# Patient Record
Sex: Female | Born: 1940 | Race: White | Hispanic: No | Smoking: Former smoker
Health system: Southern US, Community
[De-identification: ages and names within clinical notes are randomized; demographics above are authoritative.]

## PROBLEM LIST (undated history)

## (undated) DIAGNOSIS — M1990 Unspecified osteoarthritis, unspecified site: Secondary | ICD-10-CM

## (undated) DIAGNOSIS — B029 Zoster without complications: Secondary | ICD-10-CM

## (undated) DIAGNOSIS — K2210 Ulcer of esophagus without bleeding: Secondary | ICD-10-CM

## (undated) DIAGNOSIS — K219 Gastro-esophageal reflux disease without esophagitis: Secondary | ICD-10-CM

## (undated) DIAGNOSIS — J189 Pneumonia, unspecified organism: Secondary | ICD-10-CM

## (undated) DIAGNOSIS — K2270 Barrett's esophagus without dysplasia: Secondary | ICD-10-CM

## (undated) DIAGNOSIS — K449 Diaphragmatic hernia without obstruction or gangrene: Secondary | ICD-10-CM

## (undated) DIAGNOSIS — R112 Nausea with vomiting, unspecified: Secondary | ICD-10-CM

## (undated) DIAGNOSIS — R319 Hematuria, unspecified: Secondary | ICD-10-CM

## (undated) DIAGNOSIS — I10 Essential (primary) hypertension: Secondary | ICD-10-CM

## (undated) DIAGNOSIS — K900 Celiac disease: Secondary | ICD-10-CM

## (undated) DIAGNOSIS — D649 Anemia, unspecified: Secondary | ICD-10-CM

## (undated) DIAGNOSIS — R7303 Prediabetes: Secondary | ICD-10-CM

## (undated) DIAGNOSIS — I00 Rheumatic fever without heart involvement: Secondary | ICD-10-CM

## (undated) DIAGNOSIS — T4145XA Adverse effect of unspecified anesthetic, initial encounter: Secondary | ICD-10-CM

## (undated) DIAGNOSIS — Z9889 Other specified postprocedural states: Secondary | ICD-10-CM

## (undated) DIAGNOSIS — N321 Vesicointestinal fistula: Secondary | ICD-10-CM

## (undated) DIAGNOSIS — R011 Cardiac murmur, unspecified: Secondary | ICD-10-CM

## (undated) DIAGNOSIS — K5792 Diverticulitis of intestine, part unspecified, without perforation or abscess without bleeding: Secondary | ICD-10-CM

## (undated) HISTORY — PX: TONSILLECTOMY: SUR1361

## (undated) HISTORY — DX: Zoster without complications: B02.9

## (undated) HISTORY — DX: Barrett's esophagus without dysplasia: K22.70

## (undated) HISTORY — PX: CARPAL TUNNEL RELEASE: SHX101

## (undated) HISTORY — PX: APPENDECTOMY: SHX54

## (undated) HISTORY — DX: Diaphragmatic hernia without obstruction or gangrene: K44.9

## (undated) HISTORY — PX: KNEE ARTHROSCOPY: SUR90

## (undated) HISTORY — PX: BUNIONECTOMY: SHX129

---

## 1964-01-11 DIAGNOSIS — T8859XA Other complications of anesthesia, initial encounter: Secondary | ICD-10-CM

## 1964-01-11 HISTORY — DX: Other complications of anesthesia, initial encounter: T88.59XA

## 1998-11-27 ENCOUNTER — Other Ambulatory Visit: Admission: RE | Admit: 1998-11-27 | Discharge: 1998-11-27 | Payer: Self-pay | Admitting: *Deleted

## 1999-04-13 ENCOUNTER — Ambulatory Visit (HOSPITAL_BASED_OUTPATIENT_CLINIC_OR_DEPARTMENT_OTHER): Admission: RE | Admit: 1999-04-13 | Discharge: 1999-04-13 | Payer: Self-pay | Admitting: Orthopedic Surgery

## 1999-04-13 ENCOUNTER — Encounter (INDEPENDENT_AMBULATORY_CARE_PROVIDER_SITE_OTHER): Payer: Self-pay | Admitting: *Deleted

## 2000-07-31 ENCOUNTER — Other Ambulatory Visit: Admission: RE | Admit: 2000-07-31 | Discharge: 2000-07-31 | Payer: Self-pay | Admitting: *Deleted

## 2001-01-18 ENCOUNTER — Encounter: Payer: Self-pay | Admitting: Obstetrics and Gynecology

## 2001-01-18 ENCOUNTER — Encounter: Admission: RE | Admit: 2001-01-18 | Discharge: 2001-01-18 | Payer: Self-pay | Admitting: Obstetrics and Gynecology

## 2001-11-22 ENCOUNTER — Other Ambulatory Visit: Admission: RE | Admit: 2001-11-22 | Discharge: 2001-11-22 | Payer: Self-pay | Admitting: Obstetrics and Gynecology

## 2001-12-04 ENCOUNTER — Encounter: Admission: RE | Admit: 2001-12-04 | Discharge: 2001-12-04 | Payer: Self-pay | Admitting: Obstetrics and Gynecology

## 2001-12-04 ENCOUNTER — Encounter: Payer: Self-pay | Admitting: Obstetrics and Gynecology

## 2002-02-20 ENCOUNTER — Encounter: Payer: Self-pay | Admitting: Gastroenterology

## 2002-04-29 ENCOUNTER — Encounter: Payer: Self-pay | Admitting: *Deleted

## 2002-04-29 ENCOUNTER — Encounter: Admission: RE | Admit: 2002-04-29 | Discharge: 2002-04-29 | Payer: Self-pay | Admitting: *Deleted

## 2002-04-30 ENCOUNTER — Ambulatory Visit (HOSPITAL_BASED_OUTPATIENT_CLINIC_OR_DEPARTMENT_OTHER): Admission: RE | Admit: 2002-04-30 | Discharge: 2002-04-30 | Payer: Self-pay | Admitting: Orthopaedic Surgery

## 2002-11-28 ENCOUNTER — Other Ambulatory Visit: Admission: RE | Admit: 2002-11-28 | Discharge: 2002-11-28 | Payer: Self-pay | Admitting: Obstetrics and Gynecology

## 2002-12-03 ENCOUNTER — Encounter: Admission: RE | Admit: 2002-12-03 | Discharge: 2002-12-03 | Payer: Self-pay | Admitting: Obstetrics and Gynecology

## 2002-12-11 ENCOUNTER — Ambulatory Visit (HOSPITAL_COMMUNITY): Admission: RE | Admit: 2002-12-11 | Discharge: 2002-12-11 | Payer: Self-pay | Admitting: Gastroenterology

## 2002-12-25 ENCOUNTER — Encounter: Payer: Self-pay | Admitting: Gastroenterology

## 2002-12-25 ENCOUNTER — Ambulatory Visit (HOSPITAL_COMMUNITY): Admission: RE | Admit: 2002-12-25 | Discharge: 2002-12-25 | Payer: Self-pay | Admitting: Gastroenterology

## 2003-10-08 ENCOUNTER — Ambulatory Visit (HOSPITAL_COMMUNITY): Admission: RE | Admit: 2003-10-08 | Discharge: 2003-10-08 | Payer: Self-pay | Admitting: Gastroenterology

## 2003-12-22 ENCOUNTER — Encounter: Admission: RE | Admit: 2003-12-22 | Discharge: 2003-12-22 | Payer: Self-pay | Admitting: Obstetrics and Gynecology

## 2003-12-23 ENCOUNTER — Other Ambulatory Visit: Admission: RE | Admit: 2003-12-23 | Discharge: 2003-12-23 | Payer: Self-pay | Admitting: Obstetrics and Gynecology

## 2004-01-27 ENCOUNTER — Encounter: Admission: RE | Admit: 2004-01-27 | Discharge: 2004-01-27 | Payer: Self-pay | Admitting: Obstetrics and Gynecology

## 2004-02-17 ENCOUNTER — Encounter: Admission: RE | Admit: 2004-02-17 | Discharge: 2004-02-17 | Payer: Self-pay | Admitting: Obstetrics and Gynecology

## 2004-03-02 ENCOUNTER — Ambulatory Visit: Payer: Self-pay | Admitting: Gastroenterology

## 2004-03-03 ENCOUNTER — Ambulatory Visit: Payer: Self-pay | Admitting: Gastroenterology

## 2004-12-23 ENCOUNTER — Other Ambulatory Visit: Admission: RE | Admit: 2004-12-23 | Discharge: 2004-12-23 | Payer: Self-pay | Admitting: Obstetrics and Gynecology

## 2005-03-31 ENCOUNTER — Ambulatory Visit: Payer: Self-pay | Admitting: Gastroenterology

## 2005-10-10 ENCOUNTER — Encounter: Payer: Self-pay | Admitting: Gastroenterology

## 2005-11-15 ENCOUNTER — Ambulatory Visit: Payer: Self-pay | Admitting: Gastroenterology

## 2005-12-05 ENCOUNTER — Ambulatory Visit: Payer: Self-pay | Admitting: Gastroenterology

## 2005-12-05 ENCOUNTER — Encounter (INDEPENDENT_AMBULATORY_CARE_PROVIDER_SITE_OTHER): Payer: Self-pay | Admitting: *Deleted

## 2006-01-02 ENCOUNTER — Encounter: Payer: Self-pay | Admitting: Gastroenterology

## 2006-01-24 ENCOUNTER — Emergency Department (HOSPITAL_COMMUNITY): Admission: EM | Admit: 2006-01-24 | Discharge: 2006-01-24 | Payer: Self-pay | Admitting: Emergency Medicine

## 2006-02-08 ENCOUNTER — Inpatient Hospital Stay (HOSPITAL_COMMUNITY): Admission: EM | Admit: 2006-02-08 | Discharge: 2006-02-15 | Payer: Self-pay | Admitting: Emergency Medicine

## 2006-04-19 ENCOUNTER — Encounter: Admission: RE | Admit: 2006-04-19 | Discharge: 2006-04-19 | Payer: Self-pay | Admitting: Family Medicine

## 2007-04-23 ENCOUNTER — Encounter: Admission: RE | Admit: 2007-04-23 | Discharge: 2007-04-23 | Payer: Self-pay | Admitting: Family Medicine

## 2008-04-08 ENCOUNTER — Other Ambulatory Visit: Admission: RE | Admit: 2008-04-08 | Discharge: 2008-04-08 | Payer: Self-pay | Admitting: Family Medicine

## 2008-05-08 ENCOUNTER — Encounter: Admission: RE | Admit: 2008-05-08 | Discharge: 2008-05-08 | Payer: Self-pay | Admitting: Family Medicine

## 2008-12-03 ENCOUNTER — Inpatient Hospital Stay (HOSPITAL_COMMUNITY): Admission: EM | Admit: 2008-12-03 | Discharge: 2008-12-05 | Payer: Self-pay | Admitting: Internal Medicine

## 2008-12-03 ENCOUNTER — Encounter (INDEPENDENT_AMBULATORY_CARE_PROVIDER_SITE_OTHER): Payer: Self-pay | Admitting: *Deleted

## 2008-12-03 ENCOUNTER — Encounter: Payer: Self-pay | Admitting: Emergency Medicine

## 2008-12-03 ENCOUNTER — Ambulatory Visit: Payer: Self-pay | Admitting: Diagnostic Radiology

## 2008-12-16 ENCOUNTER — Telehealth: Payer: Self-pay | Admitting: Gastroenterology

## 2008-12-17 ENCOUNTER — Ambulatory Visit: Payer: Self-pay | Admitting: Gastroenterology

## 2008-12-17 ENCOUNTER — Encounter (INDEPENDENT_AMBULATORY_CARE_PROVIDER_SITE_OTHER): Payer: Self-pay | Admitting: *Deleted

## 2008-12-17 DIAGNOSIS — R933 Abnormal findings on diagnostic imaging of other parts of digestive tract: Secondary | ICD-10-CM

## 2008-12-17 DIAGNOSIS — K2270 Barrett's esophagus without dysplasia: Secondary | ICD-10-CM

## 2008-12-17 DIAGNOSIS — K5732 Diverticulitis of large intestine without perforation or abscess without bleeding: Secondary | ICD-10-CM

## 2008-12-17 LAB — CONVERTED CEMR LAB: IgA: 244 mg/dL (ref 68–378)

## 2008-12-19 LAB — CONVERTED CEMR LAB: Tissue Transglutaminase Ab, IgA: 0.5 units (ref <7)

## 2009-01-16 ENCOUNTER — Ambulatory Visit: Payer: Self-pay | Admitting: Gastroenterology

## 2009-01-20 ENCOUNTER — Encounter: Payer: Self-pay | Admitting: Gastroenterology

## 2009-08-20 ENCOUNTER — Encounter: Admission: RE | Admit: 2009-08-20 | Discharge: 2009-08-20 | Payer: Self-pay | Admitting: Family Medicine

## 2010-02-09 NOTE — Procedures (Signed)
Summary: Upper Endoscopy  Patient: [name] Note: All result statuses are Final unless otherwise noted.  Tests: (1) Upper Endoscopy (EGD)   EGD Upper Endoscopy       [facility].     [place], [place]  [id number]           ENDOSCOPY PROCEDURE REPORT           PATIENT:  Kara, Velazquez  MR#:  [id number]     BIRTHDATE:  1940-04-21, 34 yrs. old  GENDER:  female           ENDOSCOPIST:  Norberto Sorenson T. Fuller Plan, MD, [facility]           PROCEDURE DATE:  01/16/2009     PROCEDURE:  EGD with biopsy     ASA CLASS:  Class II     INDICATIONS:  h/o Barrett's Esophagus           MEDICATIONS:  There was residual sedation effect present from     prior procedure, Versed 3 mg IV     TOPICAL ANESTHETIC:  Exactacain Spray           DESCRIPTION OF PROCEDURE:   After the risks benefits and     alternatives of the procedure were thoroughly explained, informed     consent was obtained.  The LB GIF-H180 [id number] endoscope was     introduced through the mouth and advanced to the second portion of     the duodenum, without limitations.  The instrument was slowly     withdrawn as the mucosa was fully examined.     <<PROCEDUREIMAGES>>           Barrett's esophagus was found in the distal esophagus.  It     measured 1-2 cm in length. The duodenal bulb was normal in     appearance, as was the postbulbar duodenum.  The stomach was     entered and closely examined. The pylorus, antrum, angularis, and     lesser curvature were well visualized, including a retroflexed     view of the cardia and fundus. The stomach wall was normally     distensable. The scope passed easily through the pylorus into the     duodenum.  Retroflexed views revealed a hiatal hernia, small.  The     scope was then withdrawn from the patient and the procedure     completed.           COMPLICATIONS:  None           ENDOSCOPIC IMPRESSION:     1) Barrett's esophagus     2) Small hiatal hernia        RECOMMENDATIONS:     1) anti-reflux regimen     2) await pathology results     3) continue PPI     4) Repeat endoscopy in 3 years if no dysplasia           Ozias Dicenzo T. Fuller Plan, MD, [facility]           CC:  Cari Caraway, MD           n.     Lorrin MaisPricilla Riffle. Aleea Hendry at 01/16/2009 03:19 PM           [name], [phone]  Note: An exclamation mark (!) indicates a result that was not dispersed into the flowsheet. Document Creation Date: 01/16/2009 3:19 PM _______________________________________________________________________  Marland Kitchen  1) Order result status: Final Collection or observation date-time: 01/16/2009 15:09 Requested date-time:  Receipt date-time:  Reported date-time:  Referring Physician:   Ordering Physician: Lucio Edward [phone]) Specimen Source:  Source: Tawanna Cooler Order Number: [id number] Lab site:   Appended Document: Upper Endoscopy     Procedures Next Due Date:    EGD: 01/2012

## 2010-02-09 NOTE — Letter (Signed)
Summary: Patient Notice-Endo Biopsy Results  [facility]  [place], [place] [address]   Phone: [phone]  Fax: [phone]        January 20, 2009 MRN: [id number]    [facility] [place], [place]  [address]    Dear [name],  I am pleased to inform you that the biopsies taken during your recent endoscopic examination did not show any evidence of cancer upon pathologic examination. The biopsies show mild inflammation.  Continue with the treatment plan as outlined on the day of your      exam.  You should have a repeat endoscopic examination for this problem              in 3 years.  Please call us if you are having persistent problems or have questions about your condition that have not been fully answered at this time.  Sincerely,  Ladene Artist MD [facility]  This letter has been electronically signed by your physician.  Appended Document: Patient Notice-Endo Biopsy Results Letter mailed 01.12.11

## 2010-02-09 NOTE — Procedures (Signed)
Summary: Colonoscopy  Patient: [name] Note: All result statuses are Final unless otherwise noted.  Tests: (1) Colonoscopy (COL)   COL Colonoscopy           DONE (C)     [facility].     [place],   [id number]           COLONOSCOPY PROCEDURE REPORT           PATIENT:  [name], [name]  MR#:  [id number]     BIRTHDATE:  10-18-1940, 68 yrs. old  GENDER:  female           ENDOSCOPIST:  Norberto Sorenson T. Fuller Plan, MD, [place]           PROCEDURE DATE:  01/16/2009     PROCEDURE:  Colonoscopy [address]     ASA CLASS:  Class II     INDICATIONS:  1) abnormal CT of abdomen  2) diverticulitis           MEDICATIONS:   Fentanyl 100 mcg IV, Versed 7 mg IV           DESCRIPTION OF PROCEDURE:   After the risks benefits and     alternatives of the procedure were thoroughly explained, informed     consent was obtained.  Digital rectal exam was performed and     revealed no abnormalities.   The LB [id number]-[id number] endoscope     was introduced through the anus and advanced to the cecum, which     was identified by both the appendix and ileocecal valve, without     limitations.  The quality of the prep was excellent, using     MoviPrep.  The instrument was then slowly withdrawn as the colon     was fully examined.     <<PROCEDUREIMAGES>>           FINDINGS:  Moderate diverticulosis was found in the sigmoid colon     with haustral thickening, spasm and mild narrowing  A normal     appearing cecum, ileocecal valve, and appendiceal orifice were     identified. The ascending, hepatic flexure, transverse, splenic     flexure, descending colon, and rectum appeared unremarkable.     Retroflexed views in the rectum revealed no abnormalities. The     time to cecum =  3  minutes. The scope was then withdrawn (time =     10  min) from the patient and the procedure completed.           COMPLICATIONS:  None           ENDOSCOPIC IMPRESSION:     1) Moderate diverticulosis in the sigmoid  colon     2) Normal colon           RECOMMENDATIONS:     1) high fiber diet     2) Continue current colorectal screening recommendations for     "routine risk" patients with a repeat colonoscopy in 10 years.           Pricilla Riffle. Fuller Plan, MD, [facility]           CC: Cari Caraway, MD           n.     REVISED:  01/20/2009 04:07 PM     eSIGNED:   Norberto Sorenson T. Riot Barrick at 01/20/2009 04:07 PM           [name], [phone]  Note: An exclamation mark Marland Kitchen)  indicates a result that was not dispersed into the flowsheet. Document Creation Date: 01/20/2009 4:08 PM _______________________________________________________________________  (1) Order result status: Final Collection or observation date-time: 01/16/2009 15:00 Requested date-time:  Receipt date-time:  Reported date-time:  Referring Physician:   Ordering Physician: Lucio Edward [phone]) Specimen Source:  Source: Tawanna Cooler Order Number: [id number] Lab site:   Appended Document: Colonoscopy     Procedures Next Due Date:    Colonoscopy: 01/2019

## 2010-04-14 LAB — URINALYSIS, MICROSCOPIC ONLY
Bilirubin Urine: NEGATIVE
Glucose, UA: NEGATIVE mg/dL
Nitrite: NEGATIVE
Specific Gravity, Urine: 1.041 — ABNORMAL HIGH (ref 1.005–1.030)
pH: 6 (ref 5.0–8.0)

## 2010-04-14 LAB — URINE CULTURE: Special Requests: NEGATIVE

## 2010-04-14 LAB — DIFFERENTIAL
Basophils Relative: 0 % (ref 0–1)
Eosinophils Absolute: 0 10*3/uL (ref 0.0–0.7)
Lymphs Abs: 0.8 10*3/uL (ref 0.7–4.0)
Monocytes Absolute: 0.8 10*3/uL (ref 0.1–1.0)
Monocytes Relative: 7 % (ref 3–12)
Neutrophils Relative %: 85 % — ABNORMAL HIGH (ref 43–77)

## 2010-04-14 LAB — COMPREHENSIVE METABOLIC PANEL
ALT: 13 U/L (ref 0–35)
Albumin: 3.1 g/dL — ABNORMAL LOW (ref 3.5–5.2)
Alkaline Phosphatase: 81 U/L (ref 39–117)
Calcium: 8.3 mg/dL — ABNORMAL LOW (ref 8.4–10.5)
GFR calc Af Amer: >60 mL/min (ref >60)
Glucose, Bld: 98 mg/dL (ref 70–99)
Potassium: 3.3 mEq/L — ABNORMAL LOW (ref 3.5–5.1)
Sodium: 136 mEq/L (ref 135–145)
Total Protein: 5.9 g/dL — ABNORMAL LOW (ref 6.0–8.3)

## 2010-04-14 LAB — CBC
HCT: 33.4 % — ABNORMAL LOW (ref 36.0–46.0)
Hemoglobin: 11.5 g/dL — ABNORMAL LOW (ref 12.0–15.0)
MCHC: 34.4 g/dL (ref 30.0–36.0)
MCV: 92.5 fL (ref 78.0–100.0)
Platelets: 220 10*3/uL (ref 150–400)
Platelets: 231 10*3/uL (ref 150–400)
RBC: 3.61 MIL/uL — ABNORMAL LOW (ref 3.87–5.11)
RDW: 13.5 % (ref 11.5–15.5)
RDW: 13.5 % (ref 11.5–15.5)
WBC: 11.4 10*3/uL — ABNORMAL HIGH (ref 4.0–10.5)
WBC: 7.9 10*3/uL (ref 4.0–10.5)

## 2010-04-14 LAB — BASIC METABOLIC PANEL
GFR calc Af Amer: >60 mL/min (ref >60)
GFR calc non Af Amer: >60 mL/min (ref >60)
Potassium: 3.2 mEq/L — ABNORMAL LOW (ref 3.5–5.1)
Sodium: 138 mEq/L (ref 135–145)

## 2010-04-14 LAB — LIPID PANEL
LDL Cholesterol: 56 mg/dL (ref 0–99)
Total CHOL/HDL Ratio: 1.9 RATIO
Triglycerides: 36 mg/dL (ref <150)
VLDL: 7 mg/dL (ref 0–40)

## 2010-04-14 LAB — HEMOGLOBIN A1C
Hgb A1c MFr Bld: 6.3 % — ABNORMAL HIGH (ref 4.6–6.1)
Mean Plasma Glucose: 134 mg/dL

## 2010-04-14 LAB — PHOSPHORUS: Phosphorus: 2.9 mg/dL (ref 2.3–4.6)

## 2010-05-28 NOTE — Discharge Summary (Signed)
[name], [name]                   ACCOUNT NO.:  [id number]   MEDICAL RECORD NO.:  [id number]          PATIENT TYPE:  INP   LOCATION:  1336                         FACILITY:  [facility]   PHYSICIAN:  Sheila Oats, M.D.DATE OF BIRTH:  August 15, 1940   DATE OF ADMISSION:  02/08/2006  DATE OF DISCHARGE:  02/15/2006                               DISCHARGE SUMMARY   DISCHARGE DIAGNOSES:  1. Bronchopneumonia, bilateral.  2. Malignant hypertension.  3. Hypokalemia.  4. Gastroesophageal reflux disease.  5. History of hypercholesterolemia.  6. History of celiac disease.   HISTORY OF PRESENT ILLNESS:  The patient is a pleasant, 70 year old,  white female with past medical history significant for GERD, celiac  disease, hypercholesterolemia and hypertension who presented with  complaints of cough with fever and vomiting.  She stated that she was in  her usual state of health until 4 days prior to admission when she began  coughing.  She also reported about 1-2 days prior to admission, she  developed fevers along with vomiting.  On the day prior to admission,  she went to the [facility] and was given oral antibiotics and  cough medications, but she was unable to keep her medications down.  Also on the morning of admission, she had a temperature of 103 and came  to the ER.  She also admitted to shortness of breath as well as URI  symptoms x1 day.   In the ER, the patient had a chest x-ray done which revealed findings  consistent with bronchopneumonia and an electrolyte imbalance was noted  on her chemistries.  She was admitted to the [facility] hospitalist service  for evaluation and management.   PHYSICAL EXAMINATION:  GENERAL:  An elderly, white female acutely ill-  appearing in no respiratory distress.  VITAL SIGNS:  Temperature 101.4 with a blood pressure 142/71, pulse 96,  respirations 16, O2 saturations 98%.  LUNGS:  Moderate air movement.  Bronchial breath sounds at the bases  with no wheezes.   The rest of her physical exam was reported to be within normal limits.   LABORATORY DATA AND X-RAY FINDINGS:  Chest x-ray showed peribronchial  thickening with streaking bilateral infiltrates, probable  bronchopneumonia per radiologist.   Her white cell count 5.5 with a hemoglobin of 10.6, hematocrit 30.7 and  platelet count of 186, neutrophil count 93%.  Sodium 134, potassium 3.9,  chloride 104, CO2 24, glucose 120, BUN 14, creatinine 0.8, calcium 8.4.  Total protein 5.8, albumin 3.5, AST 21, amylase 42, lipase 12.   HOSPITAL COURSE:  Problem 1.  BRONCHOPNEUMONIA, BILATERAL:  Upon  admission, the patient had blood cultures done and was empirically  placed on IV antibiotics.  By the next hospital day, the patient was  still febrile, so her antibiotics were adjusted to include coverage for  typical organisms.  The patient was also placed on antitussives while in  the hospital.  The patient's blood cultures did not grow any organisms.  She subsequently defervesced and her symptoms gradually improved with  shortness of breath resolved and her cough improved.  She  developed  diarrhea while on the antibiotics and stool studies were sent.  The  Clostridium difficile was negative and stool cultures of no growth up-to-  date.  The patient was initially placed on Questran for symptomatic  relief of the diarrhea and Lactinex was subsequently added.  The patient  has remained afebrile.  She is tolerating a p.o. diet well and she will  be discharged on oral antibiotics to follow up with her primary care  physician.   Problem 2.  MALIGNANT HYPERTENSION:  Upon admission, the patient was  restarted on her Toprol.  She indicated that she had previously also had  been on Avalide, but that was discontinued because it was thought that  it made her hypotensive.  Her blood pressures were monitored while she  was in the hospital and they were noted to be repeatedly elevated  reaching a  maximum of 206/102 which required some IV antihypertensives.  She was then started back on the Avalide initially the 150/12.5 mg dose.  The patient's blood pressures continued to be uncontrolled even with  this and so the dose was subsequently increased to the 300/12.5 mg dose.  She will be monitored overnight and if her blood pressure control  remains adequate, she will be discharged on her Toprol 50 mg daily as  well as the Avalide 300/12.5 mg daily.  She is to follow up with her  primary care physician for further blood pressure monitoring and  adjustments of her blood pressures medications for optimal blood  pressure control.   Problem 3.  HYPOKALEMIA:  Her potassium was replaced during her hospital  stay.   Problem 4.  HYPERCHOLESTEROLEMIA:  The patient was maintained on Zocor  during her hospital stay.   Problem 5.  HISTORY OF CELIAC DISEASE:  The patient was placed on a  gluten-free diet during her hospital stay.   Problem 6.  GASTROESOPHAGEAL REFLUX DISEASE:  The patient was maintained  on a PPI during her hospital stay.   DISCHARGE MEDICATIONS:  1. Augmentin 875 mg p.o. b.i.d. x1 week.  2. Avalide 300/12.5 mg p.o. q.a.m.  3. Toprol XL 50 mg p.o. q.p.m.  4. Tessalon Perles 100 mg p.o. t.i.d. p.r.n.  5. Lactinex one p.o. b.i.d.  6. Questran 4 mg p.o. b.i.d. p.r.n.  7. Tussionex 5 mL p.o. q.12 h. p.r.n. cough.  8. Continue preadmission medications as instructed.   FOLLOW UP:  Dr. Leitha Bleak in 1 week.   CONDITION ON DISCHARGE:  Stable.      Sheila Oats, M.D.  Electronically Signed     ACV/MEDQ  D:  02/14/2006  T:  02/14/2006  Job:  [id number]

## 2010-05-28 NOTE — Assessment & Plan Note (Signed)
[facility]                           GASTROENTEROLOGY OFFICE NOTE   CARLENE, BICKLEY                          MRN:          [id number]  DATE:11/15/2005                            DOB:          04-20-1940    Ms. Reader returns complaining of episodic diarrhea.  She states that about  once a month she will have cramping lower abdominal pain with watery  diarrhea.  She occasionally has vomiting.  She notes lower abdominal  discomfort, which may last for several days following a bout of diarrhea and  vomiting.  She had a similar episode last week, which was associated with  small amounts of mucus and blood per rectum, which was unusual for her.  Her  reflux symptoms appear to be well-controlled on Protonix 40 mg b.i.d.  She  notes no change in stool caliber, weight loss or rectal pain.   Current medications listed on the chart-updated & reviewed.   MEDICATION ALLERGIES:  MOTRIN, ACE INHIBITORS, LEVAQUIN, CIPRO.   PHYSICAL EXAMINATION:  GENERAL:  No acute distress.  VITAL SIGNS:  Weight 150 pounds.  Blood pressure is 142/70, pulse 80 and  regular.  HEENT:  Anicteric sclerae.  Oropharynx clear.  CHEST:  Clear to auscultation bilaterally.  CARDIAC:  Regular rate and rhythm without murmurs.  ABDOMEN:  Soft.  Minimal lower abdominal tenderness to deep palpation, no  rebound or guarding.  No palpable organomegaly, masses or hernias.  Normoactive bowel sounds.   ASSESSMENT AND PLAN:  1. Presumed irritable bowel syndrome with likely a benign anorectal source      of bleeding.  Most recent colonoscopy in February 2004 showing only      diverticulosis.  Need to exclude colitis, proctitis and colorectal      neoplasms.  She is to maintain a high-fiber diet with adequate fluid      intake.  She is encouraged to use Robinul Forte b.i.d. prn more      frequently.  She uses it very infrequently at this time.  Risks,      benefits, and alternatives of colonoscopy  with possible biopsy,      possible polypectomy and possible destruction of internal hemorrhoids      discussed with the patient, and she consents to proceed.  This will be      scheduled electively.  2. Gastroesophageal reflux disease with a history of erosive esophagitis.      Maintain Protonix 40 mg b.i.d. along with standard antireflux measures.     Pricilla Riffle. Fuller Plan, MD, [facility]  Electronically Signed    MTS/MedQ  DD: 11/16/2005  DT: 11/17/2005  Job #: [id number]

## 2010-05-28 NOTE — Op Note (Signed)
NAME:  LYNNELL, FIUMARA                             ACCOUNT NO.:  [id number]   MEDICAL RECORD NO.:  [id number]                   PATIENT TYPE:  AMB   LOCATION:  DSC                                  FACILITY:  [facility]   PHYSICIAN:  Vonna Kotyk. Durward Fortes, M.D.            DATE OF BIRTH:  06/18/1940   DATE OF PROCEDURE:  04/30/2002  DATE OF DISCHARGE:                                 OPERATIVE REPORT   PREOPERATIVE DIAGNOSIS:  1. Tear lateral meniscus, right knee.  2. 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   POSTOPERATIVE DIAGNOSIS:  1. Tear lateral meniscus, right knee.  2. 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   OPERATION PERFORMED:  1. Arthroscopic partial lateral meniscectomy.  2. Arthroscopic shaving of patellofemoral joint, medial compartment and     lateral compartment.   SURGEON:  Vonna Kotyk. Durward Fortes, M.D.   ANESTHESIA:  IV sedation, local 1% Xylocaine with epinephrine.   COMPLICATIONS:  None.   INDICATIONS FOR PROCEDURE:  The patient is a 70 year old female who was  visiting family in [place] this weekend.  She was holding a grandchild and  she had a twisting injury to her right knee.  She was uncomfortable  throughout her drive home, was seen by a primary care physician at the end  of the weekend who suspected a tear of the lateral meniscus.  An MRI scan  was performed yesterday morning with evidence of a displaced tear of the  lateral meniscus.  She was seen in the office yesterday afternoon confirming  the diagnosis by MR scan with inability to fully extend the knee with a  positive effusion with lateral joint pain.  She is now to have arthroscopic  evaluation.   DESCRIPTION OF PROCEDURE:  With the patient comfortable on the operating  table and under IV sedation, the  right lower extremity was placed in a thigh  holder.  The leg was then prepped with DuraPrep from the thigh holder to the  ankle.  Sterile draping was performed.  Arthroscopic portals were  established medially and laterally.  The knee had been prepped, had been  blocked  for the arthroscopic portals preoperatively.   There was a minimal effusion.  Diagnostic arthroscopy revealed obliteration  of the lateral compartment with a displaced tear of the lateral meniscus  extending from about the junction of the posterior and middle thirds all the  way to the anterior horn.  There was bleeding in the fatty tissue  anteriorly.  I inserted an ArthroCare wand to obtain hemostasis.  I then  removed the meniscus from its torn portion along the anterior horn through  its posterior attachment at the juncture of the middle and posterior thirds.  I then carefully probed the remaining rim.  I did not see any evidence of  further displaced fragments.  I carefully coagulated any bleeding areas with  the ArthroCare wand.  There was still some fraying of the posterior third of  the meniscus and this was carefully removed leaving the majority of the  posterior third.  There was chondromalacia of the femoral condyle more so  than on the lateral tibial plateau and the gutter was clear without evidence  of any displaced fragments.   There were osteoarthritic spurs along the lateral femoral condyle.  The ACL  appeared to be intact.  The medial compartment revealed loose articular  cartilage in the mid weightbearing surface of the femoral condyle.  This was  debrided and then heat stabilized with the ArthroCare wand.  The meniscus  appeared to be intact except for some fraying of the very anterior horn of  the medial meniscus.  The superior pouch revealed mild synovitis.  There was  considerable chondromalacia of the patella mostly along all facets.  There  were areas of exposed subchondral bone and any loose  edges were shaved.  The  joint was then copiously irrigated with saline solution.  I did establish a  third portal for arthroscopic lavage.  All three were infiltrated with 0.25%  Marcaine with epinephrine and left open.  Sterile bulky dressing was applied  followed by an Ace bandage.   PLAN:  She takes an aspirin a day, Percocet for pain, office one week.                                               Vonna Kotyk. Durward Fortes, M.D.    PWW/MEDQ  D:  04/30/2002  T:  04/30/2002  Job:  [id number]

## 2010-05-28 NOTE — H&P (Signed)
NAMETYLIN, STRADLEY                   ACCOUNT NO.:  [id number]   MEDICAL RECORD NO.:  [id number]          PATIENT TYPE:  EMS   LOCATION:  ED                           FACILITY:  [facility]   PHYSICIAN:  Sheila Oats, M.D.DATE OF BIRTH:  [birth year]   DATE OF ADMISSION:  02/08/2006  DATE OF DISCHARGE:                              HISTORY & PHYSICAL   PRIMARY CARE PHYSICIAN:  Dr. Cari Caraway   CHIEF COMPLAINT:  Cough with fever and vomiting.   HISTORY OF PRESENT ILLNESS:  The patient is a pleasant 70 year old white  female with past medical history significant for GERD, celiac disease,  hypercholesterolemia and hypertension who presents with the above  complaints.  She states that she was in her usual state of health until  about 4 days ago when she began coughing.  She reports that initially  the cough was nonproductive, but for the past 1-2 just productive of  clear phlegm.  She states that on the day prior to admission she began  vomiting and also developed fevers.  They went to the [facility] the day prior to admission and she was given oral amoxicillin and  cough medication but she was unable to keep the medications down  secondary to the vomiting and also had a temperature up to 103 this  morning, and therefore came to the ER.  The patient also admits to  shortness of breath x1 day as well as URI symptoms including a sore  throat.  She denies PND, leg swelling, chest pain, dysuria, diarrhea,  hematochezia, and no melena.   The patient was seen in the ER and a chest x-ray showed findings  consistent with bronchopneumonia and her chemistries revealed  electrolyte imbalances.  She is admitted to the [facility] for further evaluation and management.   PAST MEDICAL HISTORY:  As stated above.   MEDICATIONS:  Protonix b.i.d., simvastatin, Toprol-XL, Amoxi, and  Hydromet.  The patient does not know the doses of any of her  medications.   ALLERGIES:   LEVAQUIN, MOTRIN, ACE INHIBITORS.  She also has food  allergies including GLUTEN, WHEAT, and MILK.   SOCIAL HISTORY:  She quit tobacco about 45 years ago, smoked about 4  years while in college.  She denies alcohol.   FAMILY HISTORY:  Mother had hypertension and stroke.  Father had an MI  at age 37.   REVIEW OF SYSTEMS:  As per HPI.  Other review of systems negative.   PHYSICAL EXAMINATION:  GENERAL:  The patient is an elderly white female,  acutely-ill-appearing, in no respiratory distress.  VITAL SIGNS:  Temperature 101.4, blood pressure 142/71, pulse of 96,  respiratory rate of 16, O2 saturation of 98%.  HEENT:  PERRL, EOMI, slightly dry mucous membranes.  No oral exudates.  Sclerae anicteric.  NECK:  Supple, no adenopathy, no JVD, and no thyromegaly.  LUNGS:  Moderate air movement, bronchial breath sounds at the bases, no  wheezes.  CARDIOVASCULAR:  Regular rate and rhythm.  Normal S1, S2.  ABDOMEN:  Mild diffuse tenderness, no rebound,  bowel sounds present,  nondistended, no organomegaly and no masses palpable.  EXTREMITIES:  No cyanosis and no edema.  NEUROLOGIC:  She is alert and oriented x3.  Cranial nerves II-XII  grossly intact.  Nonfocal exam.   LABORATORY DATA:  The chest x-ray showed peribronchial thickening with  streaky bilateral infiltrates - probable bronchopneumonia.  White cell  count is 5.5, hemoglobin of 10.6, hematocrit of 30.7, platelet count  186, neutrophil count 93%.  Sodium is 134 with a potassium of 3.9,  chloride 104, CO2 is 24, glucose 120, BUN 14, creatinine 0.8, calcium  8.4, total protein 5.8, albumin 3.5, AST is 21, amylase is 42, lipase  12.   ASSESSMENT AND PLAN:  1. Bronchopneumonia, bilateral.  Will obtain blood cultures, place on      empiric antibiotics and antitussives as well as supportive care.  2. Gastroesophageal reflux disease.  Continue PPI.  3. Hypertension.  Monitor blood pressures, continue Toprol-XL.  4. Hypercholesterolemia.   Continue Zocor.  5. Hypokalemia.  Replace potassium.  6. History of celiac disease.  Continue gluten-sensitive diet.      Sheila Oats, M.D.  Electronically Signed     ACV/MEDQ  D:  02/08/2006  T:  02/08/2006  Job:  [id number]   cc:   Leitha Bleak, M.D.  Fax: [phone]

## 2010-05-28 NOTE — Op Note (Signed)
[facility]  Patient:    [name], [name]                            MRN: [id number] Proc. Date: 04/13/99 Attending:  Wynonia Sours, M.D. CC:         Wynonia Sours, M.D. (2)                           Operative Report  PREOPERATIVE DIAGNOSES:  1. Carpal tunnel syndrome, left arm.  2. Mass, left forearm.  POSTOPERATIVE DIAGNOSES:  1. Carpal tunnel syndrome, left arm.  2. Mass, left forearm.  OPERATION:  1. Excision of mass.  2. Release of carpal tunnel.  SURGEON:  Wynonia Sours, M.D.  ASSISTANT:  Odessa Fleming, R.N.  ANESTHESIA:  Axillary block.  ANESTHESIOLOGIST:  Finis Bud, M.D.  HISTORY:  The patient is a 70 year old female with a history of a carpal tunnel  syndrome, EMG nerve conduction is positive, for which has not responded to conservative treatment, and mass enlarging on the left forearm, ulnar aspect.  DESCRIPTION OF PROCEDURE:  The patient was brought to the operating room where n axillary block was carried out without difficulty.  She was prepped and draped using Betadine scrub and solution, with the left arm free.  The limb was exsanguinated with an Esmarch bandage.  Tourniquet placed high on the arm was inflated to 250 mmHg.  A longitudinal incision was made in the palm, carried down through subcutaneous tissue.  Bleeders were electrocauterized.  Palmar fascia was split.  The superficial palmar arch identified.  The flexor tendon of the right and little finger identified to the ulnar side of the medial nerve.  The carpal retinaculum was incised with sharp dissection.  A right-angle and Sewall retractor were placed between skin and forearm fascia.  The fascia was released for approximately 3 cm proximal to the wrist crease under direct vision.  No further lesions were identified.  The wound was irrigated.  Skin closed with interrupted 5-0 nylon sutures.  A separate incision was then made over the mass, longitudinally.   This was over the distal third of the ulna, carried down through subcutaneous tissue.  A multilobulated, tannish-brown, mass was immediately apparent.  With blunt and sharp dissection this was dissected free.  It was found to be well encapsulated. Vessels leading into it were coagulated, the wound irrigated.  The skin was closed with  interrupted 5-0 nylon sutures.  Sterile compressing dressing and splint was applied.  The patient tolerated the procedure well and was taken to the recovery room for  observation in satisfactory condition.  She is discharged home to return to [place] in one week, n Vicodin and Keflex. DD:  04/13/99 TD:  04/13/99 Job: [id number]

## 2010-11-03 ENCOUNTER — Other Ambulatory Visit: Payer: Self-pay | Admitting: Family Medicine

## 2010-11-03 DIAGNOSIS — Z1231 Encounter for screening mammogram for malignant neoplasm of breast: Secondary | ICD-10-CM

## 2010-11-10 ENCOUNTER — Ambulatory Visit: Payer: Self-pay

## 2010-11-12 ENCOUNTER — Ambulatory Visit
Admission: RE | Admit: 2010-11-12 | Discharge: 2010-11-12 | Disposition: A | Discharge status: Home / Self Care | Payer: Medicare Other | Source: Ambulatory Visit | Attending: Family Medicine | Admitting: Family Medicine

## 2010-11-12 DIAGNOSIS — Z1231 Encounter for screening mammogram for malignant neoplasm of breast: Secondary | ICD-10-CM

## 2010-12-21 ENCOUNTER — Ambulatory Visit: Payer: Medicare Other | Attending: Family Medicine | Admitting: Physical Therapy

## 2010-12-21 DIAGNOSIS — M545 Low back pain, unspecified: Secondary | ICD-10-CM | POA: Insufficient documentation

## 2010-12-21 DIAGNOSIS — IMO0001 Reserved for inherently not codable concepts without codable children: Secondary | ICD-10-CM | POA: Insufficient documentation

## 2010-12-21 DIAGNOSIS — M25559 Pain in unspecified hip: Secondary | ICD-10-CM | POA: Insufficient documentation

## 2010-12-21 DIAGNOSIS — M25519 Pain in unspecified shoulder: Secondary | ICD-10-CM | POA: Insufficient documentation

## 2010-12-23 ENCOUNTER — Ambulatory Visit: Payer: Medicare Other | Admitting: Physical Therapy

## 2010-12-29 ENCOUNTER — Ambulatory Visit: Payer: Medicare Other | Admitting: Physical Therapy

## 2010-12-31 ENCOUNTER — Ambulatory Visit: Payer: Medicare Other | Admitting: Physical Therapy

## 2011-01-06 ENCOUNTER — Encounter: Payer: Medicare Other | Admitting: Physical Therapy

## 2011-01-10 ENCOUNTER — Ambulatory Visit: Payer: Medicare Other | Admitting: Physical Therapy

## 2011-01-13 ENCOUNTER — Ambulatory Visit: Payer: Medicare Other | Attending: Family Medicine | Admitting: Physical Therapy

## 2011-01-13 DIAGNOSIS — M25519 Pain in unspecified shoulder: Secondary | ICD-10-CM | POA: Insufficient documentation

## 2011-01-13 DIAGNOSIS — M545 Low back pain, unspecified: Secondary | ICD-10-CM | POA: Insufficient documentation

## 2011-01-13 DIAGNOSIS — M25559 Pain in unspecified hip: Secondary | ICD-10-CM | POA: Insufficient documentation

## 2011-01-13 DIAGNOSIS — IMO0001 Reserved for inherently not codable concepts without codable children: Secondary | ICD-10-CM | POA: Insufficient documentation

## 2011-01-17 ENCOUNTER — Ambulatory Visit: Payer: Medicare Other | Admitting: Physical Therapy

## 2011-01-20 ENCOUNTER — Encounter: Payer: Medicare Other | Admitting: Physical Therapy

## 2011-01-31 ENCOUNTER — Inpatient Hospital Stay (HOSPITAL_COMMUNITY)
Admission: EM | Admit: 2011-01-31 | Discharge: 2011-02-04 | DRG: 758 | Disposition: A | Discharge status: Home / Self Care | Payer: Medicare Other | Attending: Internal Medicine | Admitting: Internal Medicine

## 2011-01-31 ENCOUNTER — Encounter (HOSPITAL_COMMUNITY): Payer: Self-pay | Admitting: Emergency Medicine

## 2011-01-31 ENCOUNTER — Emergency Department (HOSPITAL_COMMUNITY): Payer: Medicare Other

## 2011-01-31 DIAGNOSIS — K5732 Diverticulitis of large intestine without perforation or abscess without bleeding: Secondary | ICD-10-CM

## 2011-01-31 DIAGNOSIS — R933 Abnormal findings on diagnostic imaging of other parts of digestive tract: Secondary | ICD-10-CM

## 2011-01-31 DIAGNOSIS — I10 Essential (primary) hypertension: Secondary | ICD-10-CM | POA: Diagnosis present

## 2011-01-31 DIAGNOSIS — E871 Hypo-osmolality and hyponatremia: Secondary | ICD-10-CM | POA: Diagnosis present

## 2011-01-31 DIAGNOSIS — N83209 Unspecified ovarian cyst, unspecified side: Secondary | ICD-10-CM | POA: Diagnosis present

## 2011-01-31 DIAGNOSIS — K2270 Barrett's esophagus without dysplasia: Secondary | ICD-10-CM

## 2011-01-31 DIAGNOSIS — E876 Hypokalemia: Secondary | ICD-10-CM | POA: Diagnosis present

## 2011-01-31 DIAGNOSIS — R1032 Left lower quadrant pain: Secondary | ICD-10-CM | POA: Diagnosis present

## 2011-01-31 DIAGNOSIS — D72829 Elevated white blood cell count, unspecified: Secondary | ICD-10-CM | POA: Diagnosis present

## 2011-01-31 DIAGNOSIS — N7093 Salpingitis and oophoritis, unspecified: Principal | ICD-10-CM | POA: Diagnosis present

## 2011-01-31 HISTORY — DX: Unspecified osteoarthritis, unspecified site: M19.90

## 2011-01-31 HISTORY — DX: Essential (primary) hypertension: I10

## 2011-01-31 HISTORY — DX: Celiac disease: K90.0

## 2011-01-31 HISTORY — DX: Cardiac murmur, unspecified: R01.1

## 2011-01-31 HISTORY — DX: Diverticulitis of intestine, part unspecified, without perforation or abscess without bleeding: K57.92

## 2011-01-31 HISTORY — DX: Ulcer of esophagus without bleeding: K22.10

## 2011-01-31 HISTORY — DX: Gastro-esophageal reflux disease without esophagitis: K21.9

## 2011-01-31 LAB — CBC
HCT: 36.2 % (ref 36.0–46.0)
MCH: 30.7 pg (ref 26.0–34.0)
MCV: 88.1 fL (ref 78.0–100.0)
RDW: 13.2 % (ref 11.5–15.5)
WBC: 16.3 10*3/uL — ABNORMAL HIGH (ref 4.0–10.5)

## 2011-01-31 LAB — URINE MICROSCOPIC-ADD ON

## 2011-01-31 LAB — DIFFERENTIAL
Basophils Absolute: 0 10*3/uL (ref 0.0–0.1)
Eosinophils Relative: 0 % (ref 0–5)
Lymphocytes Relative: 10 % — ABNORMAL LOW (ref 12–46)
Lymphs Abs: 1.6 10*3/uL (ref 0.7–4.0)
Monocytes Absolute: 1.3 10*3/uL — ABNORMAL HIGH (ref 0.1–1.0)

## 2011-01-31 LAB — URINALYSIS, ROUTINE W REFLEX MICROSCOPIC
Nitrite: NEGATIVE
Specific Gravity, Urine: 1.013 (ref 1.005–1.030)
pH: 5.5 (ref 5.0–8.0)

## 2011-01-31 LAB — BASIC METABOLIC PANEL
CO2: 25 mEq/L (ref 19–32)
Creatinine, Ser: 0.85 mg/dL (ref 0.50–1.10)
GFR calc Af Amer: 77 mL/min — ABNORMAL LOW (ref >90)
GFR calc non Af Amer: 67 mL/min — ABNORMAL LOW (ref >90)
Glucose, Bld: 128 mg/dL — ABNORMAL HIGH (ref 70–99)

## 2011-01-31 LAB — URINE CULTURE: Culture  Setup Time: 201301220118

## 2011-01-31 MED ORDER — SODIUM CHLORIDE 0.9 % IV BOLUS (SEPSIS)
500.0000 mL | INTRAVENOUS | Status: AC
  Administered 2011-01-31: 500 mL via INTRAVENOUS

## 2011-01-31 MED ORDER — IOHEXOL 300 MG/ML  SOLN
80.0000 mL | Freq: Once | INTRAMUSCULAR | Status: AC | PRN
  Administered 2011-01-31: 80 mL via INTRAVENOUS

## 2011-01-31 MED ORDER — POTASSIUM CHLORIDE CRYS ER 20 MEQ PO TBCR
40.0000 meq | EXTENDED_RELEASE_TABLET | Freq: Once | ORAL | Status: AC
  Administered 2011-01-31: 40 meq via ORAL
  Filled 2011-01-31: qty 2

## 2011-01-31 MED ORDER — ONDANSETRON HCL 4 MG/2ML IJ SOLN
4.0000 mg | Freq: Once | INTRAMUSCULAR | Status: AC
  Administered 2011-01-31: 4 mg via INTRAVENOUS

## 2011-01-31 MED ORDER — ONDANSETRON HCL 4 MG/2ML IJ SOLN
INTRAMUSCULAR | Status: AC
  Filled 2011-01-31: qty 2

## 2011-01-31 NOTE — ED Provider Notes (Signed)
Patient relates she was seen by Dr. Addison Lank on the 10th for joint pain specifically her knees and she was put on a course of prednisone for 6 days. She relates after that she had pulled muscle in her back or days ago which kept her up most of the night. She relates the next day she started sleeping and slept constantly for 3 days. She has some dysuria. She states she hasn't had a bowel movement in a week in the last time she went she had pellets for bowel movements she also relates she's having pain in her left lower quadrant that started a couple days ago and thinks it may be similar to when she had diverticulitis.  Patient is pleasant and alert she does have some tenderness in her left lower quadrant there is no guarding or rebound.  Medical screening examination/treatment/procedure(s) were conducted as a shared visit with non-physician practitioner(s) and myself.  I personally evaluated the patient during the encounter Rolland Porter, MD, Alanson Aly, MD 01/31/11 2037

## 2011-01-31 NOTE — ED Provider Notes (Signed)
History     CSN: [id number]  Arrival date & time 01/31/11  83   First MD Initiated Contact with Patient 01/31/11 1730      Chief Complaint  Patient presents with  . Abnormal Lab    (Consider location/radiation/quality/duration/timing/severity/associated sxs/prior treatment) HPI Comments: Patient with a history of diverticulitis and hypertension presents to the emergency department with chief complaint of left lower quadrant pain.  Patient states her pain began couple days ago and is similar to her past episode of diverticulitis.  Patient went to her primary care doctor's office earlier today for evaluation and was referred to the emergency department for an elevated white blood count.  Patient denies fevers, night sweats, chills,  nausea, vomiting, diarrhea.  Patient states that she has felt constipated recently.  In addition patient states that she recently date her prednisone and taper for her arthritis in her knees.  Patient has no other current complaints.    The history is provided by the patient.    Past Medical History  Diagnosis Date  . Hypertension   . Celiac disease   . Diverticulitis   . Heart murmur     Past Surgical History  Procedure Date  . Appendectomy   . Carpal tunnel release     No family history on file.  History  Substance Use Topics  . Smoking status: Never Smoker   . Smokeless tobacco: Not on file  . Alcohol Use: No    OB History    Grav Para Term Preterm Abortions TAB SAB Ect Mult Living                  Review of Systems  Constitutional: Negative for fever, chills and appetite change.  HENT: Negative for neck stiffness and dental problem.   Eyes: Negative for visual disturbance.  Respiratory: Negative for cough, chest tightness, shortness of breath and wheezing.   Cardiovascular: Negative for chest pain.  Gastrointestinal: Positive for nausea and abdominal pain. Negative for vomiting, diarrhea, constipation, blood in stool, abdominal  distention, anal bleeding and rectal pain.  Genitourinary: Negative for dysuria, urgency, hematuria and flank pain.  Musculoskeletal: Negative for myalgias and arthralgias.  Skin: Negative for rash.  Neurological: Negative for dizziness, syncope, speech difficulty, numbness and headaches.  Hematological: Does not bruise/bleed easily.  All other systems reviewed and are negative.    Allergies  Ace inhibitors; Ciprofloxacin; Ibuprofen; and Levofloxacin  Home Medications   Current Outpatient Rx  Name Route Sig Dispense Refill  . RISAQUAD PO CAPS Oral Take 1 capsule by mouth daily.    Marland Kitchen AMLODIPINE BESYLATE 5 MG PO TABS Oral Take 5 mg by mouth daily.    . ASPIRIN EC 81 MG PO TBEC Oral Take 81 mg by mouth daily.    . CHROMIUM PICOLINATE 200 MCG PO CAPS Oral Take 1 capsule by mouth daily.    Marland Kitchen LOSARTAN POTASSIUM-HCTZ 100-12.5 MG PO TABS Oral Take 1 tablet by mouth daily.    Marland Kitchen MAGNESIUM 200 MG PO TABS Oral Take 1 tablet by mouth daily.    Marland Kitchen METOPROLOL SUCCINATE ER 50 MG PO TB24 Oral Take 50 mg by mouth daily. Take with or immediately following a meal.    . OMEGA-3-ACID ETHYL ESTERS 1 G PO CAPS Oral Take 2 g by mouth daily.    Marland Kitchen PANTOPRAZOLE SODIUM 40 MG PO TBEC Oral Take 40 mg by mouth 2 (two) times daily.      BP 127/55  Pulse 75  Temp(Src) 98.6 F (  37 C) (Oral)  Resp 20  SpO2 99%  Physical Exam  Nursing note and vitals reviewed. Constitutional: Vital signs are normal. She appears well-developed and well-nourished. No distress.  HENT:  Head: Normocephalic and atraumatic.  Mouth/Throat: Uvula is midline, oropharynx is clear and moist and mucous membranes are normal.  Eyes: Conjunctivae and EOM are normal. Pupils are equal, round, and reactive to light.  Neck: Normal range of motion and full passive range of motion without pain. Neck supple. No spinous process tenderness and no muscular tenderness present. No rigidity. No Brudzinski's sign noted.  Cardiovascular: Normal rate and  regular rhythm.   Pulmonary/Chest: Effort normal and breath sounds normal. No accessory muscle usage. Not tachypneic. No respiratory distress.  Abdominal: Soft. Normal appearance and bowel sounds are normal. She exhibits no distension, no ascites, no pulsatile midline mass and no mass. There is tenderness in the left lower quadrant. There is no CVA tenderness. No hernia.  Lymphadenopathy:    She has no cervical adenopathy.  Neurological: She is alert.  Skin: Skin is warm and dry. No rash noted. She is not diaphoretic.  Psychiatric: She has a normal mood and affect. Her speech is normal and behavior is normal.    ED Course  Procedures (including critical care time)  Labs Reviewed  URINALYSIS, ROUTINE W REFLEX MICROSCOPIC - Abnormal; Notable for the following:    Ketones, ur TRACE (*)    Leukocytes, UA SMALL (*)    All other components within normal limits  CBC - Abnormal; Notable for the following:    WBC 16.3 (*)    All other components within normal limits  DIFFERENTIAL - Abnormal; Notable for the following:    Neutrophils Relative 82 (*)    Neutro Abs 13.4 (*)    Lymphocytes Relative 10 (*)    Monocytes Absolute 1.3 (*)    All other components within normal limits  BASIC METABOLIC PANEL - Abnormal; Notable for the following:    Sodium 129 (*)    Potassium 3.3 (*)    Chloride 91 (*)    Glucose, Bld 128 (*)    GFR calc non Af Amer 67 (*)    GFR calc Af Amer 77 (*)    All other components within normal limits  URINE MICROSCOPIC-ADD ON - Abnormal; Notable for the following:    Squamous Epithelial / LPF FEW (*)    Bacteria, UA FEW (*)    All other components within normal limits  URINE CULTURE   Ct Abdomen Pelvis W Contrast  01/31/2011  *RADIOLOGY REPORT*  Clinical Data: Lower quadrant pain.  Elevated white blood count.  CT ABDOMEN AND PELVIS WITH CONTRAST  Technique:  Multidetector CT imaging of the abdomen and pelvis was performed following the standard protocol during bolus  administration of intravenous contrast.  Contrast: 90m OMNIPAQUE IOHEXOL 300 MG/ML IV SOLN  Comparison: 12/03/2008  Findings: The left ovary is markedly abnormal and changed since the prior study of 12/03/2008.  It now measures 5.7 x 4.4 x 3.5 cm and has multiple cystic areas within it as well as slight soft tissue inflammation around the ovary.  This could represent tumor or infection.  It is immediately adjacent to an area of extensive diverticulosis of the sigmoid portion of the colon but there is no finding of acute diverticulitis.  The liver, spleen, pancreas, gallbladder, adrenal glands, and kidneys are normal.  No dilated bowel.  The right ovary and uterus are normal.  No free fluid in the pelvis.  No acute osseous abnormalities.  IMPRESSION: The patient has developed an abnormal appearance of the left ovary. The ovary has enlarged and there are now  multiple cystic areas within the ovary.  Slight inflammatory changes around the ovary. This could be due to infection or tumor.  Original Report Authenticated By: Larey Seat, M.D.     No diagnosis found.  Patient has been informed of her abnormal findings on CT exam.  She does not have a gynecologist to followup with.  Ultrasound for better evaluation of her left ovary has been ordered.  These results have also been discussed with Dr. Tomi Bamberger who is seen patient and agrees with plan to do ultrasound. Dr. Tomi Bamberger will resume care of patient.    MDM  Abnormal CT, Left ovary         Verl Dicker, PA-C 02/01/11 1110

## 2011-01-31 NOTE — ED Notes (Signed)
Pt presenting to ed from pcp's office with c/o abnormal WBC. Pt states she is having left side abdominal pain. Pt states constipation with positive nausea no vomiting

## 2011-01-31 NOTE — ED Notes (Signed)
Pt. Is unable to use the restroom at this time.

## 2011-01-31 NOTE — ED Provider Notes (Signed)
   23:50 Dr Dellis Filbert states to get CA125, put on Gentamycin, ampicillin and clindamycin, have medicine admit and consult GYN oncology.   00:35 Dr Quay Burow admit to med-surg bed, Team Amasa, MD 02/01/11 [phone]

## 2011-01-31 NOTE — ED Notes (Signed)
Oral CT contrast given to pt by CT staff.

## 2011-02-01 ENCOUNTER — Encounter (HOSPITAL_COMMUNITY): Payer: Self-pay | Admitting: Internal Medicine

## 2011-02-01 DIAGNOSIS — N83209 Unspecified ovarian cyst, unspecified side: Secondary | ICD-10-CM | POA: Diagnosis present

## 2011-02-01 DIAGNOSIS — N7093 Salpingitis and oophoritis, unspecified: Secondary | ICD-10-CM | POA: Clinically undetermined

## 2011-02-01 DIAGNOSIS — E876 Hypokalemia: Secondary | ICD-10-CM

## 2011-02-01 DIAGNOSIS — E871 Hypo-osmolality and hyponatremia: Secondary | ICD-10-CM | POA: Diagnosis present

## 2011-02-01 DIAGNOSIS — R109 Unspecified abdominal pain: Secondary | ICD-10-CM

## 2011-02-01 LAB — BASIC METABOLIC PANEL
Chloride: 98 mEq/L (ref 96–112)
Creatinine, Ser: 0.71 mg/dL (ref 0.50–1.10)
GFR calc Af Amer: >90 mL/min (ref >90)
GFR calc non Af Amer: 84 mL/min — ABNORMAL LOW (ref >90)

## 2011-02-01 LAB — CA 125: CA 125: 17.8 U/mL (ref 0.0–30.2)

## 2011-02-01 LAB — CBC
MCHC: 34.9 g/dL (ref 30.0–36.0)
MCV: 88 fL (ref 78.0–100.0)
Platelets: 294 10*3/uL (ref 150–400)
RDW: 13.3 % (ref 11.5–15.5)
WBC: 15.3 10*3/uL — ABNORMAL HIGH (ref 4.0–10.5)

## 2011-02-01 LAB — CANCER ANTIGEN 19-9: CA 19-9: 8.2 U/mL — ABNORMAL LOW (ref <35.0)

## 2011-02-01 MED ORDER — ASPIRIN EC 81 MG PO TBEC
81.0000 mg | DELAYED_RELEASE_TABLET | Freq: Every day | ORAL | Status: DC
  Administered 2011-02-01 – 2011-02-04 (×4): 81 mg via ORAL
  Filled 2011-02-01 (×4): qty 1

## 2011-02-01 MED ORDER — SODIUM CHLORIDE 0.9 % IV SOLN
2.0000 g | Freq: Once | INTRAVENOUS | Status: AC
  Administered 2011-02-01: 2 g via INTRAVENOUS
  Filled 2011-02-01: qty 2000

## 2011-02-01 MED ORDER — AMLODIPINE BESYLATE 5 MG PO TABS
5.0000 mg | ORAL_TABLET | Freq: Every day | ORAL | Status: DC
  Administered 2011-02-01 – 2011-02-02 (×2): 5 mg via ORAL
  Filled 2011-02-01 (×4): qty 1

## 2011-02-01 MED ORDER — LOSARTAN POTASSIUM-HCTZ 100-12.5 MG PO TABS: 1.0000 | ORAL_TABLET | Freq: Every day | ORAL | Status: DC

## 2011-02-01 MED ORDER — LOSARTAN POTASSIUM 50 MG PO TABS
100.0000 mg | ORAL_TABLET | Freq: Every day | ORAL | Status: DC
  Administered 2011-02-01 – 2011-02-04 (×4): 100 mg via ORAL
  Filled 2011-02-01 (×4): qty 2

## 2011-02-01 MED ORDER — SENNA 8.6 MG PO TABS
1.0000 | ORAL_TABLET | Freq: Two times a day (BID) | ORAL | Status: DC
  Filled 2011-02-01 (×2): qty 1

## 2011-02-01 MED ORDER — SODIUM CHLORIDE 0.9 % IV SOLN
2.0000 g | Freq: Four times a day (QID) | INTRAVENOUS | Status: DC
  Administered 2011-02-01 – 2011-02-02 (×6): 2 g via INTRAVENOUS
  Filled 2011-02-01 (×10): qty 2000

## 2011-02-01 MED ORDER — CLINDAMYCIN PHOSPHATE 900 MG/50ML IV SOLN
900.0000 mg | Freq: Three times a day (TID) | INTRAVENOUS | Status: DC
  Administered 2011-02-01 – 2011-02-02 (×3): 900 mg via INTRAVENOUS
  Filled 2011-02-01 (×8): qty 50

## 2011-02-01 MED ORDER — MORPHINE SULFATE 2 MG/ML IJ SOLN
1.0000 mg | INTRAMUSCULAR | Status: DC | PRN
Start: 2011-02-01 — End: 2011-02-04

## 2011-02-01 MED ORDER — POTASSIUM CHLORIDE CRYS ER 20 MEQ PO TBCR
40.0000 meq | EXTENDED_RELEASE_TABLET | Freq: Once | ORAL | Status: AC
  Administered 2011-02-02: 40 meq via ORAL
  Filled 2011-02-01: qty 2

## 2011-02-01 MED ORDER — METOPROLOL SUCCINATE ER 50 MG PO TB24
50.0000 mg | ORAL_TABLET | Freq: Every day | ORAL | Status: DC
  Administered 2011-02-01 – 2011-02-02 (×2): 50 mg via ORAL
  Filled 2011-02-01 (×4): qty 1

## 2011-02-01 MED ORDER — HYDROCHLOROTHIAZIDE 12.5 MG PO CAPS
12.5000 mg | ORAL_CAPSULE | Freq: Every day | ORAL | Status: DC
  Administered 2011-02-01 – 2011-02-02 (×2): 12.5 mg via ORAL
  Filled 2011-02-01 (×3): qty 1

## 2011-02-01 MED ORDER — ONDANSETRON HCL 4 MG PO TABS: 4.0000 mg | ORAL_TABLET | Freq: Four times a day (QID) | ORAL | Status: DC | PRN

## 2011-02-01 MED ORDER — CLINDAMYCIN PHOSPHATE 600 MG/50ML IV SOLN
600.0000 mg | Freq: Once | INTRAVENOUS | Status: AC
  Administered 2011-02-01: 600 mg via INTRAVENOUS
  Filled 2011-02-01: qty 50

## 2011-02-01 MED ORDER — DOCUSATE SODIUM 100 MG PO CAPS
100.0000 mg | ORAL_CAPSULE | Freq: Two times a day (BID) | ORAL | Status: DC
  Filled 2011-02-01 (×9): qty 1

## 2011-02-01 MED ORDER — SODIUM CHLORIDE 0.9 % IV SOLN
INTRAVENOUS | Status: DC
  Administered 2011-02-02: 02:00:00 via INTRAVENOUS

## 2011-02-01 MED ORDER — PANTOPRAZOLE SODIUM 40 MG PO TBEC
40.0000 mg | DELAYED_RELEASE_TABLET | Freq: Two times a day (BID) | ORAL | Status: DC
  Administered 2011-02-01: 40 mg via ORAL
  Filled 2011-02-01 (×3): qty 1

## 2011-02-01 MED ORDER — ACETAMINOPHEN 325 MG PO TABS
650.0000 mg | ORAL_TABLET | Freq: Four times a day (QID) | ORAL | Status: DC | PRN
  Administered 2011-02-01 – 2011-02-03 (×3): 650 mg via ORAL
  Filled 2011-02-01 (×3): qty 2

## 2011-02-01 MED ORDER — SODIUM CHLORIDE 0.9 % IV SOLN
INTRAVENOUS | Status: DC
  Administered 2011-02-01: 03:00:00 via INTRAVENOUS

## 2011-02-01 MED ORDER — GENTAMICIN SULFATE 40 MG/ML IJ SOLN
270.0000 mg | INTRAVENOUS | Status: DC
  Administered 2011-02-02 – 2011-02-04 (×3): 270 mg via INTRAVENOUS
  Filled 2011-02-01 (×4): qty 6.75

## 2011-02-01 MED ORDER — ONDANSETRON HCL 4 MG/2ML IJ SOLN
4.0000 mg | Freq: Four times a day (QID) | INTRAMUSCULAR | Status: DC | PRN
  Filled 2011-02-01: qty 2

## 2011-02-01 MED ORDER — ACETAMINOPHEN 650 MG RE SUPP: 650.0000 mg | Freq: Four times a day (QID) | RECTAL | Status: DC | PRN

## 2011-02-01 MED ORDER — GENTAMICIN SULFATE 40 MG/ML IJ SOLN
440.0000 mg | Freq: Once | INTRAVENOUS | Status: AC
  Administered 2011-02-01: 440 mg via INTRAVENOUS
  Filled 2011-02-01: qty 11

## 2011-02-01 MED ORDER — HEPARIN SODIUM (PORCINE) 5000 UNIT/ML IJ SOLN
5000.0000 [IU] | Freq: Three times a day (TID) | INTRAMUSCULAR | Status: DC
  Administered 2011-02-01 – 2011-02-03 (×9): 5000 [IU] via SUBCUTANEOUS
  Filled 2011-02-01 (×14): qty 1

## 2011-02-01 NOTE — Evaluation (Addendum)
Physical Therapy One-Time Evaluation Patient Details Name: [name] MRN: [id number] DOB: May 13, 1940 Today's Date: 02/01/2011  Problem List:  Patient Active Problem List  Diagnoses  . BARRETT'S ESOPHAGUS  . DIVERTICULITIS OF COLON  . NONSPECIFIC ABN FINDING RAD & OTH EXAM GI TRACT  . Ovarian cystic mass  . Tubo-ovarian abscess  . Hyponatremia  . Hypokalemia    Past Medical History:  Past Medical History  Diagnosis Date  . Hypertension   . Celiac disease     per Dr. Fuller Plan, is questionable.   . Diverticulitis     and diverticulosis in sigmoid colon   . Heart murmur   . IBS (irritable bowel syndrome)   . GERD (gastroesophageal reflux disease)   . Erosive esophagitis     with hiatal hernia, Barrett's esophagus 02/2004  . Degenerative joint disease    Past Surgical History:  Past Surgical History  Procedure Date  . Appendectomy   . Carpal tunnel release   . Tonsillectomy   . Knee arthroscopy     PT Assessment/Plan/Recommendation PT Assessment Clinical Impression Statement: Patient presents independent for mobility.  Encouraged hallway ambulation 2x/day during hospitalization.  No further skilled PT needs at this time. PT Recommendation/Assessment: Patent does not need any further PT services No Skilled PT: Patient is independent with all acitivity/mobility PT Recommendation Follow Up Recommendations: No PT follow up Equipment Recommended: None recommended by PT PT Goals     PT Evaluation Precautions/Restrictions    Prior Functioning  Home Living Lives With: Spouse Type of Home: House Home Layout: One level Home Access: Stairs to enter [employer] of Steps: 2 Home Adaptive Equipment: None Prior Function Level of Independence: Independent with basic ADLs;Independent with transfers;Independent with gait;Independent with homemaking with ambulation Driving: Yes Cognition Cognition Arousal/Alertness: Awake/alert Overall Cognitive Status: Appears  within functional limits for tasks assessed Sensation/Coordination Sensation Light Touch: Appears Intact Extremity Assessment RLE Assessment RLE Assessment: Within Functional Limits LLE Assessment LLE Assessment: Within Functional Limits Mobility (including Balance) Bed Mobility Bed Mobility: Yes Supine to Sit: 7: Independent Sitting - Scoot to Edge of Bed: 7: Independent Sit to Supine: 7: Independent Transfers Transfers: Yes Sit to Stand: 7: Independent Stand to Sit: 7: Independent Ambulation/Gait Ambulation/Gait: Yes Ambulation/Gait Assistance: 7: Independent Ambulation Distance (Feet): 400 Feet Assistive device: None Gait Pattern: Step-through pattern  Posture/Postural Control Posture/Postural Control: No significant limitations Balance Balance Assessed: Yes Static Standing Balance Single Leg Stance - Left Leg: 7  Exercise    End of Session PT - End of Session Activity Tolerance: Patient tolerated treatment well Patient left: in bed;with call bell in reach General Behavior During Session: [facility] for tasks performed Cognition: [facility] for tasks performed  [facility] 02/01/2011, 4:10 PM

## 2011-02-01 NOTE — Progress Notes (Signed)
Subjective: Pt states that she is feeling significantly better today.  Objective: Vital signs in last 24 hours: Temp:  [98 F (36.7 C)-99 F (37.2 C)] 98 F (36.7 C) (01/22 1400) Pulse Rate:  [62-91] 91  (01/22 1400) Resp:  [18-20] 18  (01/22 1400) BP: (110-142)/(55-74) 128/74 mmHg (01/22 1400) SpO2:  [95 %-100 %] 95 % (01/22 1400) Weight:  [63.617 kg (140 lb 4 oz)] 63.617 kg (140 lb 4 oz) (01/22 0031) Weight change:  Last BM Date: 02/01/11  Intake/Output from previous day: 01/21 0701 - 01/22 0700 In: 850 [I.V.:850] Out: 375 [Urine:375] Intake/Output this shift: Total I/O In: 109.1 [I.V.:109.1] Out: 400 [Urine:400]  General appearance: alert, cooperative, appears stated age and no distress Head: Normocephalic, without obvious abnormality, atraumatic Eyes: conjunctivae/corneas clear. PERRL, EOM's intact. Fundi benign. Neck: no adenopathy, no carotid bruit, no JVD, supple, symmetrical, trachea midline and thyroid not enlarged, symmetric, no tenderness/mass/nodules Resp: clear to auscultation bilaterally Cardio: regular rate and rhythm, S1, S2 normal, no murmur, click, rub or gallop GI: soft, non-tender; bowel sounds normal; no masses,  no organomegaly Extremities: extremities normal, atraumatic, no cyanosis or edema Pulses: 2+ and symmetric Skin: Skin color, texture, turgor normal. No rashes or lesions  Lab Results:  Basename 02/01/11 0423 01/31/11 1800  WBC 15.3* 16.3*  HGB 11.0* 12.6  HCT 31.5* 36.2  PLT 294 328   BMET  Basename 02/01/11 0423 01/31/11 1800  NA 131* 129*  K 3.6 3.3*  CL 98 91*  CO2 23 25  GLUCOSE 152* 128*  BUN 11 17  CREATININE 0.71 0.85  CALCIUM 8.5 9.1    Studies/Results: US Transvaginal Non-ob  01/31/2011  *RADIOLOGY REPORT*  Clinical Data: Abnormal appearance of the left ovary on CT scan. Elevated white blood cell count.  Pain.  TRANSABDOMINAL AND TRANSVAGINAL ULTRASOUND OF PELVIS Technique:  Both transabdominal and transvaginal  ultrasound examinations of the pelvis were performed. Transabdominal technique was performed for global imaging of the pelvis including uterus, ovaries, adnexal regions, and pelvic cul-de-sac.  Comparison: CT abdomen and pelvis 01/31/2011 at 1949 hours.   It was necessary to proceed with endovaginal exam following the transabdominal exam to visualize the left ovary.  Findings:  Uterus: Normal in size and appearance  Endometrium: Normal in thickness and appearance  Right ovary:  Normal appearance/no adnexal mass  Left ovary: Measures 5.9 x 4.8 x 3.6 cm.  Multiple hypoechoic foci are identified within the right ovary correlating with low attenuation seen on CT scan. There is increased signal on Doppler imaging in these locations.  Other findings: No free fluid  IMPRESSION: Abnormal appearance of the left ovary is compatible with tubo- ovarian abscess or neoplasm. Given elevated white count, infection is favored. However, repeat ultrasound in 2-3 weeks after appropriate therapy is recommended to exclude neoplasm.  Original Report Authenticated By: Arvid Right. Luther Parody, M.D.   US Pelvis Complete  01/31/2011  *RADIOLOGY REPORT*  Clinical Data: Abnormal appearance of the left ovary on CT scan. Elevated white blood cell count.  Pain.  TRANSABDOMINAL AND TRANSVAGINAL ULTRASOUND OF PELVIS Technique:  Both transabdominal and transvaginal ultrasound examinations of the pelvis were performed. Transabdominal technique was performed for global imaging of the pelvis including uterus, ovaries, adnexal regions, and pelvic cul-de-sac.  Comparison: CT abdomen and pelvis 01/31/2011 at 1949 hours.   It was necessary to proceed with endovaginal exam following the transabdominal exam to visualize the left ovary.  Findings:  Uterus: Normal in size and appearance  Endometrium: Normal in thickness and appearance  Right ovary:  Normal appearance/no adnexal mass  Left ovary: Measures 5.9 x 4.8 x 3.6 cm.  Multiple hypoechoic foci are  identified within the right ovary correlating with low attenuation seen on CT scan. There is increased signal on Doppler imaging in these locations.  Other findings: No free fluid  IMPRESSION: Abnormal appearance of the left ovary is compatible with tubo- ovarian abscess or neoplasm. Given elevated white count, infection is favored. However, repeat ultrasound in 2-3 weeks after appropriate therapy is recommended to exclude neoplasm.  Original Report Authenticated By: Arvid Right. Luther Parody, M.D.   Ct Abdomen Pelvis W Contrast  01/31/2011  *RADIOLOGY REPORT*  Clinical Data: Lower quadrant pain.  Elevated white blood count.  CT ABDOMEN AND PELVIS WITH CONTRAST  Technique:  Multidetector CT imaging of the abdomen and pelvis was performed following the standard protocol during bolus administration of intravenous contrast.  Contrast: 5m OMNIPAQUE IOHEXOL 300 MG/ML IV SOLN  Comparison: 12/03/2008  Findings: The left ovary is markedly abnormal and changed since the prior study of 12/03/2008.  It now measures 5.7 x 4.4 x 3.5 cm and has multiple cystic areas within it as well as slight soft tissue inflammation around the ovary.  This could represent tumor or infection.  It is immediately adjacent to an area of extensive diverticulosis of the sigmoid portion of the colon but there is no finding of acute diverticulitis.  The liver, spleen, pancreas, gallbladder, adrenal glands, and kidneys are normal.  No dilated bowel.  The right ovary and uterus are normal.  No free fluid in the pelvis.  No acute osseous abnormalities.  IMPRESSION: The patient has developed an abnormal appearance of the left ovary. The ovary has enlarged and there are now  multiple cystic areas within the ovary.  Slight inflammatory changes around the ovary. This could be due to infection or tumor.  Original Report Authenticated By: JLarey Seat M.D.    Medications: I have reviewed the patient's current medications.  Assessment/Plan: 1.  Tubo-ovarian abscess vs mass.  Seems much more likely abscess.  Agree with plan to continue IV abx until WBC normalized and then d/c home on PO antibiotics.  Follow up [place] in 2-3 weeks to follow appearance of ovary. On Amplicillin, Clindamycin and Gent. 2.HTN - controlled. 3. GERD - PPI 4. DJD -  Pain control.  LOS: 1 day   Tallie Dodds 02/01/2011, 3:24 PM

## 2011-02-01 NOTE — H&P (Signed)
PCP:   MCNEILL,[name], MD, MD  Confirmed with pt. 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   Pt is somewhat poor historian but relates feeling increasingly sleepy, malaised, and  without energy for the past week, for which she sought care at her PCP's office today, and  was found to have elevated WBC count, and sent to the ED. She also reports some LLQ pain,  described as deep and pelvic pain worse with urinating or having a BM. She reports  subjective fevers at home, and reports a temp of 99 a few days ago.   In the ED, vitals were stable. Labs significant for Na 129, K 3.3, Cl 91, renal  normal 17/0.85. WBC was 16.3 with 82% neutros 10% lymphs, rest of CBC stable. UA  with trace ketones, small LE/neg nitrite, few bacteria, UCx pending. CT  abd/pelvis showed abnormal appearance of left ovary, enlarged and with multiple  cystic areas within the ovary, with slight inflammatory changes around the ovary,  called as infxn vs tumor. Ultrasound called as tubo-ovarian abscess or neoplasm,  recommend repeat u/s in 2-3 wks after appropriate therapy to r/o neoplasm.   Gynecology on call was called, and recommended Ampicillin/Gentamycin/Clindamycin, get a  CA-125, and would need Gyn-Oncology consultation given a concern that this was malignant.  Pt is ordered for ABx and has also been given 500 cc NS, 40 mEq KCl, zofran.    Of note, she saw Dr. Fuller Plan in 12/2008 as outpt after an admission in 11/2008 for lower  abdominal pain, Dx with acute diverticulitis. CT scanning showed edema, soft tissue  inflammation in her rectosigmoid colon, felt likely due to diverticular disease although  mass couldn't be excluded. He recommended colonoscopy which showed moderate diverticulosis  but no masses.   ROS as above, also with  chronic hip and join pains that have worsened recently, making  walking more difficult. Otherwise unremarkable. Of note, she did take a rapid taper of  Prednisone but that stopped last Wednesday (almost a week ago).    Past Medical History  Diagnosis Date  . Hypertension   . Celiac disease     per Dr. Fuller Plan, is questionable.   . Diverticulitis     and diverticulosis in sigmoid colon   . Heart murmur   . IBS (irritable bowel syndrome)   . GERD (gastroesophageal reflux disease)   . Erosive esophagitis     with hiatal hernia, Barrett's esophagus 02/2004  . Degenerative joint disease     Past Surgical History  Procedure Date  . Appendectomy   . Carpal tunnel release   . Tonsillectomy   . Knee arthroscopy    Medications:  HOME MEDS:  Reconciled with pt and medication list  Prior to Admission medications   Medication Sig Start Date End Date Taking? Authorizing Provider  acidophilus (RISAQUAD) CAPS Take 1 capsule by mouth daily.   Yes Historical Provider, MD  amLODipine (NORVASC) 5 MG tablet Take 5 mg by mouth daily.   Yes Historical Provider, MD  aspirin EC 81 MG tablet Take 81 mg by mouth daily.   Yes Historical Provider, MD  cholecalciferol (VITAMIN D) 1000 UNITS tablet Take 1,000 Units by mouth daily.   Yes Historical Provider, MD  Chromium Picolinate 200 MCG CAPS Take 1 capsule by mouth daily.   Yes Historical  Provider, MD  losartan-hydrochlorothiazide (HYZAAR) 100-12.5 MG per tablet Take 1 tablet by mouth daily.   Yes Historical Provider, MD  Magnesium 200 MG TABS Take 1 tablet by mouth daily.   Yes Historical Provider, MD  metoprolol succinate (TOPROL-XL) 50 MG 24 hr tablet Take 50 mg by mouth daily. Take with or immediately following a meal.   Yes Historical Provider, MD  omega-3 acid ethyl esters (LOVAZA) 1 G capsule Take 2 g by mouth daily.   Yes Historical Provider, MD  pantoprazole (PROTONIX) 40 MG tablet Take 40 mg by mouth 2 (two) times daily.   Yes Historical Provider,  MD   Allergies:  Allergies  Allergen Reactions  . Ace Inhibitors   . Ciprofloxacin   . Ibuprofen   . Levofloxacin     Social History:   reports that she has never smoked. She has never used smokeless tobacco. She reports that she does not drink alcohol or use illicit drugs.  Lives at home, with husband. Has a son and daughter. Still ambulatory, still active. Kennyth Lose daughter [phone]  Family History: Family History  Problem Relation Age of Onset  . Breast cancer Sister   . Heart disease Father   . Stroke Mother     multiple   Physical Exam: Filed Vitals:   01/31/11 2337 02/01/11 0031 02/01/11 0156 02/01/11 0255  BP: 114/62  142/60 139/58  Pulse: 62  84 81  Temp: 99 F (37.2 C)  98.9 F (37.2 C) 98.5 F (36.9 C)  TempSrc: Oral  Oral Oral  Resp: 20  18 20   Height:  5' 0.25" (1.53 m)    Weight:  63.617 kg (140 lb 4 oz)    SpO2: 98%  98% 98%   Blood pressure 139/58, pulse 81, temperature 98.5 F (36.9 C), temperature source Oral, resp. rate 20, height 5' 0.25" (1.53 m), weight 63.617 kg (140 lb 4 oz), SpO2 98.00%.  Gen: Fairly healthy, averaged sized elderly F in ED stretcher, appears well, able to  relate history fairly, daughter at bedside. Doesn't appear toxic or ill. No distress.  HEENT: Pupils round, reactive to light, EOMI, sclera clear and normal. Mouth moist, normal  appearing. Lungs: CTAB no w/c/r, normal exam Heart: Early peaking systolic murmur, not tachycardic, regular, normal exam Abd: Minimally distended but not tight, soft, not rigid, minimal facial grimacing with  palpation of all quadrants but worst in the LLQ with guarding present. Bowel sounds  present. Not an acute abdomen.  Extrem: Warm, perfusing normally, bilateral radials palpable. No BLE edema, normal bulk  and tone Neuro: Alert, attentive, conversant, pleasant, CN 2-12 intact, moving extremities  spontaneously and sits up in ED bed on her own. Grossly non-focal   Labs & Imaging Results  for orders placed during the hospital encounter of 01/31/11 (from the past 48 hour(s))  CBC     Status: Abnormal   Collection Time   01/31/11  6:00 PM      Component Value Range Comment   WBC 16.3 (*) 4.0 - 10.5 (K/uL)    RBC 4.11  3.87 - 5.11 (MIL/uL)    Hemoglobin 12.6  12.0 - 15.0 (g/dL)    HCT 36.2  36.0 - 46.0 (%)    MCV 88.1  78.0 - 100.0 (fL)    MCH 30.7  26.0 - 34.0 (pg)    MCHC 34.8  30.0 - 36.0 (g/dL)    RDW 13.2  11.5 - 15.5 (%)    Platelets 328  150 -  400 (K/uL)   DIFFERENTIAL     Status: Abnormal   Collection Time   01/31/11  6:00 PM      Component Value Range Comment   Neutrophils Relative 82 (*) 43 - 77 (%)    Neutro Abs 13.4 (*) 1.7 - 7.7 (K/uL)    Lymphocytes Relative 10 (*) 12 - 46 (%)    Lymphs Abs 1.6  0.7 - 4.0 (K/uL)    Monocytes Relative 8  3 - 12 (%)    Monocytes Absolute 1.3 (*) 0.1 - 1.0 (K/uL)    Eosinophils Relative 0  0 - 5 (%)    Eosinophils Absolute 0.1  0.0 - 0.7 (K/uL)    Basophils Relative 0  0 - 1 (%)    Basophils Absolute 0.0  0.0 - 0.1 (K/uL)   BASIC METABOLIC PANEL     Status: Abnormal   Collection Time   01/31/11  6:00 PM      Component Value Range Comment   Sodium 129 (*) 135 - 145 (mEq/L)    Potassium 3.3 (*) 3.5 - 5.1 (mEq/L)    Chloride 91 (*) 96 - 112 (mEq/L)    CO2 25  19 - 32 (mEq/L)    Glucose, Bld 128 (*) 70 - 99 (mg/dL)    BUN 17  6 - 23 (mg/dL)    Creatinine, Ser 0.85  0.50 - 1.10 (mg/dL)    Calcium 9.1  8.4 - 10.5 (mg/dL)    GFR calc non Af Amer 67 (*) >90 (mL/min)    GFR calc Af Amer 77 (*) >90 (mL/min)   URINALYSIS, ROUTINE W REFLEX MICROSCOPIC     Status: Abnormal   Collection Time   01/31/11  6:54 PM      Component Value Range Comment   Color, Urine YELLOW  YELLOW     APPearance CLEAR  CLEAR     Specific Gravity, Urine 1.013  1.005 - 1.030     pH 5.5  5.0 - 8.0     Glucose, UA NEGATIVE  NEGATIVE (mg/dL)    Hgb urine dipstick NEGATIVE  NEGATIVE     Bilirubin Urine NEGATIVE  NEGATIVE     Ketones, ur TRACE (*)  NEGATIVE (mg/dL)    Protein, ur NEGATIVE  NEGATIVE (mg/dL)    Urobilinogen, UA 0.2  0.0 - 1.0 (mg/dL)    Nitrite NEGATIVE  NEGATIVE     Leukocytes, UA SMALL (*) NEGATIVE    URINE MICROSCOPIC-ADD ON     Status: Abnormal   Collection Time   01/31/11  6:54 PM      Component Value Range Comment   Squamous Epithelial / LPF FEW (*) RARE     WBC, UA 3-6  <3 (WBC/hpf)    Bacteria, UA FEW (*) RARE     US Transvaginal Non-ob  01/31/2011  *RADIOLOGY REPORT*  Clinical Data: Abnormal appearance of the left ovary on CT scan. Elevated white blood cell count.  Pain.  TRANSABDOMINAL AND TRANSVAGINAL ULTRASOUND OF PELVIS Technique:  Both transabdominal and transvaginal ultrasound examinations of the pelvis were performed. Transabdominal technique was performed for global imaging of the pelvis including uterus, ovaries, adnexal regions, and pelvic cul-de-sac.  Comparison: CT abdomen and pelvis 01/31/2011 at 1949 hours.   It was necessary to proceed with endovaginal exam following the transabdominal exam to visualize the left ovary.  Findings:  Uterus: Normal in size and appearance  Endometrium: Normal in thickness and appearance  Right ovary:  Normal appearance/no adnexal mass  Left ovary:  Measures 5.9 x 4.8 x 3.6 cm.  Multiple hypoechoic foci are identified within the right ovary correlating with low attenuation seen on CT scan. There is increased signal on Doppler imaging in these locations.  Other findings: No free fluid  IMPRESSION: Abnormal appearance of the left ovary is compatible with tubo- ovarian abscess or neoplasm. Given elevated white count, infection is favored. However, repeat ultrasound in 2-3 weeks after appropriate therapy is recommended to exclude neoplasm.  Original Report Authenticated By: Arvid Right. Luther Parody, M.D.   US Pelvis Complete  01/31/2011  *RADIOLOGY REPORT*  Clinical Data: Abnormal appearance of the left ovary on CT scan. Elevated white blood cell count.  Pain.  TRANSABDOMINAL AND  TRANSVAGINAL ULTRASOUND OF PELVIS Technique:  Both transabdominal and transvaginal ultrasound examinations of the pelvis were performed. Transabdominal technique was performed for global imaging of the pelvis including uterus, ovaries, adnexal regions, and pelvic cul-de-sac.  Comparison: CT abdomen and pelvis 01/31/2011 at 1949 hours.   It was necessary to proceed with endovaginal exam following the transabdominal exam to visualize the left ovary.  Findings:  Uterus: Normal in size and appearance  Endometrium: Normal in thickness and appearance  Right ovary:  Normal appearance/no adnexal mass  Left ovary: Measures 5.9 x 4.8 x 3.6 cm.  Multiple hypoechoic foci are identified within the right ovary correlating with low attenuation seen on CT scan. There is increased signal on Doppler imaging in these locations.  Other findings: No free fluid  IMPRESSION: Abnormal appearance of the left ovary is compatible with tubo- ovarian abscess or neoplasm. Given elevated white count, infection is favored. However, repeat ultrasound in 2-3 weeks after appropriate therapy is recommended to exclude neoplasm.  Original Report Authenticated By: Arvid Right. Luther Parody, M.D.   Ct Abdomen Pelvis W Contrast  01/31/2011  *RADIOLOGY REPORT*  Clinical Data: Lower quadrant pain.  Elevated white blood count.  CT ABDOMEN AND PELVIS WITH CONTRAST  Technique:  Multidetector CT imaging of the abdomen and pelvis was performed following the standard protocol during bolus administration of intravenous contrast.  Contrast: 65m OMNIPAQUE IOHEXOL 300 MG/ML IV SOLN  Comparison: 12/03/2008  Findings: The left ovary is markedly abnormal and changed since the prior study of 12/03/2008.  It now measures 5.7 x 4.4 x 3.5 cm and has multiple cystic areas within it as well as slight soft tissue inflammation around the ovary.  This could represent tumor or infection.  It is immediately adjacent to an area of extensive diverticulosis of the sigmoid portion of the  colon but there is no finding of acute diverticulitis.  The liver, spleen, pancreas, gallbladder, adrenal glands, and kidneys are normal.  No dilated bowel.  The right ovary and uterus are normal.  No free fluid in the pelvis.  No acute osseous abnormalities.  IMPRESSION: The patient has developed an abnormal appearance of the left ovary. The ovary has enlarged and there are now  multiple cystic areas within the ovary.  Slight inflammatory changes around the ovary. This could be due to infection or tumor.  Original Report Authenticated By: JLarey Seat M.D.    Impression Present on Admission:  .Ovarian cystic mass .Hyponatremia  70yoF with h/o HTN, questionable celiac disease, diverticulitis presents with LLQ pain and  found to have left ovarian cystic lesion concerning for TOA vs neoplasm.   1. 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 Despite WBC 16, pt does not appear toxic or even ill, and has  no documented  fevers although reports temps of 99 at home, vitals are stable. Therefore, will admit  regular bed, give IV ABx per Gyn recommendations, and will need Gyn-Onc consultation in  the am per Gyn recommendations.   - Amp/Gent/Clinda. Call Gyn-Onc in the am. CA-125 with am labs. IVF's.   2. Hyponatremia: Likely pre-renal as pt endorses poor PO intake for the past week. Will  give IVF's and trend BMET.   3. HypoK: pt is s/p 40 mEq in the ED, will give another oral dose and trend.   4. HTN: Continue home amlodipine, ASA 81, Losartan/HCTZ, Toprol  5. Holding other non-essential meds   Regular bed, [facility] team 2 Full code, discussed with pt      Other plans as per orders.  Cailah Reach 02/01/2011, 3:05 AM

## 2011-02-01 NOTE — ED Provider Notes (Signed)
See prior note   Janice Norrie, MD 02/01/11 1125

## 2011-02-01 NOTE — Consult Note (Signed)
[name] March 25, 1940  [id number].   Primary Care MD: Cari Caraway Requesting MD: Dr. Candiss Norse Chief Complaint/Reason for Consult: tubo-ovarian abscess vs neoplasm HPI: This is a pleasant 71 yo WF who began having some generalized myalgias and lethargy approximately a month and a half ago.  She was recently put on a prednisone dose pack by her PCP for arthritis in her knees.  She felt much better on this, but as soon as she stopped this past week, she began feeling poor again.  She then noticed some LLQ abdominal discomfort this past Saturday.  She had a low grade fever of 99.3.  Due to persistent pain, she saw her PCP on Monday.  They were suspicious for diverticulitis since she has a history of it 2 years ago.  She had an elevated WBC of 16k, and she was sent to [facility].  She then had a CT scan done which revealed changes in the left ovary c/w TOA vs neoplasm.  She does have diverticulosis of adjacent sigmoid colon, but no evidence of diverticulitis.  She was admitted and GYN has been asked to evaluate the patient.  She has also had an transvaginal u/s that confirms an irregularity within the left ovary.  We have been asked to see the patient to rule out any complications resulting from the adjacent colon.  Review of Systems: Please see HPI, otherwise all other systems have been reviewed and are negative.  Family History  Problem Relation Age of Onset  . Breast cancer Sister   . Heart disease Father   . Stroke Mother     multiple    Past Medical History  Diagnosis Date  . Hypertension   . Celiac disease     per Dr. Fuller Plan, is questionable.   . Diverticulitis     and diverticulosis in sigmoid colon   . Heart murmur   . IBS (irritable bowel syndrome)   . GERD (gastroesophageal reflux disease)   . Erosive esophagitis     with hiatal hernia, Barrett's esophagus 02/2004  . Degenerative joint disease     Past Surgical History  Procedure Date  . Appendectomy   . Carpal tunnel release   .  Tonsillectomy   . Knee arthroscopy     Social History:  reports that she has never smoked. She has never used smokeless tobacco. She reports that she does not drink alcohol or use illicit drugs.  Allergies:  Allergies  Allergen Reactions  . Gluten Meal   . Ace Inhibitors   . Ciprofloxacin   . Ibuprofen   . Levofloxacin     Medications Prior to Admission  Medication Dose Route Frequency Provider Last Rate Last Dose  . 0.9 %  sodium chloride infusion   Intravenous Continuous Thurnell Lose, MD      . acetaminophen (TYLENOL) tablet 650 mg  650 mg Oral Q6H PRN [name], MD   650 mg at 02/01/11 0341   Or  . acetaminophen (TYLENOL) suppository 650 mg  650 mg Rectal Q6H PRN [name], MD      . amLODipine (NORVASC) tablet 5 mg  5 mg Oral Daily [name], MD   5 mg at 02/01/11 0943  . ampicillin (OMNIPEN) 2 g in sodium chloride 0.9 % 50 mL IVPB  2 g Intravenous Once Janice Norrie, MD   2 g at 02/01/11 0149  . ampicillin (OMNIPEN) 2 g in sodium chloride 0.9 % 50 mL IVPB  2 g Intravenous Q6H [name], MD  2 g at 02/01/11 0833  . aspirin EC tablet 81 mg  81 mg Oral Daily [name], MD   81 mg at 02/01/11 0943  . clindamycin (CLEOCIN) IVPB 600 mg  600 mg Intravenous Once Janice Norrie, MD   600 mg at 02/01/11 0426  . clindamycin (CLEOCIN) IVPB 900 mg  900 mg Intravenous Q8H [name], MD      . docusate sodium (COLACE) capsule 100 mg  100 mg Oral BID [name], MD      . gentamicin (GARAMYCIN) 270 mg in dextrose 5 % 100 mL IVPB  270 mg Intravenous Q24H [name], MD      . gentamicin (GARAMYCIN) 440 mg in dextrose 5 % 100 mL IVPB  440 mg Intravenous Once [name], MD   440 mg at 02/01/11 0312  . heparin injection 5,000 Units  5,000 Units Subcutaneous Q8H [name], MD   5,000 Units at 02/01/11 0636  . hydrochlorothiazide (MICROZIDE) capsule 12.5 mg  12.5 mg Oral Daily [name], MD   12.5 mg at 02/01/11 0943  . iohexol (OMNIPAQUE) 300 MG/ML solution 80 mL  80 mL Intravenous Once PRN  Medication Radiologist, MD   80 mL at 01/31/11 1951  . losartan (COZAAR) tablet 100 mg  100 mg Oral Daily [name], MD   100 mg at 02/01/11 0942  . metoprolol succinate (TOPROL-XL) 24 hr tablet 50 mg  50 mg Oral Daily [name], MD   50 mg at 02/01/11 0942  . morphine 2 MG/ML injection 1 mg  1 mg Intravenous Q4H PRN [name], MD      . ondansetron [facility]) injection 4 mg  4 mg Intravenous Once Janice Norrie, MD   4 mg at 01/31/11 2344  . ondansetron (ZOFRAN) tablet 4 mg  4 mg Oral Q6H PRN [name], MD       Or  . ondansetron [facility]) injection 4 mg  4 mg Intravenous Q6H PRN [name], MD      . potassium chloride SA (K-DUR,KLOR-CON) CR tablet 40 mEq  40 mEq Oral Once [employer]	[place], PA-C   40 mEq at 01/31/11 2034  . potassium chloride SA (K-DUR,KLOR-CON) CR tablet 40 mEq  40 mEq Oral Once [name], MD      . senna [facility]) tablet 8.6 mg  1 tablet Oral BID [name], MD      . sodium chloride 0.9 % bolus 500 mL  500 mL Intravenous STAT Lisette Paz, PA-C   500 mL at 01/31/11 1807  . DISCONTD: 0.9 %  sodium chloride infusion   Intravenous Continuous [name], MD 100 mL/hr at 02/01/11 0300    . DISCONTD: losartan-hydrochlorothiazide (HYZAAR) 100-12.5 MG per tablet 1 tablet  1 tablet Oral Daily [name], MD       Medications Prior to Admission  Medication Sig Dispense Refill  . cholecalciferol (VITAMIN D) 1000 UNITS tablet Take 1,000 Units by mouth daily.        Blood pressure 110/70, pulse 68, temperature 98.5 F (36.9 C), temperature source Oral, resp. rate 20, height 5' 0.25" (1.53 m), weight 140 lb 4 oz (63.617 kg), SpO2 97.00%. Physical Exam: General: pleasant, WD, WN, 71 yo WF in NAD HEENT: head is normocephalic, atraumatic. Sclera are noninjected.  PERRL.  No rhinorrhea.  Mouth is pink Heart: regular rate and rhythm, normal s1,s2.  No murmurs, gallops, or rubs noted.  Palpable radial and pedal pulses bilaterally Lungs: CTAB, no wheezes, rhonchi, or rales noted.  Respiratory  effort is nonlabored Abd: soft, minimally tender in LLQ, no rebounding or guarding.  +BS, ND.  No masses, hernia, or organomegaly noted. GU: internal exam deferred to GYN MS: all 4 extremities symmetrical with no cyanosis, clubbing, or edema Psych: A&O x 3.  Appropriate affect.   Results for orders placed during the hospital encounter of 01/31/11 (from the past 48 hour(s))  CBC     Status: Abnormal   Collection Time   01/31/11  6:00 PM      Component Value Range Comment   WBC 16.3 (*) 4.0 - 10.5 (K/uL)    RBC 4.11  3.87 - 5.11 (MIL/uL)    Hemoglobin 12.6  12.0 - 15.0 (g/dL)    HCT 36.2  36.0 - 46.0 (%)    MCV 88.1  78.0 - 100.0 (fL)    MCH 30.7  26.0 - 34.0 (pg)    MCHC 34.8  30.0 - 36.0 (g/dL)    RDW 13.2  11.5 - 15.5 (%)    Platelets 328  150 - 400 (K/uL)   DIFFERENTIAL     Status: Abnormal   Collection Time   01/31/11  6:00 PM      Component Value Range Comment   Neutrophils Relative 82 (*) 43 - 77 (%)    Neutro Abs 13.4 (*) 1.7 - 7.7 (K/uL)    Lymphocytes Relative 10 (*) 12 - 46 (%)    Lymphs Abs 1.6  0.7 - 4.0 (K/uL)    Monocytes Relative 8  3 - 12 (%)    Monocytes Absolute 1.3 (*) 0.1 - 1.0 (K/uL)    Eosinophils Relative 0  0 - 5 (%)    Eosinophils Absolute 0.1  0.0 - 0.7 (K/uL)    Basophils Relative 0  0 - 1 (%)    Basophils Absolute 0.0  0.0 - 0.1 (K/uL)   BASIC METABOLIC PANEL     Status: Abnormal   Collection Time   01/31/11  6:00 PM      Component Value Range Comment   Sodium 129 (*) 135 - 145 (mEq/L)    Potassium 3.3 (*) 3.5 - 5.1 (mEq/L)    Chloride 91 (*) 96 - 112 (mEq/L)    CO2 25  19 - 32 (mEq/L)    Glucose, Bld 128 (*) 70 - 99 (mg/dL)    BUN 17  6 - 23 (mg/dL)    Creatinine, Ser 0.85  0.50 - 1.10 (mg/dL)    Calcium 9.1  8.4 - 10.5 (mg/dL)    GFR calc non Af Amer 67 (*) >90 (mL/min)    GFR calc Af Amer 77 (*) >90 (mL/min)   URINALYSIS, ROUTINE W REFLEX MICROSCOPIC     Status: Abnormal   Collection Time   01/31/11  6:54 PM      Component Value Range  Comment   Color, Urine YELLOW  YELLOW     APPearance CLEAR  CLEAR     Specific Gravity, Urine 1.013  1.005 - 1.030     pH 5.5  5.0 - 8.0     Glucose, UA NEGATIVE  NEGATIVE (mg/dL)    Hgb urine dipstick NEGATIVE  NEGATIVE     Bilirubin Urine NEGATIVE  NEGATIVE     Ketones, ur TRACE (*) NEGATIVE (mg/dL)    Protein, ur NEGATIVE  NEGATIVE (mg/dL)    Urobilinogen, UA 0.2  0.0 - 1.0 (mg/dL)    Nitrite NEGATIVE  NEGATIVE     Leukocytes, UA SMALL (*) NEGATIVE  URINE MICROSCOPIC-ADD ON     Status: Abnormal   Collection Time   01/31/11  6:54 PM      Component Value Range Comment   Squamous Epithelial / LPF FEW (*) RARE     WBC, UA 3-6  <3 (WBC/hpf)    Bacteria, UA FEW (*) RARE    CBC     Status: Abnormal   Collection Time   02/01/11  4:23 AM      Component Value Range Comment   WBC 15.3 (*) 4.0 - 10.5 (K/uL)    RBC 3.58 (*) 3.87 - 5.11 (MIL/uL)    Hemoglobin 11.0 (*) 12.0 - 15.0 (g/dL)    HCT 31.5 (*) 36.0 - 46.0 (%)    MCV 88.0  78.0 - 100.0 (fL)    MCH 30.7  26.0 - 34.0 (pg)    MCHC 34.9  30.0 - 36.0 (g/dL)    RDW 13.3  11.5 - 15.5 (%)    Platelets 294  150 - 400 (K/uL)   BASIC METABOLIC PANEL     Status: Abnormal   Collection Time   02/01/11  4:23 AM      Component Value Range Comment   Sodium 131 (*) 135 - 145 (mEq/L)    Potassium 3.6  3.5 - 5.1 (mEq/L)    Chloride 98  96 - 112 (mEq/L)    CO2 23  19 - 32 (mEq/L)    Glucose, Bld 152 (*) 70 - 99 (mg/dL)    BUN 11  6 - 23 (mg/dL)    Creatinine, Ser 0.71  0.50 - 1.10 (mg/dL)    Calcium 8.5  8.4 - 10.5 (mg/dL)    GFR calc non Af Amer 84 (*) >90 (mL/min)    GFR calc Af Amer >90  >90 (mL/min)    US Transvaginal Non-ob  01/31/2011  *RADIOLOGY REPORT*  Clinical Data: Abnormal appearance of the left ovary on CT scan. Elevated white blood cell count.  Pain.  TRANSABDOMINAL AND TRANSVAGINAL ULTRASOUND OF PELVIS Technique:  Both transabdominal and transvaginal ultrasound examinations of the pelvis were performed. Transabdominal technique  was performed for global imaging of the pelvis including uterus, ovaries, adnexal regions, and pelvic cul-de-sac.  Comparison: CT abdomen and pelvis 01/31/2011 at 1949 hours.   It was necessary to proceed with endovaginal exam following the transabdominal exam to visualize the left ovary.  Findings:  Uterus: Normal in size and appearance  Endometrium: Normal in thickness and appearance  Right ovary:  Normal appearance/no adnexal mass  Left ovary: Measures 5.9 x 4.8 x 3.6 cm.  Multiple hypoechoic foci are identified within the right ovary correlating with low attenuation seen on CT scan. There is increased signal on Doppler imaging in these locations.  Other findings: No free fluid  IMPRESSION: Abnormal appearance of the left ovary is compatible with tubo- ovarian abscess or neoplasm. Given elevated white count, infection is favored. However, repeat ultrasound in 2-3 weeks after appropriate therapy is recommended to exclude neoplasm.  Original Report Authenticated By: Arvid Right. Luther Parody, M.D.   US Pelvis Complete  01/31/2011  *RADIOLOGY REPORT*  Clinical Data: Abnormal appearance of the left ovary on CT scan. Elevated white blood cell count.  Pain.  TRANSABDOMINAL AND TRANSVAGINAL ULTRASOUND OF PELVIS Technique:  Both transabdominal and transvaginal ultrasound examinations of the pelvis were performed. Transabdominal technique was performed for global imaging of the pelvis including uterus, ovaries, adnexal regions, and pelvic cul-de-sac.  Comparison: CT abdomen and pelvis 01/31/2011 at 1949 hours.   It was  necessary to proceed with endovaginal exam following the transabdominal exam to visualize the left ovary.  Findings:  Uterus: Normal in size and appearance  Endometrium: Normal in thickness and appearance  Right ovary:  Normal appearance/no adnexal mass  Left ovary: Measures 5.9 x 4.8 x 3.6 cm.  Multiple hypoechoic foci are identified within the right ovary correlating with low attenuation seen on CT scan.  There is increased signal on Doppler imaging in these locations.  Other findings: No free fluid  IMPRESSION: Abnormal appearance of the left ovary is compatible with tubo- ovarian abscess or neoplasm. Given elevated white count, infection is favored. However, repeat ultrasound in 2-3 weeks after appropriate therapy is recommended to exclude neoplasm.  Original Report Authenticated By: Arvid Right. Luther Parody, M.D.   Ct Abdomen Pelvis W Contrast  01/31/2011  *RADIOLOGY REPORT*  Clinical Data: Lower quadrant pain.  Elevated white blood count.  CT ABDOMEN AND PELVIS WITH CONTRAST  Technique:  Multidetector CT imaging of the abdomen and pelvis was performed following the standard protocol during bolus administration of intravenous contrast.  Contrast: 48m OMNIPAQUE IOHEXOL 300 MG/ML IV SOLN  Comparison: 12/03/2008  Findings: The left ovary is markedly abnormal and changed since the prior study of 12/03/2008.  It now measures 5.7 x 4.4 x 3.5 cm and has multiple cystic areas within it as well as slight soft tissue inflammation around the ovary.  This could represent tumor or infection.  It is immediately adjacent to an area of extensive diverticulosis of the sigmoid portion of the colon but there is no finding of acute diverticulitis.  The liver, spleen, pancreas, gallbladder, adrenal glands, and kidneys are normal.  No dilated bowel.  The right ovary and uterus are normal.  No free fluid in the pelvis.  No acute osseous abnormalities.  IMPRESSION: The patient has developed an abnormal appearance of the left ovary. The ovary has enlarged and there are now  multiple cystic areas within the ovary.  Slight inflammatory changes around the ovary. This could be due to infection or tumor.  Original Report Authenticated By: JLarey Seat M.D.       Assessment/Plan 1. 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 Diverticulosis, no active diverticulitis  Plan: The patient does not have any evidence of  active diverticulitis that would be a contributing factor to the problem present in the left ovary.  Based off of CT scan and TV u/s, it appears this is likely to be a TOA vs a neoplasm.  Would defer further treatment to GYN and primary team.  No acute general surgical issues are present.  Thank you for this consultation.  Brandyn Lowrey E 02/01/2011, 11:34 AM

## 2011-02-01 NOTE — Consult Note (Signed)
This appears secondary to ovarian process on left .  Sigmoid colon shows no inflammation.  Agree with GYN input.

## 2011-02-01 NOTE — Progress Notes (Addendum)
Kahlee Metivier [id number] is a 71 y.o. female,  Outpatient Primary MD for the patient is MCNEILL,[name], MD, MD  Chief Complaint  Patient presents with  . Abnormal Lab        Subjective:   Linton Rump today has, No headache, No chest pain, imrpoved abdominal pain - No Nausea, No new weakness tingling or numbness, No Cough - SOB.    Objective:   Filed Vitals:   02/01/11 0255 02/01/11 0636 02/01/11 0942 02/01/11 0943  BP: 139/58 118/68 110/70 110/70  Pulse: 81 70 68   Temp: 98.5 F (36.9 C) 98.5 F (36.9 C)    TempSrc: Oral Oral    Resp: 20 20    Height:      Weight:      SpO2: 98% 97%      Wt Readings from Last 3 Encounters:  02/01/11 63.617 kg (140 lb 4 oz)  12/17/08 66.951 kg (147 lb 9.6 oz)     Intake/Output Summary (Last 24 hours) at 02/01/11 1127 Last data filed at 02/01/11 0640  Gross per 24 hour  Intake    850 ml  Output    375 ml  Net    475 ml    Exam Awake Alert, Oriented *3, No new F.N deficits, Normal affect [place].AT,PERRAL Supple Neck,No JVD, No cervical lymphadenopathy appriciated.  Symmetrical Chest wall movement, Good air movement bilaterally, CTAB RRR,No Gallops,Rubs or new Murmurs, No Parasternal Heave +ve B.Sounds, Abd Soft, mild LLQ tenderness, No organomegaly appriciated, No rebound -guarding or rigidity. No Cyanosis, Clubbing or edema, No new Rash or bruise     Data Review  CBC  Lab 02/01/11 0423 01/31/11 1800  WBC 15.3* 16.3*  HGB 11.0* 12.6  HCT 31.5* 36.2  PLT 294 328  MCV 88.0 88.1  MCH 30.7 30.7  MCHC 34.9 34.8  RDW 13.3 13.2  LYMPHSABS -- 1.6  MONOABS -- 1.3*  EOSABS -- 0.1  BASOSABS -- 0.0  BANDABS -- --    Chemistries   Lab 02/01/11 0423 01/31/11 1800  NA 131* 129*  K 3.6 3.3*  CL 98 91*  CO2 23 25  GLUCOSE 152* 128*  BUN 11 17  CREATININE 0.71 0.85  CALCIUM 8.5 9.1  MG -- --  AST -- --  ALT -- --  ALKPHOS -- --  BILITOT -- --    ------------------------------------------------------------------------------------------------------------------ estimated creatinine clearance is 53.3 ml/min (by C-G formula based on Cr of 0.71). ------------------------------------------------------------------------------------------------------------------ No results found for this basename: HGBA1C:2 in the last 72 hours ------------------------------------------------------------------------------------------------------------------ No results found for this basename: CHOL:2,HDL:2,LDLCALC:2,TRIG:2,CHOLHDL:2,LDLDIRECT:2 in the last 72 hours ------------------------------------------------------------------------------------------------------------------ No results found for this basename: TSH,T4TOTAL,FREET3,T3FREE,THYROIDAB in the last 72 hours ------------------------------------------------------------------------------------------------------------------ No results found for this basename: VITAMINB12:2,FOLATE:2,FERRITIN:2,TIBC:2,IRON:2,RETICCTPCT:2 in the last 72 hours  Coagulation profile No results found for this basename: INR:5,PROTIME:5 in the last 168 hours  No results found for this basename: DDIMER:2 in the last 72 hours  Cardiac Enzymes No results found for this basename: CK:3,CKMB:3,TROPONINI:3,MYOGLOBIN:3 in the last 168 hours ------------------------------------------------------------------------------------------------------------------ No components found with this basename: POCBNP:3  Micro Results No results found for this or any previous visit (from the past 240 hour(s)).  Radiology Reports US Transvaginal Non-ob  01/31/2011  *RADIOLOGY REPORT*  Clinical Data: Abnormal appearance of the left ovary on CT scan. Elevated white blood cell count.  Pain.  TRANSABDOMINAL AND TRANSVAGINAL ULTRASOUND OF PELVIS Technique:  Both transabdominal and transvaginal ultrasound examinations of the pelvis were performed.  Transabdominal technique was performed for global imaging of the pelvis including  uterus, ovaries, adnexal regions, and pelvic cul-de-sac.  Comparison: CT abdomen and pelvis 01/31/2011 at 1949 hours.   It was necessary to proceed with endovaginal exam following the transabdominal exam to visualize the left ovary.  Findings:  Uterus: Normal in size and appearance  Endometrium: Normal in thickness and appearance  Right ovary:  Normal appearance/no adnexal mass  Left ovary: Measures 5.9 x 4.8 x 3.6 cm.  Multiple hypoechoic foci are identified within the right ovary correlating with low attenuation seen on CT scan. There is increased signal on Doppler imaging in these locations.  Other findings: No free fluid  IMPRESSION: Abnormal appearance of the left ovary is compatible with tubo- ovarian abscess or neoplasm. Given elevated white count, infection is favored. However, repeat ultrasound in 2-3 weeks after appropriate therapy is recommended to exclude neoplasm.  Original Report Authenticated By: Arvid Right. Luther Parody, M.D.   US Pelvis Complete  01/31/2011  *RADIOLOGY REPORT*  Clinical Data: Abnormal appearance of the left ovary on CT scan. Elevated white blood cell count.  Pain.  TRANSABDOMINAL AND TRANSVAGINAL ULTRASOUND OF PELVIS Technique:  Both transabdominal and transvaginal ultrasound examinations of the pelvis were performed. Transabdominal technique was performed for global imaging of the pelvis including uterus, ovaries, adnexal regions, and pelvic cul-de-sac.  Comparison: CT abdomen and pelvis 01/31/2011 at 1949 hours.   It was necessary to proceed with endovaginal exam following the transabdominal exam to visualize the left ovary.  Findings:  Uterus: Normal in size and appearance  Endometrium: Normal in thickness and appearance  Right ovary:  Normal appearance/no adnexal mass  Left ovary: Measures 5.9 x 4.8 x 3.6 cm.  Multiple hypoechoic foci are identified within the right ovary correlating with low  attenuation seen on CT scan. There is increased signal on Doppler imaging in these locations.  Other findings: No free fluid  IMPRESSION: Abnormal appearance of the left ovary is compatible with tubo- ovarian abscess or neoplasm. Given elevated white count, infection is favored. However, repeat ultrasound in 2-3 weeks after appropriate therapy is recommended to exclude neoplasm.  Original Report Authenticated By: Arvid Right. Luther Parody, M.D.   Ct Abdomen Pelvis W Contrast  01/31/2011  *RADIOLOGY REPORT*  Clinical Data: Lower quadrant pain.  Elevated white blood count.  CT ABDOMEN AND PELVIS WITH CONTRAST  Technique:  Multidetector CT imaging of the abdomen and pelvis was performed following the standard protocol during bolus administration of intravenous contrast.  Contrast: 70m OMNIPAQUE IOHEXOL 300 MG/ML IV SOLN  Comparison: 12/03/2008  Findings: The left ovary is markedly abnormal and changed since the prior study of 12/03/2008.  It now measures 5.7 x 4.4 x 3.5 cm and has multiple cystic areas within it as well as slight soft tissue inflammation around the ovary.  This could represent tumor or infection.  It is immediately adjacent to an area of extensive diverticulosis of the sigmoid portion of the colon but there is no finding of acute diverticulitis.  The liver, spleen, pancreas, gallbladder, adrenal glands, and kidneys are normal.  No dilated bowel.  The right ovary and uterus are normal.  No free fluid in the pelvis.  No acute osseous abnormalities.  IMPRESSION: The patient has developed an abnormal appearance of the left ovary. The ovary has enlarged and there are now  multiple cystic areas within the ovary.  Slight inflammatory changes around the ovary. This could be due to infection or tumor.  Original Report Authenticated By: JLarey Seat M.D.    Scheduled Meds:   . amLODipine  5 mg Oral Daily  . ampicillin (OMNIPEN) IV  2 g Intravenous Once  . ampicillin (OMNIPEN) IV  2 g Intravenous Q6H    . aspirin EC  81 mg Oral Daily  . clindamycin (CLEOCIN) IV  600 mg Intravenous Once  . clindamycin (CLEOCIN) IV  900 mg Intravenous Q8H  . docusate sodium  100 mg Oral BID  . gentamicin  270 mg Intravenous Q24H  . gentamicin  440 mg Intravenous Once  . heparin  5,000 Units Subcutaneous Q8H  . hydrochlorothiazide  12.5 mg Oral Daily  . losartan  100 mg Oral Daily  . metoprolol succinate  50 mg Oral Daily  . ondansetron (ZOFRAN) IV  4 mg Intravenous Once  . potassium chloride  40 mEq Oral Once  . potassium chloride  40 mEq Oral Once  . senna  1 tablet Oral BID  . sodium chloride  500 mL Intravenous STAT  . DISCONTD: losartan-hydrochlorothiazide  1 tablet Oral Daily   Continuous Infusions:   . sodium chloride    . sodium chloride 100 mL/hr at 02/01/11 0300   PRN Meds:.acetaminophen, acetaminophen, iohexol, morphine, ondansetron (ZOFRAN) IV, ondansetron  Assessment & Plan   This patient was admitted 3 AM this morning by one of my partners during the night call.  Patient was admitted for left-sided tubo-ovarian abscess, I have seen the patient, I have called GYN physician Dr. Lonia Skinner who was in the room seeing the patient at this time, up on her recommendation I have also called general surgery and GYN oncology to see the patient, GYN oncology physician on call is here from [facility] today she will review her CT scan and let us know if she has something to offer, for now patient's see 125 has been ordered and it's pending.  Clinical picture is consistent with left tubo-ovarian abscess with questionable bowel involvement, for now agree with IV antibiotics, IVF her WBC is trending down she is afebrile and feels a whole lot better, we'll wait for GYN official input, surgery input and feedback from GYN oncologist.  DVT Prophylaxis   Heparin    See all Orders from today for further details  Time spent 40 minutes in seeing, examining patient, and over half of the total time was spent in  coordinating patient care on the floor or bedisde.  Thurnell Lose M.D on 02/01/2011 at 11:27 AM  [facility] Office  [phone]

## 2011-02-01 NOTE — ED Notes (Signed)
MD at bedside. Dr. Quay Burow at bedside

## 2011-02-01 NOTE — Consult Note (Signed)
FATIME BISWELL is an 71 y.o. female Married, G2P2  Menopause.  No HRT x >15 yrs  RP:  LLQ pain, discomfort x about a week and not feeling well  HPI:  Last IC 1 month ago with husband.  No abnormal vaginal d/c.  No vaginal bleeding.  Had a Dx of Diverticulitis with an acute episode about 2 yrs ago and Sx were similar.  Pt felt slightly feverish and had LLQ discomfort x 1 wk.  Constipated with no BP x many days prior to presentation, did pass a BM with laxative.  No UTI Sx.    Pertinent Gynecological History: Menses: Menopause Bleeding: none Blood transfusions: none Sexually transmitted diseases: no past history Previous GYN Procedures: none  Last mammogram: normal Date: Around 09/2010 per patient Last pap: normal Date: 2 yrs ago per patient OB History: G2P2 2 SVD about 40 yrs ago.  Menstrual History: Menopause around 71 yo. HRT x 5 yrs at that time, then Evista x a few yrs  No LMP recorded. Patient is postmenopausal.    Past Medical History  Diagnosis Date  . Hypertension   . Celiac disease     per Dr. Fuller Plan, is questionable.   . Diverticulitis     and diverticulosis in sigmoid colon   . Heart murmur   . IBS (irritable bowel syndrome)   . GERD (gastroesophageal reflux disease)   . Erosive esophagitis     with hiatal hernia, Barrett's esophagus 02/2004  . Degenerative joint disease     Past Surgical History  Procedure Date  . Appendectomy   . Carpal tunnel release   . Tonsillectomy   . Knee arthroscopy     Family History  Problem Relation Age of Onset  . Breast cancer Sister   . Heart disease Father   . Stroke Mother     multiple   Fam H/O Breast Ca in sister, post menopausal after 5 yrs of HRT          No h/o ovarian or colon ca per patient  Social History:  reports that she has never smoked. She has never used smokeless tobacco. She reports that she does not drink alcohol or use illicit drugs.  Allergies:  Allergies  Allergen Reactions  . Gluten Meal   . Ace  Inhibitors   . Ciprofloxacin   . Ibuprofen   . Levofloxacin     Prescriptions prior to admission  Medication Sig Dispense Refill  . acidophilus (RISAQUAD) CAPS Take 1 capsule by mouth daily.      Marland Kitchen amLODipine (NORVASC) 5 MG tablet Take 5 mg by mouth daily.      Marland Kitchen aspirin EC 81 MG tablet Take 81 mg by mouth daily.      . cholecalciferol (VITAMIN D) 1000 UNITS tablet Take 1,000 Units by mouth daily.      . Chromium Picolinate 200 MCG CAPS Take 1 capsule by mouth daily.      Marland Kitchen losartan-hydrochlorothiazide (HYZAAR) 100-12.5 MG per tablet Take 1 tablet by mouth daily.      . Magnesium 200 MG TABS Take 1 tablet by mouth daily.      . metoprolol succinate (TOPROL-XL) 50 MG 24 hr tablet Take 50 mg by mouth daily. Take with or immediately following a meal.      . omega-3 acid ethyl esters (LOVAZA) 1 G capsule Take 2 g by mouth daily.      . pantoprazole (PROTONIX) 40 MG tablet Take 40 mg by mouth 2 (two) times  daily.        Blood pressure 110/70, pulse 68, temperature 98.5 F (36.9 C), temperature source Oral, resp. rate 20, height 5' 0.25" (1.53 m), weight 63.617 kg (140 lb 4 oz), SpO2 97.00%. NAD, well oriented x 3 RCR no murmur heard Lungs clear bilaterally Breasts:  No mass, no nodule.  No axillary node felt Abdo:  Soft, no mass felt.  Tender in LLQ, but no rebound VE  Uterus and cervix very tender to mobilisation.  Normal size, anteverted.        Rt adnexa normal, Lt adnexa soft mass, mildly tender.        No abnormal vaginal d/c.        Vulva normal No inguinal LN felt Lower limbs normal  Results for orders placed during the hospital encounter of 01/31/11 (from the past 24 hour(s))  CBC     Status: Abnormal   Collection Time   01/31/11  6:00 PM      Component Value Range   WBC 16.3 (*) 4.0 - 10.5 (K/uL)   RBC 4.11  3.87 - 5.11 (MIL/uL)   Hemoglobin 12.6  12.0 - 15.0 (g/dL)   HCT 36.2  36.0 - 46.0 (%)   MCV 88.1  78.0 - 100.0 (fL)   MCH 30.7  26.0 - 34.0 (pg)   MCHC 34.8  30.0  - 36.0 (g/dL)   RDW 13.2  11.5 - 15.5 (%)   Platelets 328  150 - 400 (K/uL)  DIFFERENTIAL     Status: Abnormal   Collection Time   01/31/11  6:00 PM      Component Value Range   Neutrophils Relative 82 (*) 43 - 77 (%)   Neutro Abs 13.4 (*) 1.7 - 7.7 (K/uL)   Lymphocytes Relative 10 (*) 12 - 46 (%)   Lymphs Abs 1.6  0.7 - 4.0 (K/uL)   Monocytes Relative 8  3 - 12 (%)   Monocytes Absolute 1.3 (*) 0.1 - 1.0 (K/uL)   Eosinophils Relative 0  0 - 5 (%)   Eosinophils Absolute 0.1  0.0 - 0.7 (K/uL)   Basophils Relative 0  0 - 1 (%)   Basophils Absolute 0.0  0.0 - 0.1 (K/uL)  BASIC METABOLIC PANEL     Status: Abnormal   Collection Time   01/31/11  6:00 PM      Component Value Range   Sodium 129 (*) 135 - 145 (mEq/L)   Potassium 3.3 (*) 3.5 - 5.1 (mEq/L)   Chloride 91 (*) 96 - 112 (mEq/L)   CO2 25  19 - 32 (mEq/L)   Glucose, Bld 128 (*) 70 - 99 (mg/dL)   BUN 17  6 - 23 (mg/dL)   Creatinine, Ser 0.85  0.50 - 1.10 (mg/dL)   Calcium 9.1  8.4 - 10.5 (mg/dL)   GFR calc non Af Amer 67 (*) >90 (mL/min)   GFR calc Af Amer 77 (*) >90 (mL/min)  URINALYSIS, ROUTINE W REFLEX MICROSCOPIC     Status: Abnormal   Collection Time   01/31/11  6:54 PM      Component Value Range   Color, Urine YELLOW  YELLOW    APPearance CLEAR  CLEAR    Specific Gravity, Urine 1.013  1.005 - 1.030    pH 5.5  5.0 - 8.0    Glucose, UA NEGATIVE  NEGATIVE (mg/dL)   Hgb urine dipstick NEGATIVE  NEGATIVE    Bilirubin Urine NEGATIVE  NEGATIVE    Ketones, ur TRACE (*) NEGATIVE (  mg/dL)   Protein, ur NEGATIVE  NEGATIVE (mg/dL)   Urobilinogen, UA 0.2  0.0 - 1.0 (mg/dL)   Nitrite NEGATIVE  NEGATIVE    Leukocytes, UA SMALL (*) NEGATIVE   URINE MICROSCOPIC-ADD ON     Status: Abnormal   Collection Time   01/31/11  6:54 PM      Component Value Range   Squamous Epithelial / LPF FEW (*) RARE    WBC, UA 3-6  <3 (WBC/hpf)   Bacteria, UA FEW (*) RARE   CBC     Status: Abnormal   Collection Time   02/01/11  4:23 AM      Component  Value Range   WBC 15.3 (*) 4.0 - 10.5 (K/uL)   RBC 3.58 (*) 3.87 - 5.11 (MIL/uL)   Hemoglobin 11.0 (*) 12.0 - 15.0 (g/dL)   HCT 31.5 (*) 36.0 - 46.0 (%)   MCV 88.0  78.0 - 100.0 (fL)   MCH 30.7  26.0 - 34.0 (pg)   MCHC 34.9  30.0 - 36.0 (g/dL)   RDW 13.3  11.5 - 15.5 (%)   Platelets 294  150 - 400 (K/uL)  BASIC METABOLIC PANEL     Status: Abnormal   Collection Time   02/01/11  4:23 AM      Component Value Range   Sodium 131 (*) 135 - 145 (mEq/L)   Potassium 3.6  3.5 - 5.1 (mEq/L)   Chloride 98  96 - 112 (mEq/L)   CO2 23  19 - 32 (mEq/L)   Glucose, Bld 152 (*) 70 - 99 (mg/dL)   BUN 11  6 - 23 (mg/dL)   Creatinine, Ser 0.71  0.50 - 1.10 (mg/dL)   Calcium 8.5  8.4 - 10.5 (mg/dL)   GFR calc non Af Amer 84 (*) >90 (mL/min)   GFR calc Af Amer >90  >90 (mL/min)    US Transvaginal Non-ob  01/31/2011  *RADIOLOGY REPORT*  Clinical Data: Abnormal appearance of the left ovary on CT scan. Elevated white blood cell count.  Pain.  TRANSABDOMINAL AND TRANSVAGINAL ULTRASOUND OF PELVIS Technique:  Both transabdominal and transvaginal ultrasound examinations of the pelvis were performed. Transabdominal technique was performed for global imaging of the pelvis including uterus, ovaries, adnexal regions, and pelvic cul-de-sac.  Comparison: CT abdomen and pelvis 01/31/2011 at 1949 hours.   It was necessary to proceed with endovaginal exam following the transabdominal exam to visualize the left ovary.  Findings:  Uterus: Normal in size and appearance  Endometrium: Normal in thickness and appearance  Right ovary:  Normal appearance/no adnexal mass  Left ovary: Measures 5.9 x 4.8 x 3.6 cm.  Multiple hypoechoic foci are identified within the right ovary correlating with low attenuation seen on CT scan. There is increased signal on Doppler imaging in these locations.  Other findings: No free fluid  IMPRESSION: Abnormal appearance of the left ovary is compatible with tubo- ovarian abscess or neoplasm. Given elevated  white count, infection is favored. However, repeat ultrasound in 2-3 weeks after appropriate therapy is recommended to exclude neoplasm.  Original Report Authenticated By: Arvid Right. Luther Parody, M.D.   US Pelvis Complete  01/31/2011  *RADIOLOGY REPORT*  Clinical Data: Abnormal appearance of the left ovary on CT scan. Elevated white blood cell count.  Pain.  TRANSABDOMINAL AND TRANSVAGINAL ULTRASOUND OF PELVIS Technique:  Both transabdominal and transvaginal ultrasound examinations of the pelvis were performed. Transabdominal technique was performed for global imaging of the pelvis including uterus, ovaries, adnexal regions, and pelvic cul-de-sac.  Comparison:  CT abdomen and pelvis 01/31/2011 at 1949 hours.   It was necessary to proceed with endovaginal exam following the transabdominal exam to visualize the left ovary.  Findings:  Uterus: Normal in size and appearance  Endometrium: Normal in thickness and appearance  Right ovary:  Normal appearance/no adnexal mass  Left ovary: Measures 5.9 x 4.8 x 3.6 cm.  Multiple hypoechoic foci are identified within the right ovary correlating with low attenuation seen on CT scan. There is increased signal on Doppler imaging in these locations.  Other findings: No free fluid  IMPRESSION: Abnormal appearance of the left ovary is compatible with tubo- ovarian abscess or neoplasm. Given elevated white count, infection is favored. However, repeat ultrasound in 2-3 weeks after appropriate therapy is recommended to exclude neoplasm.  Original Report Authenticated By: Arvid Right. Luther Parody, M.D.   Ct Abdomen Pelvis W Contrast  01/31/2011  *RADIOLOGY REPORT*  Clinical Data: Lower quadrant pain.  Elevated white blood count.  CT ABDOMEN AND PELVIS WITH CONTRAST  Technique:  Multidetector CT imaging of the abdomen and pelvis was performed following the standard protocol during bolus administration of intravenous contrast.  Contrast: 74m OMNIPAQUE IOHEXOL 300 MG/ML IV SOLN  Comparison:  12/03/2008  Findings: The left ovary is markedly abnormal and changed since the prior study of 12/03/2008.  It now measures 5.7 x 4.4 x 3.5 cm and has multiple cystic areas within it as well as slight soft tissue inflammation around the ovary.  This could represent tumor or infection.  It is immediately adjacent to an area of extensive diverticulosis of the sigmoid portion of the colon but there is no finding of acute diverticulitis.  The liver, spleen, pancreas, gallbladder, adrenal glands, and kidneys are normal.  No dilated bowel.  The right ovary and uterus are normal.  No free fluid in the pelvis.  No acute osseous abnormalities.  IMPRESSION: The patient has developed an abnormal appearance of the left ovary. The ovary has enlarged and there are now  multiple cystic areas within the ovary.  Slight inflammatory changes around the ovary. This could be due to infection or tumor.  Original Report Authenticated By: JLarey Seat M.D.   UKoreaimages reviewed:  Left adnexal cystic mass 5.9 cm, probably ovarian cysts with possible hydrosalpinx.  No FF.  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  H/O Diverticulitis, currently active disease contributing to Sx or linked to LFaulk Pending Ca125 and CEA. Will discuss with Gyneco-Onco and hospitalist. Pelvic MRI to consider. Will finish full ABTx course before considering surgery.  Nicholas Ossa,MARIE-LYNE 02/01/2011, 11:50 AM

## 2011-02-01 NOTE — Progress Notes (Signed)
ANTIBIOTIC CONSULT NOTE - INITIAL  Pharmacy Consult for Gentamicin Indication: tuboovarian abscess (TOA)   Allergies  Allergen Reactions  . Ace Inhibitors   . Ciprofloxacin   . Ibuprofen   . Levofloxacin     Patient Measurements: Height: 5' 0.25" (153 cm) Weight: 140 lb 4 oz (63.617 kg) IBW/kg (Calculated) : 46.08  Adjusted Body Weight:   Vital Signs: Temp: 98.5 F (36.9 C) (01/22 0255) Temp src: Oral (01/22 0255) BP: 139/58 mmHg (01/22 0255) Pulse Rate: 81  (01/22 0255) Intake/Output from previous day: 01/21 0701 - 01/22 0700 In: 500 [I.V.:500] Out: 250 [Urine:250] Intake/Output from this shift: Total I/O In: 500 [I.V.:500] Out: 250 [Urine:250]  Labs:  Basename 01/31/11 1800  WBC 16.3*  HGB 12.6  PLT 328  LABCREA --  CREATININE 0.85   Estimated Creatinine Clearance: 50.2 ml/min (by C-G formula based on Cr of 0.85). No results found for this basename: VANCOTROUGH:2,VANCOPEAK:2,VANCORANDOM:2,GENTTROUGH:2,GENTPEAK:2,GENTRANDOM:2,TOBRATROUGH:2,TOBRAPEAK:2,TOBRARND:2,AMIKACINPEAK:2,AMIKACINTROU:2,AMIKACIN:2, in the last 72 hours   Microbiology: No results found for this or any previous visit (from the past 720 hour(s)).  Medical History: Past Medical History  Diagnosis Date  . Hypertension   . Celiac disease     per Dr. Fuller Plan, is questionable.   . Diverticulitis     and diverticulosis in sigmoid colon   . Heart murmur   . IBS (irritable bowel syndrome)   . GERD (gastroesophageal reflux disease)   . Erosive esophagitis     with hiatal hernia, Barrett's esophagus 02/2004  . Degenerative joint disease     Medications:  Anti-infectives     Start     Dose/Rate Route Frequency Ordered Stop   02/02/11 0800   gentamicin (GARAMYCIN) 270 mg in dextrose 5 % 100 mL IVPB        270 mg 106.8 mL/hr over 60 Minutes Intravenous Every 24 hours 02/01/11 0329     02/01/11 0600   ampicillin (OMNIPEN) 2 g in sodium chloride 0.9 % 50 mL IVPB        2 g 150 mL/hr over  20 Minutes Intravenous 4 times per day 02/01/11 0304     02/01/11 0600   clindamycin (CLEOCIN) IVPB 900 mg        900 mg 100 mL/hr over 30 Minutes Intravenous 3 times per day 02/01/11 0304     02/01/11 0045   gentamicin (GARAMYCIN) 440 mg in dextrose 5 % 100 mL IVPB        440 mg 111 mL/hr over 60 Minutes Intravenous  Once 02/01/11 0035     02/01/11 0015   clindamycin (CLEOCIN) IVPB 600 mg        600 mg 100 mL/hr over 30 Minutes Intravenous  Once 02/01/11 0009     02/01/11 0015   ampicillin (OMNIPEN) 2 g in sodium chloride 0.9 % 50 mL IVPB        2 g 150 mL/hr over 20 Minutes Intravenous  Once 02/01/11 0009 02/01/11 0209         Assessment: Patient with ovarian cystic mass.   MD wishes triple antibiotic therapy for TOA.  Will dose Gentamicin at 100m/kg q24hr.  1st dose in ED was 749mkg.  Goal of Therapy:  Gentamicin 11m80mg q24hr   Plan:  270m70msing adjusted weight) iv gentamicin q24hr  GrimTyler Deislian Crowford 02/01/2011,3:30 AM

## 2011-02-01 NOTE — Consult Note (Signed)
Consult Note: Gyn-Onc  [name] 71 y.o. female  CC:  Chief Complaint  Patient presents with  . Abnormal Lab    HPI: Patient is seen today in consultation at the request of Dr. Dellis Filbert and Dr. Candiss Norse.  Interval History: [name] is a very pleasant 71 year old gravida 2 para 2 who gives a one-month history of not feeling particularly well.  She states this sometimes happens in his dietary related due to her celiac disease. She presented with a one-week history of left lower quadrant pain. This coincides with her not having a bowel movement for approximately one week's time as well. She presented to the emergency room at which time her labs are significant for sodium of 129, potassium 3.3, chloride of 91. In addition, her WBCs were 16.3 with a left shift. CT scan of the abdomen and pelvis revealed an abnormal appearance of the left ovary, it was enlarged with multiple cystic areas within the ovary. There was slight inflammatory changes around the ovary. Radiology could not rule out infection such as a tubo-ovarian abscess versus neoplasm.  Ultrasound was confirmatory. CA 125 was 17.8. She was seen by Dr. Burnis Medin follow this morning. Exam at that time revealed the uterus and cervix very tender to mobilization. The right adnexa was normal. The left adnexa reveals a soft mass which is mildly tender.  The patient states that she is feeling much better than she was when she was admitted.  Review of Systems: She denies any current nausea or vomiting. She is feeling much better than she did this morning to  Current Meds: @ENCMED @  Allergy:  Allergies  Allergen Reactions  . Gluten Meal   . Ace Inhibitors   . Ciprofloxacin   . Ibuprofen   . Levofloxacin     Social Hx:   History   Social History  . Marital Status: Married    Spouse Name: N/A    Number of Children: N/A  . Years of Education: N/A   Occupational History  . Not on file.   Social History Main Topics  . Smoking status: Never  Smoker   . Smokeless tobacco: Never Used  . Alcohol Use: No  . Drug Use: No  . Sexually Active: Not on file   Other Topics Concern  . Not on file   Social History Narrative   Lives at home, with husband. Has a son and daughter. Still ambulatory, still active. [name] daughter [phone]    Past Surgical Hx:  Past Surgical History  Procedure Date  . Appendectomy   . Carpal tunnel release   . Tonsillectomy   . Knee arthroscopy     Past Medical Hx:  Past Medical History  Diagnosis Date  . Hypertension   . Celiac disease     per Dr. Fuller Plan, is questionable.   . Diverticulitis     and diverticulosis in sigmoid colon   . Heart murmur   . IBS (irritable bowel syndrome)   . GERD (gastroesophageal reflux disease)   . Erosive esophagitis     with hiatal hernia, Barrett's esophagus 02/2004  . Degenerative joint disease     Family Hx:  Family History  Problem Relation Age of Onset  . Breast cancer Sister   . Heart disease Father   . Stroke Mother     multiple    Vitals:  Blood pressure 110/70, pulse 68, temperature 98.5 F (36.9 C), temperature source Oral, resp. rate 20, height 5' 0.25" (1.53 m), weight 140  lb 4 oz (63.617 kg), SpO2 97.00%.  Physical Exam: Well-nourished well-developed female in no acute distress. Abdomen: Soft nontender nondistended there are no palpable masses or hepatosplenomegaly.   Assessment/Plan: 71 year old with a left ovarian tubo-ovarian abscess versus neoplastic process. Her white blood count is 16 she does have a left shift. Since being started on triple antibiotics she's been feeling much better. She is currently on ampicillin gentamicin clindamycin. She is tolerating a regular diet. On imaging she does appear to have a 5.9 x 4.8 x 3.6 cm left ovarian mass. He has multiple cystic areas within it as well as slight soft tissue inflammation around the ovary. There is no comment regarding lymphadenopathy, ascites, or carcinomatosis. Her CA 125 was  normal at 17.8. I had a lengthy discussion with the patient and her husband regarding her management recommendations at this point. I would recommend continuing her antibiotics for 2 weeks. Once her primary team feels that she may switch to oral antibiotics I would continue her on those to complete a two-week course. She would then require repeat imaging with ultrasound in 2-3 weeks to evaluate the left adnexa and determine whether or not surgical management is indicated. I have communicated with Dr. Candiss Norse. I will contact Dr. Dellis Filbert with our recommendations. The patient knows that we'll be happy to see her should the need arise. Thank you very much for the opportunity to participate in his consultation.   Nancy Marus A., MD 02/01/2011, 2:37 PM

## 2011-02-02 ENCOUNTER — Other Ambulatory Visit: Payer: Self-pay | Admitting: *Deleted

## 2011-02-02 DIAGNOSIS — N83209 Unspecified ovarian cyst, unspecified side: Secondary | ICD-10-CM

## 2011-02-02 LAB — BASIC METABOLIC PANEL
BUN: 9 mg/dL (ref 6–23)
CO2: 26 mEq/L (ref 19–32)
Chloride: 98 mEq/L (ref 96–112)
Creatinine, Ser: 0.89 mg/dL (ref 0.50–1.10)
Glucose, Bld: 105 mg/dL — ABNORMAL HIGH (ref 70–99)
Potassium: 3.5 mEq/L (ref 3.5–5.1)

## 2011-02-02 LAB — CBC
HCT: 31.6 % — ABNORMAL LOW (ref 36.0–46.0)
Hemoglobin: 10.9 g/dL — ABNORMAL LOW (ref 12.0–15.0)
MCV: 89 fL (ref 78.0–100.0)
RBC: 3.55 MIL/uL — ABNORMAL LOW (ref 3.87–5.11)
RDW: 13.4 % (ref 11.5–15.5)
WBC: 13.8 10*3/uL — ABNORMAL HIGH (ref 4.0–10.5)

## 2011-02-02 MED ORDER — CLINDAMYCIN PHOSPHATE 900 MG/50ML IV SOLN
900.0000 mg | Freq: Three times a day (TID) | INTRAVENOUS | Status: DC
  Administered 2011-02-02 – 2011-02-04 (×6): 900 mg via INTRAVENOUS
  Filled 2011-02-02 (×9): qty 50

## 2011-02-02 MED ORDER — SODIUM CHLORIDE 0.9 % IV SOLN
INTRAVENOUS | Status: DC
  Administered 2011-02-03: 21:00:00 via INTRAVENOUS

## 2011-02-02 MED ORDER — PANTOPRAZOLE SODIUM 40 MG PO TBEC
40.0000 mg | DELAYED_RELEASE_TABLET | Freq: Two times a day (BID) | ORAL | Status: DC
  Administered 2011-02-02 – 2011-02-04 (×4): 40 mg via ORAL
  Filled 2011-02-02 (×4): qty 1

## 2011-02-02 MED ORDER — SODIUM CHLORIDE 0.9 % IV SOLN
2.0000 g | Freq: Four times a day (QID) | INTRAVENOUS | Status: DC
  Administered 2011-02-02 – 2011-02-04 (×8): 2 g via INTRAVENOUS
  Filled 2011-02-02 (×12): qty 2000

## 2011-02-02 NOTE — Discharge Planning (Signed)
Per Dr Alycia Rossetti patient is scheduled for follow-up ultrasound 02/25/11 @ [facility] @ 11:00 arrive at 10:45 and a follow-up appointment with Dr Alycia Rossetti 03/03/11 @11 :30 arrive 11:00  [facility].

## 2011-02-02 NOTE — Progress Notes (Signed)
UR completed 

## 2011-02-02 NOTE — Progress Notes (Signed)
[name] [id number] is a 71 y.o. female,  Outpatient Primary MD for the patient is Velazquez,[name], MD, MD  Chief Complaint  Patient presents with  . Abnormal Lab        Subjective:   States abd. Better, denies N/V - tolerating po well   Objective:   Filed Vitals:   02/01/11 1400 02/01/11 2105 02/02/11 0619 02/02/11 1430  BP: 128/74 110/70 121/72 169/71  Pulse: 91 79 81 76  Temp: 98 F (36.7 C) 98.3 F (36.8 C) 98.7 F (37.1 C) 97.8 F (36.6 C)  TempSrc: Oral Oral Oral Oral  Resp: 18 20 20 18   Height:      Weight:      SpO2: 95% 97% 95% 98%    Wt Readings from Last 3 Encounters:  02/01/11 63.617 kg (140 lb 4 oz)  12/17/08 66.951 kg (147 lb 9.6 oz)     Intake/Output Summary (Last 24 hours) at 02/02/11 1920 Last data filed at 02/02/11 1752  Gross per 24 hour  Intake 646.68 ml  Output   1700 ml  Net -1053.32 ml    Exam Awake Alert, Oriented *3, [place].AT,PERRAL Supple Neck,No JVD, No cervical lymphadenopathy appriciated.  Symmetrical Chest wall movement, Good air movement bilaterally, CTAB RRR,No Gallops,Rubs or new Murmurs, No Parasternal Heave +ve B.Sounds, Abd Soft, mild LLQ tenderness, No organomegaly appreciated, No rebound -guarding or rigidity. No Cyanosis, Clubbing or edema     Data Review  CBC  Lab 02/02/11 0426 02/01/11 0423 01/31/11 1800  WBC 13.8* 15.3* 16.3*  HGB 10.9* 11.0* 12.6  HCT 31.6* 31.5* 36.2  PLT 280 294 328  MCV 89.0 88.0 88.1  MCH 30.7 30.7 30.7  MCHC 34.5 34.9 34.8  RDW 13.4 13.3 13.2  LYMPHSABS -- -- 1.6  MONOABS -- -- 1.3*  EOSABS -- -- 0.1  BASOSABS -- -- 0.0  BANDABS -- -- --    Chemistries   Lab 02/02/11 0426 02/01/11 0423 01/31/11 1800  NA 132* 131* 129*  K 3.5 3.6 3.3*  CL 98 98 91*  CO2 26 23 25   GLUCOSE 105* 152* 128*  BUN 9 11 17   CREATININE 0.89 0.71 0.85  CALCIUM 8.6 8.5 9.1  MG -- -- --  AST -- -- --  ALT -- -- --  ALKPHOS -- -- --  BILITOT -- -- --    ------------------------------------------------------------------------------------------------------------------ estimated creatinine clearance is 47.9 ml/min (by C-G formula based on Cr of 0.89). ------------------------------------------------------------------------------------------------------------------ No results found for this basename: HGBA1C:2 in the last 72 hours ------------------------------------------------------------------------------------------------------------------ No results found for this basename: CHOL:2,HDL:2,LDLCALC:2,TRIG:2,CHOLHDL:2,LDLDIRECT:2 in the last 72 hours ------------------------------------------------------------------------------------------------------------------ No results found for this basename: TSH,T4TOTAL,FREET3,T3FREE,THYROIDAB in the last 72 hours ------------------------------------------------------------------------------------------------------------------ No results found for this basename: VITAMINB12:2,FOLATE:2,FERRITIN:2,TIBC:2,IRON:2,RETICCTPCT:2 in the last 72 hours  Coagulation profile No results found for this basename: INR:5,PROTIME:5 in the last 168 hours  No results found for this basename: DDIMER:2 in the last 72 hours  Cardiac Enzymes No results found for this basename: CK:3,CKMB:3,TROPONINI:3,MYOGLOBIN:3 in the last 168 hours ------------------------------------------------------------------------------------------------------------------ No components found with this basename: POCBNP:3  Micro Results Recent Results (from the past 240 hour(s))  URINE CULTURE     Status: Normal   Collection Time   01/31/11  6:54 PM      Component Value Range Status Comment   Specimen Description URINE, CLEAN CATCH   Final    Special Requests NONE   Final    Setup Time [id number]   Final    Colony Count NO GROWTH   Final  Culture NO GROWTH   Final    Report Status 02/01/2011 FINAL   Final   CULTURE, BLOOD (SINGLE)     Status:  Normal (Preliminary result)   Collection Time   02/01/11  4:23 AM      Component Value Range Status Comment   Specimen Description BLOOD RIGHT ARM   Final    Special Requests BOTTLES DRAWN AEROBIC AND ANAEROBIC 5CC   Final    Setup Time [id number]   Final    Culture     Final    Value:        BLOOD CULTURE RECEIVED NO GROWTH TO DATE CULTURE WILL BE HELD FOR 5 DAYS BEFORE ISSUING A FINAL NEGATIVE REPORT   Report Status PENDING   Incomplete     Radiology Reports US Transvaginal Non-ob  01/31/2011  *RADIOLOGY REPORT*  Clinical Data: Abnormal appearance of the left ovary on CT scan. Elevated white blood cell count.  Pain.  TRANSABDOMINAL AND TRANSVAGINAL ULTRASOUND OF PELVIS Technique:  Both transabdominal and transvaginal ultrasound examinations of the pelvis were performed. Transabdominal technique was performed for global imaging of the pelvis including uterus, ovaries, adnexal regions, and pelvic cul-de-sac.  Comparison: CT abdomen and pelvis 01/31/2011 at 1949 hours.   It was necessary to proceed with endovaginal exam following the transabdominal exam to visualize the left ovary.  Findings:  Uterus: Normal in size and appearance  Endometrium: Normal in thickness and appearance  Right ovary:  Normal appearance/no adnexal mass  Left ovary: Measures 5.9 x 4.8 x 3.6 cm.  Multiple hypoechoic foci are identified within the right ovary correlating with low attenuation seen on CT scan. There is increased signal on Doppler imaging in these locations.  Other findings: No free fluid  IMPRESSION: Abnormal appearance of the left ovary is compatible with tubo- ovarian abscess or neoplasm. Given elevated white count, infection is favored. However, repeat ultrasound in 2-3 weeks after appropriate therapy is recommended to exclude neoplasm.  Original Report Authenticated By: Arvid Right. Luther Parody, M.D.   US Pelvis Complete  01/31/2011  *RADIOLOGY REPORT*  Clinical Data: Abnormal appearance of the left ovary on CT  scan. Elevated white blood cell count.  Pain.  TRANSABDOMINAL AND TRANSVAGINAL ULTRASOUND OF PELVIS Technique:  Both transabdominal and transvaginal ultrasound examinations of the pelvis were performed. Transabdominal technique was performed for global imaging of the pelvis including uterus, ovaries, adnexal regions, and pelvic cul-de-sac.  Comparison: CT abdomen and pelvis 01/31/2011 at 1949 hours.   It was necessary to proceed with endovaginal exam following the transabdominal exam to visualize the left ovary.  Findings:  Uterus: Normal in size and appearance  Endometrium: Normal in thickness and appearance  Right ovary:  Normal appearance/no adnexal mass  Left ovary: Measures 5.9 x 4.8 x 3.6 cm.  Multiple hypoechoic foci are identified within the right ovary correlating with low attenuation seen on CT scan. There is increased signal on Doppler imaging in these locations.  Other findings: No free fluid  IMPRESSION: Abnormal appearance of the left ovary is compatible with tubo- ovarian abscess or neoplasm. Given elevated white count, infection is favored. However, repeat ultrasound in 2-3 weeks after appropriate therapy is recommended to exclude neoplasm.  Original Report Authenticated By: Arvid Right. Luther Parody, M.D.   Ct Abdomen Pelvis W Contrast  01/31/2011  *RADIOLOGY REPORT*  Clinical Data: Lower quadrant pain.  Elevated white blood count.  CT ABDOMEN AND PELVIS WITH CONTRAST  Technique:  Multidetector CT imaging of the abdomen and pelvis was performed following  the standard protocol during bolus administration of intravenous contrast.  Contrast: 6m OMNIPAQUE IOHEXOL 300 MG/ML IV SOLN  Comparison: 12/03/2008  Findings: The left ovary is markedly abnormal and changed since the prior study of 12/03/2008.  It now measures 5.7 x 4.4 x 3.5 cm and has multiple cystic areas within it as well as slight soft tissue inflammation around the ovary.  This could represent tumor or infection.  It is immediately adjacent to  an area of extensive diverticulosis of the sigmoid portion of the colon but there is no finding of acute diverticulitis.  The liver, spleen, pancreas, gallbladder, adrenal glands, and kidneys are normal.  No dilated bowel.  The right ovary and uterus are normal.  No free fluid in the pelvis.  No acute osseous abnormalities.  IMPRESSION: The patient has developed an abnormal appearance of the left ovary. The ovary has enlarged and there are now  multiple cystic areas within the ovary.  Slight inflammatory changes around the ovary. This could be due to infection or tumor.  Original Report Authenticated By: JLarey Seat M.D.    Scheduled Meds:    . amLODipine  5 mg Oral Daily  . ampicillin (OMNIPEN) IV  2 g Intravenous Q6H  . aspirin EC  81 mg Oral Daily  . clindamycin (CLEOCIN) IV  900 mg Intravenous Q8H  . docusate sodium  100 mg Oral BID  . gentamicin  270 mg Intravenous Q24H  . heparin  5,000 Units Subcutaneous Q8H  . hydrochlorothiazide  12.5 mg Oral Daily  . losartan  100 mg Oral Daily  . metoprolol succinate  50 mg Oral Daily  . pantoprazole  40 mg Oral BID AC  . potassium chloride  40 mEq Oral Once  . senna  1 tablet Oral BID  . DISCONTD: ampicillin (OMNIPEN) IV  2 g Intravenous Q6H  . DISCONTD: clindamycin (CLEOCIN) IV  900 mg Intravenous Q8H  . DISCONTD: pantoprazole  40 mg Oral BID   Continuous Infusions:    . sodium chloride 50 mL/hr at 02/02/11 0154   PRN Meds:.acetaminophen, acetaminophen, morphine, ondansetron (ZOFRAN) IV, ondansetron  Assessment & Plan   1. 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 Imaging recommendation is to repeat in 2-3 wks, and pt has been set up to f/u for [place]on 2[place]15 and with Dr GAlycia Rossettion 03/03/11  2. Hyponatremia: continue  IVF's, will d/c HCTZ.  3. HypoK: recheck in am  and follow 4. HTN: Continue home amlodipine, ASA 81, Losartan, Toprol. HCTZ dc'ed for now as above  Gitty Osterlund C M.D on 02/02/2011 at 7:20 PM  [facility] Office  [phone]

## 2011-02-02 NOTE — Progress Notes (Signed)
ANTIBIOTIC CONSULT NOTE - INITIAL  Pharmacy Consult for Gentamicin Indication: Tubo-ovarian abscess (TOA)  Allergies  Allergen Reactions  . Gluten Meal   . Ace Inhibitors   . Ciprofloxacin   . Ibuprofen   . Levofloxacin    Patient Measurements: Height: 5' 0.25" (153 cm) Weight: 140 lb 4 oz (63.617 kg) IBW/kg (Calculated) : 46.08    Vital Signs: Temp: 97.8 F (36.6 C) (01/23 1430) Temp src: Oral (01/23 1430) BP: 129/72 mmHg (01/23 2003) Pulse Rate: 93  (01/23 2003) Intake/Output from previous day: 01/22 0701 - 01/23 0700 In: 429.1 [P.O.:120; I.V.:309.1] Out: 1550 [Urine:1550] Intake/Output from this shift:    Labs:  Basename 02/02/11 0426 02/01/11 0423 01/31/11 1800  WBC 13.8* 15.3* 16.3*  HGB 10.9* 11.0* 12.6  PLT 280 294 328  LABCREA -- -- --  CREATININE 0.89 0.71 0.85   Estimated Creatinine Clearance: 47.9 ml/min (by C-G formula based on Cr of 0.89).  Basename 02/02/11 1750  VANCOTROUGH --  Corlis Leak --  Jake Michaelis --  GENTTROUGH --  GENTPEAK --  GENTRANDOM 1.9  TOBRATROUGH --  TOBRAPEAK --  TOBRARND --  AMIKACINPEAK --  AMIKACINTROU --  AMIKACIN --   Microbiology: Recent Results (from the past 720 hour(s))  URINE CULTURE     Status: Normal   Collection Time   01/31/11  6:54 PM      Component Value Range Status Comment   Specimen Description URINE, CLEAN CATCH   Final    Special Requests NONE   Final    Setup Time [id number]   Final    Colony Count NO GROWTH   Final    Culture NO GROWTH   Final    Report Status 02/01/2011 FINAL   Final   CULTURE, BLOOD (SINGLE)     Status: Normal (Preliminary result)   Collection Time   02/01/11  4:23 AM      Component Value Range Status Comment   Specimen Description BLOOD RIGHT ARM   Final    Special Requests BOTTLES DRAWN AEROBIC AND ANAEROBIC 5CC   Final    Setup Time [id number]   Final    Culture     Final    Value:        BLOOD CULTURE RECEIVED NO GROWTH TO DATE CULTURE WILL BE HELD FOR 5 DAYS  BEFORE ISSUING A FINAL NEGATIVE REPORT   Report Status PENDING   Incomplete    Medical History: Past Medical History  Diagnosis Date  . Hypertension   . Celiac disease     per Dr. Fuller Plan, is questionable.   . Diverticulitis     and diverticulosis in sigmoid colon   . Heart murmur   . IBS (irritable bowel syndrome)   . GERD (gastroesophageal reflux disease)   . Erosive esophagitis     with hiatal hernia, Barrett's esophagus 02/2004  . Degenerative joint disease    Medications:  Anti-infectives     Start     Dose/Rate Route Frequency Ordered Stop   02/02/11 1600   ampicillin (OMNIPEN) 2 g in sodium chloride 0.9 % 50 mL IVPB        2 g 150 mL/hr over 20 Minutes Intravenous 4 times per day 02/02/11 1518     02/02/11 1600   clindamycin (CLEOCIN) IVPB 900 mg        900 mg 100 mL/hr over 30 Minutes Intravenous Every 8 hours 02/02/11 1519     02/02/11 0800   gentamicin (GARAMYCIN) 270 mg in dextrose 5 %  100 mL IVPB        270 mg 106.8 mL/hr over 60 Minutes Intravenous Every 24 hours 02/01/11 0329     02/01/11 1200   clindamycin (CLEOCIN) IVPB 900 mg  Status:  Discontinued        900 mg 100 mL/hr over 30 Minutes Intravenous 3 times per day 02/01/11 0304 02/02/11 1519   02/01/11 0600   ampicillin (OMNIPEN) 2 g in sodium chloride 0.9 % 50 mL IVPB  Status:  Discontinued        2 g 150 mL/hr over 20 Minutes Intravenous 4 times per day 02/01/11 0304 02/02/11 1518   02/01/11 0045   gentamicin (GARAMYCIN) 440 mg in dextrose 5 % 100 mL IVPB        440 mg 111 mL/hr over 60 Minutes Intravenous  Once 02/01/11 0035 02/01/11 0412   02/01/11 0015   clindamycin (CLEOCIN) IVPB 600 mg        600 mg 100 mL/hr over 30 Minutes Intravenous  Once 02/01/11 0009 02/01/11 0456   02/01/11 0015   ampicillin (OMNIPEN) 2 g in sodium chloride 0.9 % 50 mL IVPB        2 g 150 mL/hr over 20 Minutes Intravenous  Once 02/01/11 0009 02/01/11 0209         Assessment:  Triple antibiotic therapy for TOA:  Gentamicin, Clindamycin, Ampicillin.  Gent level drawn 9.5hrs after dose demonstrates adequate clearance.  Afebrile.  SCr stable.  Urine cx negative.  Blood cx in process.  Goal of Therapy:  Eradication of infection. Appropriate regimen for renal fxn.  Plan:  Cont Gent 214m IV q24h. F/u labs and cultures daily.  PLolita Patella1/23/2013,8:23 PM

## 2011-02-03 LAB — BASIC METABOLIC PANEL
BUN: 6 mg/dL (ref 6–23)
Creatinine, Ser: 0.89 mg/dL (ref 0.50–1.10)
GFR calc Af Amer: 73 mL/min — ABNORMAL LOW (ref >90)
GFR calc non Af Amer: 63 mL/min — ABNORMAL LOW (ref >90)
Potassium: 3.6 mEq/L (ref 3.5–5.1)

## 2011-02-03 LAB — CBC
HCT: 28.8 % — ABNORMAL LOW (ref 36.0–46.0)
Hemoglobin: 9.5 g/dL — ABNORMAL LOW (ref 12.0–15.0)
MCH: 29.2 pg (ref 26.0–34.0)
MCHC: 33 g/dL (ref 30.0–36.0)
MCV: 88.6 fL (ref 78.0–100.0)
RBC: 3.25 MIL/uL — ABNORMAL LOW (ref 3.87–5.11)

## 2011-02-03 NOTE — Progress Notes (Signed)
[name] [id number] is a 71 y.o. female,  Outpatient Primary MD for the patient is MCNEILL,[name], MD, MD  Chief Complaint  Patient presents with  . Abnormal Lab        Subjective:   States just did not feel well ealier this am, but better this pm  Objective:   Filed Vitals:   02/02/11 2138 02/03/11 0516 02/03/11 0845 02/03/11 1300  BP: 113/74 118/64 99/62 125/72  Pulse: 79 65 83 80  Temp: 99.7 F (37.6 C) 97.8 F (36.6 C) 98.6 F (37 C) 99.3 F (37.4 C)  TempSrc: Oral Oral Oral Oral  Resp: 18 18 18 20   Height:      Weight:      SpO2: 97% 99% 96% 96%    Wt Readings from Last 3 Encounters:  02/01/11 63.617 kg (140 lb 4 oz)  12/17/08 66.951 kg (147 lb 9.6 oz)     Intake/Output Summary (Last 24 hours) at 02/03/11 1359 Last data filed at 02/03/11 1334  Gross per 24 hour  Intake 1628.43 ml  Output   1650 ml  Net -21.57 ml    Exam Awake Alert, Oriented *3, [place].AT,PERRAL Supple Neck,No JVD, No cervical lymphadenopathy appriciated.  Symmetrical Chest wall movement, Good air movement bilaterally, CTAB RRR,No Gallops,Rubs or new Murmurs, No Parasternal Heave +ve B.Sounds, Abd Soft, mild LLQ tenderness, No organomegaly appreciated, No rebound -guarding or rigidity. No Cyanosis, Clubbing or edema     Data Review  CBC  Lab 02/03/11 0345 02/02/11 0426 02/01/11 0423 01/31/11 1800  WBC 11.9* 13.8* 15.3* 16.3*  HGB 9.5* 10.9* 11.0* 12.6  HCT 28.8* 31.6* 31.5* 36.2  PLT 276 280 294 328  MCV 88.6 89.0 88.0 88.1  MCH 29.2 30.7 30.7 30.7  MCHC 33.0 34.5 34.9 34.8  RDW 13.3 13.4 13.3 13.2  LYMPHSABS -- -- -- 1.6  MONOABS -- -- -- 1.3*  EOSABS -- -- -- 0.1  BASOSABS -- -- -- 0.0  BANDABS -- -- -- --    Chemistries   Lab 02/03/11 0345 02/02/11 0426 02/01/11 0423 01/31/11 1800  NA 133* 132* 131* 129*  K 3.6 3.5 3.6 3.3*  CL 101 98 98 91*  CO2 24 26 23 25   GLUCOSE 101* 105* 152* 128*  BUN 6 9 11 17   CREATININE 0.89 0.89 0.71 0.85  CALCIUM 8.7 8.6  8.5 9.1  MG -- -- -- --  AST -- -- -- --  ALT -- -- -- --  ALKPHOS -- -- -- --  BILITOT -- -- -- --   ------------------------------------------------------------------------------------------------------------------ estimated creatinine clearance is 47.9 ml/min (by C-G formula based on Cr of 0.89). ------------------------------------------------------------------------------------------------------------------ No results found for this basename: HGBA1C:2 in the last 72 hours ------------------------------------------------------------------------------------------------------------------ No results found for this basename: CHOL:2,HDL:2,LDLCALC:2,TRIG:2,CHOLHDL:2,LDLDIRECT:2 in the last 72 hours ------------------------------------------------------------------------------------------------------------------ No results found for this basename: TSH,T4TOTAL,FREET3,T3FREE,THYROIDAB in the last 72 hours ------------------------------------------------------------------------------------------------------------------ No results found for this basename: VITAMINB12:2,FOLATE:2,FERRITIN:2,TIBC:2,IRON:2,RETICCTPCT:2 in the last 72 hours  Coagulation profile No results found for this basename: INR:5,PROTIME:5 in the last 168 hours  No results found for this basename: DDIMER:2 in the last 72 hours  Cardiac Enzymes No results found for this basename: CK:3,CKMB:3,TROPONINI:3,MYOGLOBIN:3 in the last 168 hours ------------------------------------------------------------------------------------------------------------------ No components found with this basename: POCBNP:3  Micro Results Recent Results (from the past 240 hour(s))  URINE CULTURE     Status: Normal   Collection Time   01/31/11  6:54 PM      Component Value Range Status Comment   Specimen Description URINE, CLEAN  CATCH   Final    Special Requests NONE   Final    Setup Time [id number]   Final    Colony Count NO GROWTH   Final     Culture NO GROWTH   Final    Report Status 02/01/2011 FINAL   Final   CULTURE, BLOOD (SINGLE)     Status: Normal (Preliminary result)   Collection Time   02/01/11  4:23 AM      Component Value Range Status Comment   Specimen Description BLOOD RIGHT ARM   Final    Special Requests BOTTLES DRAWN AEROBIC AND ANAEROBIC 5CC   Final    Setup Time [id number]   Final    Culture     Final    Value:        BLOOD CULTURE RECEIVED NO GROWTH TO DATE CULTURE WILL BE HELD FOR 5 DAYS BEFORE ISSUING A FINAL NEGATIVE REPORT   Report Status PENDING   Incomplete     Radiology Reports US Transvaginal Non-ob  01/31/2011  *RADIOLOGY REPORT*  Clinical Data: Abnormal appearance of the left ovary on CT scan. Elevated white blood cell count.  Pain.  TRANSABDOMINAL AND TRANSVAGINAL ULTRASOUND OF PELVIS Technique:  Both transabdominal and transvaginal ultrasound examinations of the pelvis were performed. Transabdominal technique was performed for global imaging of the pelvis including uterus, ovaries, adnexal regions, and pelvic cul-de-sac.  Comparison: CT abdomen and pelvis 01/31/2011 at 1949 hours.   It was necessary to proceed with endovaginal exam following the transabdominal exam to visualize the left ovary.  Findings:  Uterus: Normal in size and appearance  Endometrium: Normal in thickness and appearance  Right ovary:  Normal appearance/no adnexal mass  Left ovary: Measures 5.9 x 4.8 x 3.6 cm.  Multiple hypoechoic foci are identified within the right ovary correlating with low attenuation seen on CT scan. There is increased signal on Doppler imaging in these locations.  Other findings: No free fluid  IMPRESSION: Abnormal appearance of the left ovary is compatible with tubo- ovarian abscess or neoplasm. Given elevated white count, infection is favored. However, repeat ultrasound in 2-3 weeks after appropriate therapy is recommended to exclude neoplasm.  Original Report Authenticated By: Arvid Right. Luther Parody, M.D.   US  Pelvis Complete  01/31/2011  *RADIOLOGY REPORT*  Clinical Data: Abnormal appearance of the left ovary on CT scan. Elevated white blood cell count.  Pain.  TRANSABDOMINAL AND TRANSVAGINAL ULTRASOUND OF PELVIS Technique:  Both transabdominal and transvaginal ultrasound examinations of the pelvis were performed. Transabdominal technique was performed for global imaging of the pelvis including uterus, ovaries, adnexal regions, and pelvic cul-de-sac.  Comparison: CT abdomen and pelvis 01/31/2011 at 1949 hours.   It was necessary to proceed with endovaginal exam following the transabdominal exam to visualize the left ovary.  Findings:  Uterus: Normal in size and appearance  Endometrium: Normal in thickness and appearance  Right ovary:  Normal appearance/no adnexal mass  Left ovary: Measures 5.9 x 4.8 x 3.6 cm.  Multiple hypoechoic foci are identified within the right ovary correlating with low attenuation seen on CT scan. There is increased signal on Doppler imaging in these locations.  Other findings: No free fluid  IMPRESSION: Abnormal appearance of the left ovary is compatible with tubo- ovarian abscess or neoplasm. Given elevated white count, infection is favored. However, repeat ultrasound in 2-3 weeks after appropriate therapy is recommended to exclude neoplasm.  Original Report Authenticated By: Arvid Right. Luther Parody, M.D.   Ct Abdomen Pelvis W Contrast  01/31/2011  *  RADIOLOGY REPORT*  Clinical Data: Lower quadrant pain.  Elevated white blood count.  CT ABDOMEN AND PELVIS WITH CONTRAST  Technique:  Multidetector CT imaging of the abdomen and pelvis was performed following the standard protocol during bolus administration of intravenous contrast.  Contrast: 60m OMNIPAQUE IOHEXOL 300 MG/ML IV SOLN  Comparison: 12/03/2008  Findings: The left ovary is markedly abnormal and changed since the prior study of 12/03/2008.  It now measures 5.7 x 4.4 x 3.5 cm and has multiple cystic areas within it as well as slight soft  tissue inflammation around the ovary.  This could represent tumor or infection.  It is immediately adjacent to an area of extensive diverticulosis of the sigmoid portion of the colon but there is no finding of acute diverticulitis.  The liver, spleen, pancreas, gallbladder, adrenal glands, and kidneys are normal.  No dilated bowel.  The right ovary and uterus are normal.  No free fluid in the pelvis.  No acute osseous abnormalities.  IMPRESSION: The patient has developed an abnormal appearance of the left ovary. The ovary has enlarged and there are now  multiple cystic areas within the ovary.  Slight inflammatory changes around the ovary. This could be due to infection or tumor.  Original Report Authenticated By: JLarey Seat M.D.    Scheduled Meds:    . amLODipine  5 mg Oral Daily  . ampicillin (OMNIPEN) IV  2 g Intravenous Q6H  . aspirin EC  81 mg Oral Daily  . clindamycin (CLEOCIN) IV  900 mg Intravenous Q8H  . docusate sodium  100 mg Oral BID  . gentamicin  270 mg Intravenous Q24H  . heparin  5,000 Units Subcutaneous Q8H  . losartan  100 mg Oral Daily  . metoprolol succinate  50 mg Oral Daily  . pantoprazole  40 mg Oral BID AC  . senna  1 tablet Oral BID  . DISCONTD: ampicillin (OMNIPEN) IV  2 g Intravenous Q6H  . DISCONTD: clindamycin (CLEOCIN) IV  900 mg Intravenous Q8H  . DISCONTD: hydrochlorothiazide  12.5 mg Oral Daily   Continuous Infusions:    . sodium chloride 75 mL/hr at 02/03/11 0005  . DISCONTD: sodium chloride 50 mL/hr at 02/02/11 0154   PRN Meds:.acetaminophen, acetaminophen, morphine, ondansetron (ZOFRAN) IV, ondansetron  Assessment & Plan   1. 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 Imaging recommendation is to repeat in 2-3 wks, and pt has been set up to f/u for UKoreaon 2/15 and with Dr GAlycia Rossettion 03/03/11  2. Hyponatremia: improving onIVF's off HCTZ.  3.  HypoK: resolved 4. HTN: Continue home amlodipine, ASA 81, Losartan, Toprol.   VAdair PatterD on 02/03/2011 at 1:59 PM  [place] [phone]

## 2011-02-04 LAB — BASIC METABOLIC PANEL
GFR calc Af Amer: 75 mL/min — ABNORMAL LOW (ref >90)
GFR calc non Af Amer: 65 mL/min — ABNORMAL LOW (ref >90)
Potassium: 3.4 mEq/L — ABNORMAL LOW (ref 3.5–5.1)
Sodium: 136 mEq/L (ref 135–145)

## 2011-02-04 LAB — GLUCOSE, CAPILLARY: Glucose-Capillary: 158 mg/dL — ABNORMAL HIGH (ref 70–99)

## 2011-02-04 LAB — CBC
Hemoglobin: 10.3 g/dL — ABNORMAL LOW (ref 12.0–15.0)
MCHC: 34.7 g/dL (ref 30.0–36.0)
RDW: 13.5 % (ref 11.5–15.5)

## 2011-02-04 MED ORDER — POTASSIUM CHLORIDE CRYS ER 20 MEQ PO TBCR
40.0000 meq | EXTENDED_RELEASE_TABLET | Freq: Once | ORAL | Status: DC
  Filled 2011-02-04: qty 2

## 2011-02-04 MED ORDER — METRONIDAZOLE 500 MG PO TABS: 500.0000 mg | ORAL_TABLET | Freq: Three times a day (TID) | ORAL | Status: AC

## 2011-02-04 MED ORDER — AMOXICILLIN 500 MG PO CAPS: 500.0000 mg | ORAL_CAPSULE | Freq: Three times a day (TID) | ORAL | Status: AC

## 2011-02-04 MED ORDER — LOSARTAN POTASSIUM 100 MG PO TABS: 100.0000 mg | ORAL_TABLET | Freq: Every day | ORAL | Status: DC

## 2011-02-04 NOTE — Progress Notes (Signed)
Patient has not been nauseous, vomited, no c/o abd pain this shift so far

## 2011-02-04 NOTE — Progress Notes (Signed)
Discharge Note  Name: [name] MRN: [id number] DOB: 1940/04/14 71 y.o.  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      amLODipine 5 MG tablet   Commonly known as: NORVASC   Take 5 mg by mouth daily.      amoxicillin 500 MG capsule   Commonly known as: AMOXIL   Take 1 capsule (500 mg total) by mouth 3 (three) times daily.      aspirin EC 81 MG tablet   Take 81 mg by mouth daily.      cholecalciferol 1000 UNITS tablet   Commonly known as: VITAMIN D   Take 1,000 Units by mouth daily.      Chromium Picolinate 200 MCG Caps   Take 1 capsule by mouth daily.      losartan 100 MG tablet   Commonly known as: COZAAR   Take 1 tablet (100 mg total) by mouth daily.      Magnesium 200 MG Tabs   Take 1 tablet by mouth daily.      metoprolol succinate 50 MG 24 hr tablet   Commonly known as: TOPROL-XL   Take 50 mg by mouth daily. Take with or immediately following a meal.      metroNIDAZOLE 500 MG tablet   Commonly known as: FLAGYL   Take 1 tablet (500 mg total) by mouth 3 (three) times daily.      omega-3 acid ethyl esters 1 G capsule   Commonly known as: LOVAZA   Take 2 g by mouth daily.      pantoprazole 40 MG tablet   Commonly known as: PROTONIX   Take 40 mg by mouth 2 (two) times daily.            Disposition and follow-up:   [name] was discharged from [facility] in improved/stable condition.    Follow-up Appointments: Discharge Orders    Future  Appointments: Provider: Department: Dept Phone: Center:   02/25/2011 11:00 AM [facility] [phone] [place]   02/25/2011 11:30 AM [facility] [phone] [place]   03/03/2011 11:30 AM Paola A. Alycia Rossetti, MD [facility] [phone] None     Future Orders Please Complete By Expires   Diet - low sodium heart healthy      Increase activity slowly      Call MD for:  temperature >100.4         Consultations:    Procedures Performed:  US Transvaginal Non-ob  01/31/2011  *RADIOLOGY REPORT*  Clinical Data: Abnormal appearance of the left ovary on CT scan. Elevated white blood cell count.  Pain.  TRANSABDOMINAL AND TRANSVAGINAL ULTRASOUND OF PELVIS Technique:  Both transabdominal and transvaginal ultrasound examinations of the pelvis were performed. Transabdominal technique was performed for global imaging of the pelvis including uterus, ovaries, adnexal regions, and pelvic cul-de-sac.  Comparison: CT abdomen and pelvis 01/31/2011 at 1949 hours.   It was necessary to proceed with endovaginal exam  following the transabdominal exam to visualize the left ovary.  Findings:  Uterus: Normal in size and appearance  Endometrium: Normal in thickness and appearance  Right ovary:  Normal appearance/no adnexal mass  Left ovary: Measures 5.9 x 4.8 x 3.6 cm.  Multiple hypoechoic foci are identified within the right ovary correlating with low attenuation seen on CT scan. There is increased signal on Doppler imaging in these locations.  Other findings: No free fluid  IMPRESSION: Abnormal appearance of the left ovary is compatible with tubo- ovarian abscess or neoplasm. Given elevated white count, infection is favored. However, repeat ultrasound in 2-3 weeks after appropriate therapy is recommended to exclude neoplasm.  Original Report Authenticated By: Arvid Right. Luther Parody, M.D.   US Pelvis Complete  01/31/2011  *RADIOLOGY REPORT*  Clinical Data: Abnormal appearance of the left ovary on CT scan.  Elevated white blood cell count.  Pain.  TRANSABDOMINAL AND TRANSVAGINAL ULTRASOUND OF PELVIS Technique:  Both transabdominal and transvaginal ultrasound examinations of the pelvis were performed. Transabdominal technique was performed for global imaging of the pelvis including uterus, ovaries, adnexal regions, and pelvic cul-de-sac.  Comparison: CT abdomen and pelvis 01/31/2011 at 1949 hours.   It was necessary to proceed with endovaginal exam following the transabdominal exam to visualize the left ovary.  Findings:  Uterus: Normal in size and appearance  Endometrium: Normal in thickness and appearance  Right ovary:  Normal appearance/no adnexal mass  Left ovary: Measures 5.9 x 4.8 x 3.6 cm.  Multiple hypoechoic foci are identified within the right ovary correlating with low attenuation seen on CT scan. There is increased signal on Doppler imaging in these locations.  Other findings: No free fluid  IMPRESSION: Abnormal appearance of the left ovary is compatible with tubo- ovarian abscess or neoplasm. Given elevated white count, infection is favored. However, repeat ultrasound in 2-3 weeks after appropriate therapy is recommended to exclude neoplasm.  Original Report Authenticated By: Arvid Right. Luther Parody, M.D.   Ct Abdomen Pelvis W Contrast  01/31/2011  *RADIOLOGY REPORT*  Clinical Data: Lower quadrant pain.  Elevated white blood count.  CT ABDOMEN AND PELVIS WITH CONTRAST  Technique:  Multidetector CT imaging of the abdomen and pelvis was performed following the standard protocol during bolus administration of intravenous contrast.  Contrast: 74m OMNIPAQUE IOHEXOL 300 MG/ML IV SOLN  Comparison: 12/03/2008  Findings: The left ovary is markedly abnormal and changed since the prior study of 12/03/2008.  It now measures 5.7 x 4.4 x 3.5 cm and has multiple cystic areas within it as well as slight soft tissue inflammation around the ovary.  This could represent tumor or infection.  It is immediately adjacent to an  area of extensive diverticulosis of the sigmoid portion of the colon but there is no finding of acute diverticulitis.  The liver, spleen, pancreas, gallbladder, adrenal glands, and kidneys are normal.  No dilated bowel.  The right ovary and uterus are normal.  No free fluid in the pelvis.  No acute osseous abnormalities.  IMPRESSION: The patient has developed an abnormal appearance of the left ovary. The ovary has enlarged and there are now  multiple cystic areas within the ovary.  Slight inflammatory changes around the ovary. This could be due to infection or tumor.  Original Report Authenticated By: JLarey Seat M.D.     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  Pt is somewhat  poor historian but relates feeling increasingly sleepy, malaised, and  without energy for the past week, for which she sought care at her PCP's office today, and  was found to have elevated WBC count, and sent to the ED. She also reports some LLQ pain,  described as deep and pelvic pain worse with urinating or having a BM. She reports  subjective fevers at home, and reports a temp of 99 a few days ago.  In the ED, vitals were stable. Labs significant for Na 129, K 3.3, Cl 91, renal  normal 17/0.85. WBC was 16.3 with 82% neutros 10% lymphs, rest of CBC stable. UA  with trace ketones, small LE/neg nitrite, few bacteria, UCx pending. CT  abd/pelvis showed abnormal appearance of left ovary, enlarged and with multiple  cystic areas within the ovary, with slight inflammatory changes around the ovary,  called as infxn vs tumor. Ultrasound called as tubo-ovarian abscess or neoplasm,  recommend repeat u/s in 2-3 wks after appropriate therapy to r/o neoplasm.  Gynecology on call was called, and recommended Ampicillin/Gentamycin/Clindamycin, get a  CA-125, and would need Gyn-Oncology  consultation given a concern that this was malignant.  Pt is ordered for ABx and has also been given 500 cc NS, 40 mEq KCl, zofran.  Of note, she saw Dr. Fuller Plan in 12/2008 as outpt after an admission in 11/2008 for lower  abdominal pain, Dx with acute diverticulitis. CT scanning showed edema, soft tissue  inflammation in her rectosigmoid colon, felt likely due to diverticular disease although  mass couldn't be excluded. He recommended colonoscopy which showed moderate diverticulosis  but no masses.  ROS as above, also with chronic hip and join pains that have worsened recently, making  walking more difficult. Otherwise unremarkable. Of note, she did take a rapid taper of  Prednisone but that stopped last Wednesday (almost a week ago).   Physical exam on discharge In general: Awake Alert, Oriented *3,  HEENT: [place].AT,PERRAL  Neck: Supple Neck,No JVD, No cervical lymphadenopathy appriciated.  Lungs: Symmetrical Chest wall movement, Good air movement bilaterally, CTAB  CARDIAC: RRR,No Gallops,Rubs or new Murmurs, No Parasternal Heave  Abdomen: +ve B.Sounds, Abd Soft, mild LLQ tenderness, No organomegaly appreciated, No rebound -guarding or rigidity.  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 Dellis Filbert recommended a triple antibiotics with gentamicin, clindamycin and amoxicillin.she also recommended a GYN oncology referral and this was done and Dr. Alycia Rossetti set the patient up for a followup ultrasound on 21/15 and follow up with him on 2/21. Patient responded well to the triple antibiotics with improvement in her symptoms arm and also defervesced and her leukocytosis him  gradually improved and and follow up today this resolved.the patient will be transitioned to oral antibiotics -and I discussed this with Dr. Dellis Filbert who recommends discharging her on amoxicillin and Flagyl to complete a two-week course and she is to follow up outpatient as above.  2. Hyponatremia: It was noted that the patient had been on hydrochlorothiazide and this was discontinued and she was hydrated with IV fluids and itresolved. Patient has been instructed to stay off the hydrochlorothiazide, she is to continue lorsartan along with her other antihypertensives and followup with her primary care  physician. Her blood pressures were controlled on the regimen without HCTZ 3. HypoK: her potassium was replaced in the hospital. 4. HTN: As above, she was maintained on Norvasc, metoprolol and lorsatan which she is to continue upon discharge. Hydrochlorothiazide DC'd as discussed above.       Discharge Vitals:  BP 132/74  Pulse 80  Temp(Src) 99.3 F (37.4 C) (Oral)  Resp 16  Ht 5' 0.25" (1.53 m)  Wt 63.617 kg (140 lb 4 oz)  BMI 27.16 kg/m2  SpO2 95%  Discharge Labs:  Results for orders placed during the hospital encounter of 01/31/11 (from the past 24 hour(s))  CBC     Status: Abnormal   Collection Time   02/04/11  4:00 AM      Component Value Range   WBC 9.7  4.0 - 10.5 (K/uL)   RBC 3.35 (*) 3.87 - 5.11 (MIL/uL)   Hemoglobin 10.3 (*) 12.0 - 15.0 (g/dL)   HCT 29.7 (*) 36.0 - 46.0 (%)   MCV 88.7  78.0 - 100.0 (fL)   MCH 30.7  26.0 - 34.0 (pg)   MCHC 34.7  30.0 - 36.0 (g/dL)   RDW 13.5  11.5 - 15.5 (%)   Platelets 302  150 - 400 (K/uL)  BASIC METABOLIC PANEL     Status: Abnormal   Collection Time   02/04/11  4:00 AM      Component Value Range   Sodium 136  135 - 145 (mEq/L)   Potassium 3.4 (*) 3.5 - 5.1 (mEq/L)   Chloride 103  96 - 112 (mEq/L)   CO2 24  19 - 32 (mEq/L)   Glucose, Bld 109 (*) 70 - 99 (mg/dL)   BUN 6  6 - 23 (mg/dL)   Creatinine, Ser 0.87  0.50 - 1.10 (mg/dL)    Calcium 8.5  8.4 - 10.5 (mg/dL)   GFR calc non Af Amer 65 (*) >90 (mL/min)   GFR calc Af Amer 75 (*) >90 (mL/min)  GLUCOSE, CAPILLARY     Status: Abnormal   Collection Time   02/04/11  6:48 AM      Component Value Range   Glucose-Capillary 158 (*) 70 - 99 (mg/dL)   Comment 1 Documented in Chart     Comment 2 Notify RN      SignedSheila Oats 02/04/2011, 1:07 PM

## 2011-02-07 LAB — CULTURE, BLOOD (SINGLE): Culture  Setup Time: 201301220900

## 2011-02-25 ENCOUNTER — Encounter (HOSPITAL_COMMUNITY)
Admit: 2011-02-25 | Discharge: 2011-02-25 | Disposition: A | Discharge status: Home / Self Care | Payer: Medicare Other | Attending: Gynecologic Oncology | Admitting: Gynecologic Oncology

## 2011-02-25 ENCOUNTER — Other Ambulatory Visit: Payer: Self-pay | Admitting: Gynecologic Oncology

## 2011-02-25 DIAGNOSIS — N83209 Unspecified ovarian cyst, unspecified side: Secondary | ICD-10-CM

## 2011-02-28 ENCOUNTER — Telehealth: Payer: Self-pay | Admitting: Gynecologic Oncology

## 2011-02-28 NOTE — Telephone Encounter (Signed)
Pt notified of ultrasound results: resolution of cystic area.  Pt wanting to keep appointment on Thursday of this week to have several questions answered by Dr Alycia Rossetti.

## 2011-02-28 NOTE — Discharge Summary (Signed)
Discharge Note  Name: [name]  MRN: [id number]  DOB: 02-Sep-1940 71 y.o.  Date of Admission: 01/31/2011 4:36 PM  Date of Discharge: 02/04/2011  Attending Physician: Sheila Oats, MD  Discharge Diagnosis:  Principal Problem:  *Ovarian cystic mass -Tubo-ovarian abscess versus neoplasm  Active Problems:  Hypertension  Hyponatremia  Hypokalemia  Discharge Medications:  Medication List  As of 02/04/2011 1:08 PM    STOP taking these medications          losartan-hydrochlorothiazide 100-12.5 MG per tablet       TAKE these medications          acidophilus Caps      Take 1 capsule by mouth daily.      amLODipine 5 MG tablet      Commonly known as: NORVASC      Take 5 mg by mouth daily.      amoxicillin 500 MG capsule      Commonly known as: AMOXIL      Take 1 capsule (500 mg total) by mouth 3 (three) times daily.      aspirin EC 81 MG tablet      Take 81 mg by mouth daily.      cholecalciferol 1000 UNITS tablet      Commonly known as: VITAMIN D      Take 1,000 Units by mouth daily.      Chromium Picolinate 200 MCG Caps      Take 1 capsule by mouth daily.      losartan 100 MG tablet      Commonly known as: COZAAR      Take 1 tablet (100 mg total) by mouth daily.      Magnesium 200 MG Tabs      Take 1 tablet by mouth daily.      metoprolol succinate 50 MG 24 hr tablet      Commonly known as: TOPROL-XL      Take 50 mg by mouth daily. Take with or immediately following a meal.      metroNIDAZOLE 500 MG tablet      Commonly known as: FLAGYL      Take 1 tablet (500 mg total) by mouth 3 (three) times daily.      omega-3 acid ethyl esters 1 G capsule      Commonly known as: LOVAZA      Take 2 g by mouth daily.      pantoprazole 40 MG tablet      Commonly known as: PROTONIX      Take 40 mg by mouth 2 (two) times daily.         Disposition and follow-up:  Ms.Tressie S Pettibone was discharged from [facility] in improved/stable condition.  Follow-up Appointments:   Discharge Orders    Future Appointments:  Provider:  Department:  Dept Phone:  Center:    02/25/2011 11:00 AM  [facility]  [phone]  [place]    02/25/2011 11:30 AM  [facility]  [phone]  [place]    03/03/2011 11:30 AM  Paola A. Alycia Rossetti, [facility]  [phone]  None      Future Orders  Please Complete By  Expires    Diet - low sodium heart healthy      Increase activity slowly      Call MD for: temperature >100.4        Consultations:  Procedures Performed:  US Transvaginal Non-ob  01/31/2011 *  RADIOLOGY REPORT* Clinical Data: Abnormal appearance of the left ovary on CT scan. Elevated white blood cell count. Pain. TRANSABDOMINAL AND TRANSVAGINAL ULTRASOUND OF PELVIS Technique: Both transabdominal and transvaginal ultrasound examinations of the pelvis were performed. Transabdominal technique was performed for global imaging of the pelvis including uterus, ovaries, adnexal regions, and pelvic cul-de-sac. Comparison: CT abdomen and pelvis 01/31/2011 at 1949 hours. It was necessary to proceed with endovaginal exam following the transabdominal exam to visualize the left ovary. Findings: Uterus: Normal in size and appearance Endometrium: Normal in thickness and appearance Right ovary: Normal appearance/no adnexal mass Left ovary: Measures 5.9 x 4.8 x 3.6 cm. Multiple hypoechoic foci are identified within the right ovary correlating with low attenuation seen on CT scan. There is increased signal on Doppler imaging in these locations. Other findings: No free fluid IMPRESSION: Abnormal appearance of the left ovary is compatible with tubo- ovarian abscess or neoplasm. Given elevated white count, infection is favored. However, repeat ultrasound in 2-3 weeks after appropriate therapy is recommended to exclude neoplasm. Original Report Authenticated By: Arvid Right. Luther Parody, M.D.  US Pelvis Complete  01/31/2011 *RADIOLOGY REPORT* Clinical Data: Abnormal appearance of the left  ovary on CT scan. Elevated white blood cell count. Pain. TRANSABDOMINAL AND TRANSVAGINAL ULTRASOUND OF PELVIS Technique: Both transabdominal and transvaginal ultrasound examinations of the pelvis were performed. Transabdominal technique was performed for global imaging of the pelvis including uterus, ovaries, adnexal regions, and pelvic cul-de-sac. Comparison: CT abdomen and pelvis 01/31/2011 at 1949 hours. It was necessary to proceed with endovaginal exam following the transabdominal exam to visualize the left ovary. Findings: Uterus: Normal in size and appearance Endometrium: Normal in thickness and appearance Right ovary: Normal appearance/no adnexal mass Left ovary: Measures 5.9 x 4.8 x 3.6 cm. Multiple hypoechoic foci are identified within the right ovary correlating with low attenuation seen on CT scan. There is increased signal on Doppler imaging in these locations. Other findings: No free fluid IMPRESSION: Abnormal appearance of the left ovary is compatible with tubo- ovarian abscess or neoplasm. Given elevated white count, infection is favored. However, repeat ultrasound in 2-3 weeks after appropriate therapy is recommended to exclude neoplasm. Original Report Authenticated By: Arvid Right. Luther Parody, M.D.  Ct Abdomen Pelvis W Contrast  01/31/2011 *RADIOLOGY REPORT* Clinical Data: Lower quadrant pain. Elevated white blood count. CT ABDOMEN AND PELVIS WITH CONTRAST Technique: Multidetector CT imaging of the abdomen and pelvis was performed following the standard protocol during bolus administration of intravenous contrast. Contrast: 67m OMNIPAQUE IOHEXOL 300 MG/ML IV SOLN Comparison: 12/03/2008 Findings: The left ovary is markedly abnormal and changed since the prior study of 12/03/2008. It now measures 5.7 x 4.4 x 3.5 cm and has multiple cystic areas within it as well as slight soft tissue inflammation around the ovary. This could represent tumor or infection. It is immediately adjacent to an area of  extensive diverticulosis of the sigmoid portion of the colon but there is no finding of acute diverticulitis. The liver, spleen, pancreas, gallbladder, adrenal glands, and kidneys are normal. No dilated bowel. The right ovary and uterus are normal. No free fluid in the pelvis. No acute osseous abnormalities. IMPRESSION: The patient has developed an abnormal appearance of the left ovary. The ovary has enlarged and there are now multiple cystic areas within the ovary. Slight inflammatory changes around the ovary. This could be due to infection or tumor. Original Report Authenticated By: JLarey Seat M.D.  Admission HPI  89yoFwith h/o HTN, questionable celiac disease, diverticulitis presents with LLQ  pain and  found to have left ovarian cystic lesion concerning for TOA vs neoplasm.  Pt is somewhat poor historian but relates feeling increasingly sleepy, malaised, and  without energy for the past week, for which she sought care at her PCP's office today, and  was found to have elevated WBC count, and sent to the ED. She also reports some LLQ pain,  described as deep and pelvic pain worse with urinating or having a BM. She reports  subjective fevers at home, and reports a temp of 99 a few days ago.  In the ED, vitals were stable. Labs significant for Na 129, K 3.3, Cl 91, renal  normal 17/0.85. WBC was 16.3 with 82% neutros 10% lymphs, rest of CBC stable. UA  with trace ketones, small LE/neg nitrite, few bacteria, UCx pending. CT  abd/pelvis showed abnormal appearance of left ovary, enlarged and with multiple  cystic areas within the ovary, with slight inflammatory changes around the ovary,  called as infxn vs tumor. Ultrasound called as tubo-ovarian abscess or neoplasm,  recommend repeat u/s in 2-3 wks after appropriate therapy to r/o neoplasm.  Gynecology on call was called, and recommended Ampicillin/Gentamycin/Clindamycin, get a  CA-125, and would need Gyn-Oncology consultation given a concern  that this was malignant.  Pt is ordered for ABx and has also been given 500 cc NS, 40 mEq KCl, zofran.  Of note, she saw Dr. Fuller Plan in 12/2008 as outpt after an admission in 11/2008 for lower  abdominal pain, Dx with acute diverticulitis. CT scanning showed edema, soft tissue  inflammation in her rectosigmoid colon, felt likely due to diverticular disease although  mass couldn't be excluded. He recommended colonoscopy which showed moderate diverticulosis  but no masses.  ROS as above, also with chronic hip and join pains that have worsened recently, making  walking more difficult. Otherwise unremarkable. Of note, she did take a rapid taper of  Prednisone but that stopped last Wednesday (almost a week ago).  Physical exam on discharge  In general: Awake Alert, Oriented *3,  HEENT: [place].AT,PERRAL  Neck: Supple Neck,No JVD, No cervical lymphadenopathy appriciated.  Lungs: Symmetrical Chest wall movement, Good air movement bilaterally, CTAB  CARDIAC: RRR,No Gallops,Rubs or new Murmurs, No Parasternal Heave  Abdomen: +ve B.Sounds, Abd Soft, mild LLQ tenderness, No organomegaly appreciated, No rebound -guarding or rigidity.  Extremities: No Cyanosis, Clubbing or edema  Hospital Course by problem list:  Principal Problem:  *Ovarian cystic mass -Tubo-ovarian abscess versus neoplasm  Active Problems:  Hypertension  Hyponatremia  Hypokalemia  1. 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 Dellis Filbert recommended a triple antibiotics with gentamicin, clindamycin and amoxicillin.she also recommended a GYN oncology referral and this was done and Dr. Alycia Rossetti set the patient up for a followup ultrasound on 21/15 and follow up with him on 2/21. Patient responded well to the triple antibiotics with improvement in her symptoms arm and also defervesced and her leukocytosis him gradually improved and and follow  up today this resolved.the patient will be transitioned to oral antibiotics -and I discussed this with Dr. Dellis Filbert who recommends discharging her on amoxicillin and Flagyl to complete a two-week course and she is to follow up outpatient as above.  2. Hyponatremia: It was noted that the patient had been on hydrochlorothiazide and this was discontinued and she was hydrated with IV fluids and itresolved. Patient has been instructed  to stay off the hydrochlorothiazide, she is to continue lorsartan along with her other antihypertensives and followup with her primary care physician. Her blood pressures were controlled on the regimen without HCTZ  3. HypoK: her potassium was replaced in the hospital.  4. HTN: As above, she was maintained on Norvasc, metoprolol and lorsatan which she is to continue upon discharge. Hydrochlorothiazide DC'd as discussed above.    Discharge Vitals: BP 132/74  Pulse 80  Temp(Src) 99.3 F (37.4 C) (Oral)  Resp 16  Ht 5' 0.25" (1.53 m)  Wt 63.617 kg (140 lb 4 oz)  BMI 27.16 kg/m2  SpO2 95%  Discharge Labs:  Results for orders placed during the hospital encounter of 01/31/11 (from the past 24 hour(s))   CBC Status: Abnormal    Collection Time    02/04/11 4:00 AM   Component  Value  Range    WBC  9.7  4.0 - 10.5 (K/uL)    RBC  3.35 (*)  3.87 - 5.11 (MIL/uL)    Hemoglobin  10.3 (*)  12.0 - 15.0 (g/dL)    HCT  29.7 (*)  36.0 - 46.0 (%)    MCV  88.7  78.0 - 100.0 (fL)    MCH  30.7  26.0 - 34.0 (pg)    MCHC  34.7  30.0 - 36.0 (g/dL)    RDW  13.5  11.5 - 15.5 (%)    Platelets  302  150 - 400 (K/uL)   BASIC METABOLIC PANEL Status: Abnormal    Collection Time    02/04/11 4:00 AM   Component  Value  Range    Sodium  136  135 - 145 (mEq/L)    Potassium  3.4 (*)  3.5 - 5.1 (mEq/L)    Chloride  103  96 - 112 (mEq/L)    CO2  24  19 - 32 (mEq/L)    Glucose, Bld  109 (*)  70 - 99 (mg/dL)    BUN  6  6 - 23 (mg/dL)    Creatinine, Ser  0.87  0.50 - 1.10 (mg/dL)    Calcium  8.5   8.4 - 10.5 (mg/dL)    GFR calc non Af Amer  65 (*)  >90 (mL/min)    GFR calc Af Amer  75 (*)  >90 (mL/min)   GLUCOSE, CAPILLARY Status: Abnormal    Collection Time    02/04/11 6:48 AM   Component  Value  Range    Glucose-Capillary  158 (*)  70 - 99 (mg/dL)    Comment 1  Documented in Chart     Comment 2  Notify RN     SignedSheila Oats  02/04/2011, 1:07 PM   Please note d/c summary was completed on 1/25, wrongly labelled progress note.

## 2011-03-03 ENCOUNTER — Ambulatory Visit: Payer: Medicare Other | Attending: Gynecologic Oncology | Admitting: Gynecologic Oncology

## 2011-03-03 ENCOUNTER — Encounter: Payer: Self-pay | Admitting: Gynecologic Oncology

## 2011-03-03 VITALS — BP 148/72 | HR 80 | Temp 97.8°F | Resp 20 | Ht 60.0 in | Wt 136.1 lb

## 2011-03-03 DIAGNOSIS — N83209 Unspecified ovarian cyst, unspecified side: Secondary | ICD-10-CM

## 2011-03-03 DIAGNOSIS — Z881 Allergy status to other antibiotic agents status: Secondary | ICD-10-CM | POA: Insufficient documentation

## 2011-03-03 DIAGNOSIS — D72829 Elevated white blood cell count, unspecified: Secondary | ICD-10-CM | POA: Insufficient documentation

## 2011-03-03 DIAGNOSIS — K219 Gastro-esophageal reflux disease without esophagitis: Secondary | ICD-10-CM | POA: Insufficient documentation

## 2011-03-03 DIAGNOSIS — Z886 Allergy status to analgesic agent status: Secondary | ICD-10-CM | POA: Insufficient documentation

## 2011-03-03 DIAGNOSIS — Z9089 Acquired absence of other organs: Secondary | ICD-10-CM | POA: Insufficient documentation

## 2011-03-03 DIAGNOSIS — Z87891 Personal history of nicotine dependence: Secondary | ICD-10-CM | POA: Insufficient documentation

## 2011-03-03 DIAGNOSIS — I10 Essential (primary) hypertension: Secondary | ICD-10-CM | POA: Insufficient documentation

## 2011-03-03 DIAGNOSIS — K900 Celiac disease: Secondary | ICD-10-CM | POA: Insufficient documentation

## 2011-03-03 DIAGNOSIS — M1990 Unspecified osteoarthritis, unspecified site: Secondary | ICD-10-CM | POA: Insufficient documentation

## 2011-03-03 DIAGNOSIS — Z888 Allergy status to other drugs, medicaments and biological substances status: Secondary | ICD-10-CM | POA: Insufficient documentation

## 2011-03-03 DIAGNOSIS — K599 Functional intestinal disorder, unspecified: Secondary | ICD-10-CM | POA: Insufficient documentation

## 2011-03-03 DIAGNOSIS — R109 Unspecified abdominal pain: Secondary | ICD-10-CM | POA: Insufficient documentation

## 2011-03-03 DIAGNOSIS — K5730 Diverticulosis of large intestine without perforation or abscess without bleeding: Secondary | ICD-10-CM | POA: Insufficient documentation

## 2011-03-03 NOTE — Progress Notes (Signed)
Consult Note: Gyn-Onc  [name] 71 y.o. female  CC:  Chief Complaint  Patient presents with  . Ovarian Cyst    Follow up    HPI:  [name] is a very pleasant 71 year old gravida 2 para 2 who gives a one-month history of not feeling particularly well. She states this sometimes happens in his dietary related due to her celiac disease. She presented with a one-week history of left lower quadrant pain. This coincides with her not having a bowel movement for approximately one week's time as well. She presented to the emergency room at which time her labs are significant for sodium of 129, potassium 3.3, chloride of 91. In addition, her WBCs were 16.3 with a left shift. CT scan of the abdomen and pelvis revealed an abnormal appearance of the left ovary, it was enlarged with multiple cystic areas within the ovary. There was slight inflammatory changes around the ovary. Radiology could not rule out infection such as a tubo-ovarian abscess versus neoplasm. Ultrasound was confirmatory. CA 125 was 17.8. She was seen by Dr. Addison Velazquez the following morning. Exam at that time revealed the uterus and cervix very tender to mobilization. The right adnexa was normal. The left adnexa revealed a soft mass which is mildly tender.  The patient states that she is feeling much better than she was when she was admitted.  It was felt that she may have left ovarian tubo-ovarian abscess versus neoplastic process. Her white blood count is 16 she did have a left shift. Since being started on triple antibiotics she's felt much better.On imaging she does appear to have a 5.9 x 4.8 x 3.6 cm left ovarian mass. He has multiple cystic areas within it as well as slight soft tissue inflammation around the ovary. There is no comment regarding lymphadenopathy, ascites, or carcinomatosis. She had a followup ultrasound on February 15. These results were communicated with the patient and she wanted to come in to have a further discussion. She  had both a transabdominal and transvaginal ultrasound of the pelvis on February 15 it was compared to a prior study on January 21. The uterus measured 6.4 x 2.7 x 4 cm with no myometrial abnormalities.  The endometrium was 7 mm a trace amount of endometrial fluid. The right ovary measured 2 x 0.9 x 1 cm with no cysts or masses. The left ovary measured 3 x 2.3 x 2.2 cm with no cysts or masses. There is no free fluid. It was felt that the large left adnexal cystic area had resolved. It was felt he could BE due to a ruptured Embry absorbed cyst or a result hydrosalpinx. She had a stable borderline endometrium. She's comes in to discuss these results.  Interval History:   She has no complaints. She does complain of some occasional of back pain which radiates. She's been seeing a chiropractor Dr. Jimmye Velazquez for this. She wanted to make sure that the chiropractic treatments did not contribute to this left ovarian cyst. She is very pleased that the cyst is gone. Her husband is having a cardiac catheterization tomorrow and she states this is "one less thing to worry about". Review of Systems  Current Meds:  Outpatient Encounter Prescriptions as of 03/03/2011  Medication Sig Dispense Refill  . acidophilus (RISAQUAD) CAPS Take 1 capsule by mouth daily.      Marland Kitchen amLODipine (NORVASC) 5 MG tablet Take 5 mg by mouth daily.      Marland Kitchen aspirin EC 81 MG tablet Take 81 mg  by mouth daily.      . cholecalciferol (VITAMIN D) 1000 UNITS tablet Take 1,000 Units by mouth daily.      . Chromium Picolinate 200 MCG CAPS Take 1 capsule by mouth daily.      Marland Kitchen losartan (COZAAR) 100 MG tablet Take 1 tablet (100 mg total) by mouth daily.  30 tablet  0  . Magnesium 200 MG TABS Take 1 tablet by mouth daily.      . metoprolol succinate (TOPROL-XL) 50 MG 24 hr tablet Take 50 mg by mouth daily. Take with or immediately following a meal.      . omega-3 acid ethyl esters (LOVAZA) 1 G capsule Take 2 g by mouth daily.      . pantoprazole  (PROTONIX) 40 MG tablet Take 40 mg by mouth 2 (two) times daily.        Allergy:  Allergies  Allergen Reactions  . Gluten Meal     Diarrhea, vomiting that lasts 3-4 hours  . Ace Inhibitors     Coughing  . Ciprofloxacin     Joint pain  . Ibuprofen     Joint pain  . Levofloxacin     Social Hx:   History   Social History  . Marital Status: Married    Spouse Name: N/A    Number of Children: N/A  . Years of Education: N/A   Occupational History  . Not on file.   Social History Main Topics  . Smoking status: Former Smoker    Types: Cigarettes    Quit date: 03/18/1967  . Smokeless tobacco: Never Used  . Alcohol Use: No  . Drug Use: No  . Sexually Active: Not on file   Other Topics Concern  . Not on file   Social History Narrative   Lives at home, with husband. Has a son and daughter. Still ambulatory, still active. [name] daughter [phone]    Past Surgical Hx:  Past Surgical History  Procedure Date  . Appendectomy   . Carpal tunnel release   . Tonsillectomy   . Knee arthroscopy     Past Medical Hx:  Past Medical History  Diagnosis Date  . Hypertension   . Celiac disease     per Dr. Fuller Velazquez, is questionable.   . Diverticulitis     and diverticulosis in sigmoid colon   . Heart murmur   . IBS (irritable bowel syndrome)   . GERD (gastroesophageal reflux disease)   . Erosive esophagitis     with hiatal hernia, Barrett's esophagus 02/2004  . Degenerative joint disease     Family Hx:  Family History  Problem Relation Age of Onset  . Breast cancer Sister   . Heart disease Father   . Stroke Mother     multiple    Vitals:  Blood pressure 148/72, pulse 80, temperature 97.8 F (36.6 C), temperature source Oral, resp. rate 20, height 5' (1.524 m), weight 136 lb 1.6 oz (61.735 kg).  Physical Exam:   Assessment/Velazquez: 71 year old with a cystic mass in the left adnexa it was felt that could be consistent with an inflammatory versus infectious process.  Her white count was elevated at the time of diagnosis and she had abdominal pain as well as a left shift. CT scan did show some slight inflammatory changes around the ovary. Radiographically could not rule out a tubo-ovarian abscess. Her CA 125 was normal. After treatment with antibiotics a repeat ultrasound was performed on February 15 that revealed complete resolution.  At this point I do not believe that there is any further evaluation or intervention that needs to ensue. She will be released from our clinic. She knows that I will be happy to see her in the future should the need arise  Jobie Popp A., MD 03/03/2011, 1:20 PM

## 2011-03-03 NOTE — Patient Instructions (Signed)
Followup with her other physicians as scheduled

## 2011-03-05 ENCOUNTER — Encounter (HOSPITAL_COMMUNITY): Payer: Self-pay

## 2011-03-05 ENCOUNTER — Emergency Department (INDEPENDENT_AMBULATORY_CARE_PROVIDER_SITE_OTHER): Payer: Medicare Other

## 2011-03-05 ENCOUNTER — Emergency Department (INDEPENDENT_AMBULATORY_CARE_PROVIDER_SITE_OTHER)
Admission: EM | Admit: 2011-03-05 | Discharge: 2011-03-05 | Disposition: A | Discharge status: Home / Self Care | Payer: Medicare Other | Source: Home / Self Care | Attending: Emergency Medicine | Admitting: Emergency Medicine

## 2011-03-05 DIAGNOSIS — S60219A Contusion of unspecified wrist, initial encounter: Secondary | ICD-10-CM

## 2011-03-05 DIAGNOSIS — S60212A Contusion of left wrist, initial encounter: Secondary | ICD-10-CM

## 2011-03-05 NOTE — ED Provider Notes (Signed)
Chief Complaint  Patient presents with  . Wrist Pain    History of Present Illness:   The patient injured her left wrist 3 days ago. She was lifting a heavy object and fell on her wrist, causing a crush type injury to the wrist. Ever since then the wrist is been somewhat swollen, and tender, but she has a full range of motion without much pain. She denies any numbness, tingling, or weakness in the hand.  Review of Systems:  Other than noted above, the patient denies any of the following symptoms: Systemic:  No fevers, chills, sweats, or aches.  No fatigue or tiredness. Musculoskeletal:  No joint pain, arthritis, bursitis, swelling, back pain, or neck pain. Neurological:  No muscular weakness, paresthesias, headache, or trouble with speech or coordination.  No dizziness.   [place]:  Past medical history, family history, social history, meds, and allergies were reviewed.  Physical Exam:   Vital signs:  BP 167/80  Pulse 82  Temp(Src) 98.3 F (36.8 C) (Tympanic)  Resp 18 Gen:  Alert and oriented times 3.  In no distress. Musculoskeletal: Exam of the wrist reveals no swelling, bruising, or deformity. There is mild pain to palpation over the distal radius and ulna, but none over the wrist itself and none over the anatomical snuff box area. The wrist have full range of motion both actively and passively without any pain at all Otherwise, all joints had a full a ROM with no swelling, bruising or deformity.  No edema, pulses full. Extremities were warm and pink.  Capillary refill was brisk.  Skin:  Clear, warm and dry.  No rash. Neuro:  Alert and oriented times 3.  Muscle strength was normal.  Sensation was intact to light touch.   Radiology:  Dg Wrist Complete Left  03/05/2011  *RADIOLOGY REPORT*  Clinical Data: Crush injury to wrist  LEFT WRIST - COMPLETE 3+ VIEW  Comparison: None.  Findings: No acute fracture and no dislocation.  Unremarkable soft tissues.  Degenerative changes are noted at the  base of the first metacarpal.  IMPRESSION: No acute bony pathology.  Chronic change.  Original Report Authenticated By: Jamas Lav, M.D.    Annamary Rummage:   Diagnoses that have been ruled out:  None  Diagnoses that are still under consideration:  None  Final diagnoses:  Contusion of left wrist    Plan:   1.  The following meds were prescribed:   New Prescriptions   No medications on file   2.  The patient was instructed in symptomatic care, including rest and activity, elevation, application of ice and compression.  Appropriate handouts were given. 3.  The patient was told to return if becoming worse in any way, if no better in 3 or 4 days, and given some red flag symptoms that would indicate earlier return.   4.  The patient was told to follow up with her primary care physician if no better in 2 weeks. She was given a wrist splint.   Birdena Crandall, MD 03/05/11 [phone]

## 2011-03-05 NOTE — ED Notes (Signed)
PT GOT LT ARM CAUGHT IN METAL RACK ON Thursday AND CONTINUES TO HAVE PAIN AND SORENESS.

## 2011-03-05 NOTE — Discharge Instructions (Signed)
Bone Bruise  A bone bruise is a small hidden fracture of the bone. It typically occurs with bones located close to the surface of the skin.  SYMPTOMS  The pain lasts longer than a normal bruise.   The bruised area is difficult to use.   There may be discoloration or swelling of the bruised area.   When a bone bruise is found with injury to the anterior cruciate ligament (in the knee) there is often an increased:   Amount of fluid in the knee   Time the fluid in the knee lasts.   Number of days until you are walking normally and regaining the motion you had before the injury.   Number of days with pain from the injury.  DIAGNOSIS  It can only be seen on X-rays known as MRIs. This stands for magnetic resonance imaging. A regular X-ray taken of a bone bruise would appear to be normal. A bone bruise is a common injury in the knee and the heel bone (calcaneus). The problems are similar to those produced by stress fractures, which are bone injuries caused by overuse. A bone bruise may also be a sign of other injuries. For example, bone bruises are commonly found where an anterior cruciate ligament (ACL) in the knee has been pulled away from the bone (ruptured). A ligament is a tough fibrous material that connects bones together to make our joints stable. Bruises of the bone last a lot longer than bruises of the muscle or tissues beneath the skin. Bone bruises can last from days to months and are often more severe and painful than other bruises. TREATMENT Because bone bruises are sudden injuries you cannot often prevent them, other than by being extremely careful. Some things you can do to improve the condition are:  Apply ice to the sore area for 15 to 20 minutes, 3 to 4 times per day while awake for the first 2 days. Put the ice in a plastic bag, and place a towel between the bag of ice and your skin.   Keep your bruised area raised (elevated) when possible to lessen swelling.   For activity:     Use crutches when necessary; do not put weight on the injured leg until you are no longer tender.   You may walk on your affected part as the pain allows, or as instructed.   Start weight bearing gradually on the bruised part.   Continue to use crutches or a cane until you can stand without causing pain, or as instructed.   If a plaster splint was applied, wear the splint until you are seen for a follow-up examination. Rest it on nothing harder than a pillow the first 24 hours. Do not put weight on it. Do not get it wet. You may take it off to take a shower or bath.   If an air splint was applied, more air may be blown into or out of the splint as needed for comfort. You may take it off at night and to take a shower or bath.   Wiggle your toes in the splint several times per day if you are able.   You may have been given an elastic bandage to use with the plaster splint or alone. The splint is too tight if you have numbness, tingling or if your foot becomes cold and blue. Adjust the bandage to make it comfortable.   Only take over-the-counter or prescription medicines for pain, discomfort, or fever as directed by  your caregiver.   Follow all instructions for follow up with your caregiver. This includes any orthopedic referrals, physical therapy, and rehabilitation. Any delay in obtaining necessary care could result in a delay or failure of the bones to heal.  SEEK MEDICAL CARE IF:   You have an increase in bruising, swelling, or pain.   You notice coldness of your toes.   You do not get pain relief with medications.  SEEK IMMEDIATE MEDICAL CARE IF:   Your toes are numb or blue.   You have severe pain not controlled with medications.   If any of the problems that caused you to seek care are becoming worse.  Document Released: 03/19/2003 Document Revised: 09/08/2010 Document Reviewed: 08/01/2007 [facility] Patient Information 2012 [place].

## 2011-03-09 ENCOUNTER — Other Ambulatory Visit: Payer: Self-pay | Admitting: Oncology

## 2011-05-24 ENCOUNTER — Other Ambulatory Visit: Payer: Self-pay | Admitting: Family Medicine

## 2011-05-24 DIAGNOSIS — M25512 Pain in left shoulder: Secondary | ICD-10-CM

## 2011-05-31 ENCOUNTER — Ambulatory Visit
Admission: RE | Admit: 2011-05-31 | Discharge: 2011-05-31 | Disposition: A | Discharge status: Home / Self Care | Payer: Medicare Other | Source: Ambulatory Visit | Attending: Family Medicine | Admitting: Family Medicine

## 2011-05-31 DIAGNOSIS — M25512 Pain in left shoulder: Secondary | ICD-10-CM

## 2011-06-13 ENCOUNTER — Ambulatory Visit: Payer: Medicare Other | Attending: Family Medicine | Admitting: Physical Therapy

## 2011-06-13 DIAGNOSIS — IMO0001 Reserved for inherently not codable concepts without codable children: Secondary | ICD-10-CM | POA: Insufficient documentation

## 2011-06-13 DIAGNOSIS — R293 Abnormal posture: Secondary | ICD-10-CM | POA: Insufficient documentation

## 2011-06-13 DIAGNOSIS — M25619 Stiffness of unspecified shoulder, not elsewhere classified: Secondary | ICD-10-CM | POA: Insufficient documentation

## 2011-06-13 DIAGNOSIS — M25519 Pain in unspecified shoulder: Secondary | ICD-10-CM | POA: Insufficient documentation

## 2011-06-15 ENCOUNTER — Ambulatory Visit: Payer: Medicare Other | Admitting: Physical Therapy

## 2011-06-21 ENCOUNTER — Ambulatory Visit: Payer: Medicare Other | Admitting: Physical Therapy

## 2011-06-23 ENCOUNTER — Ambulatory Visit: Payer: Medicare Other | Admitting: Physical Therapy

## 2011-06-28 ENCOUNTER — Ambulatory Visit: Payer: Medicare Other | Admitting: Physical Therapy

## 2011-06-28 ENCOUNTER — Encounter: Payer: Medicare Other | Admitting: Physical Therapy

## 2011-06-30 ENCOUNTER — Ambulatory Visit: Payer: Medicare Other | Admitting: Physical Therapy

## 2011-07-12 ENCOUNTER — Ambulatory Visit: Payer: Medicare Other | Attending: Family Medicine | Admitting: Physical Therapy

## 2011-07-12 DIAGNOSIS — M25619 Stiffness of unspecified shoulder, not elsewhere classified: Secondary | ICD-10-CM | POA: Insufficient documentation

## 2011-07-12 DIAGNOSIS — IMO0001 Reserved for inherently not codable concepts without codable children: Secondary | ICD-10-CM | POA: Insufficient documentation

## 2011-07-12 DIAGNOSIS — R293 Abnormal posture: Secondary | ICD-10-CM | POA: Insufficient documentation

## 2011-07-12 DIAGNOSIS — M25519 Pain in unspecified shoulder: Secondary | ICD-10-CM | POA: Insufficient documentation

## 2011-07-15 ENCOUNTER — Ambulatory Visit: Payer: Medicare Other | Admitting: Physical Therapy

## 2011-07-19 ENCOUNTER — Encounter: Payer: Medicare Other | Admitting: Physical Therapy

## 2011-07-20 ENCOUNTER — Ambulatory Visit: Payer: Medicare Other | Admitting: Rehabilitative and Restorative Service Providers"

## 2011-07-21 ENCOUNTER — Encounter: Payer: Medicare Other | Admitting: Physical Therapy

## 2011-07-26 ENCOUNTER — Ambulatory Visit: Payer: Medicare Other | Admitting: Physical Therapy

## 2011-07-26 ENCOUNTER — Encounter: Payer: Medicare Other | Admitting: Physical Therapy

## 2011-07-28 ENCOUNTER — Encounter: Payer: Medicare Other | Admitting: Physical Therapy

## 2011-11-07 ENCOUNTER — Other Ambulatory Visit: Payer: Self-pay | Admitting: Family Medicine

## 2011-11-07 DIAGNOSIS — Z1231 Encounter for screening mammogram for malignant neoplasm of breast: Secondary | ICD-10-CM

## 2011-12-13 ENCOUNTER — Encounter: Payer: Self-pay | Admitting: Gastroenterology

## 2011-12-14 ENCOUNTER — Ambulatory Visit
Admission: RE | Admit: 2011-12-14 | Discharge: 2011-12-14 | Disposition: A | Discharge status: Home / Self Care | Payer: Medicare Other | Source: Ambulatory Visit | Attending: Family Medicine | Admitting: Family Medicine

## 2011-12-14 DIAGNOSIS — Z1231 Encounter for screening mammogram for malignant neoplasm of breast: Secondary | ICD-10-CM

## 2012-09-03 ENCOUNTER — Encounter: Payer: Self-pay | Admitting: Gastroenterology

## 2012-09-07 ENCOUNTER — Encounter: Payer: Self-pay | Admitting: Gastroenterology

## 2012-09-25 ENCOUNTER — Encounter: Payer: Self-pay | Admitting: Gastroenterology

## 2012-09-25 ENCOUNTER — Ambulatory Visit (AMBULATORY_SURGERY_CENTER): Payer: Self-pay | Admitting: *Deleted

## 2012-09-25 VITALS — Ht 61.0 in | Wt 147.2 lb

## 2012-09-25 DIAGNOSIS — K2270 Barrett's esophagus without dysplasia: Secondary | ICD-10-CM

## 2012-09-25 NOTE — Progress Notes (Signed)
Denies allergies to eggs or soy products. Denies complications with sedation or anesthesia.

## 2012-10-09 ENCOUNTER — Encounter: Payer: Self-pay | Admitting: Gastroenterology

## 2012-10-09 ENCOUNTER — Ambulatory Visit (AMBULATORY_SURGERY_CENTER): Payer: Medicare Other | Admitting: Gastroenterology

## 2012-10-09 VITALS — BP 105/63 | HR 55 | Temp 97.6°F | Resp 19 | Ht 61.0 in | Wt 147.0 lb

## 2012-10-09 DIAGNOSIS — K2270 Barrett's esophagus without dysplasia: Secondary | ICD-10-CM

## 2012-10-09 DIAGNOSIS — K219 Gastro-esophageal reflux disease without esophagitis: Secondary | ICD-10-CM

## 2012-10-09 MED ORDER — SODIUM CHLORIDE 0.9 % IV SOLN: 500.0000 mL | INTRAVENOUS | Status: DC

## 2012-10-09 NOTE — Patient Instructions (Addendum)
YOU HAD AN ENDOSCOPIC PROCEDURE TODAY AT [facility]: Refer to the procedure report that was given to you for any specific questions about what was found during the examination.  If the procedure report does not answer your questions, please call your gastroenterologist to clarify.  If you requested that your care partner not be given the details of your procedure findings, then the procedure report has been included in a sealed envelope for you to review at your convenience later.  YOU SHOULD EXPECT: Some feelings of bloating in the abdomen. Passage of more gas than usual.  Walking can help get rid of the air that was put into your GI tract during the procedure and reduce the bloating. If you had a lower endoscopy (such as a colonoscopy or flexible sigmoidoscopy) you may notice spotting of blood in your stool or on the toilet paper. If you underwent a bowel prep for your procedure, then you may not have a normal bowel movement for a few days.  DIET: Your first meal following the procedure should be a light meal and then it is ok to progress to your normal diet.  A half-sandwich or bowl of soup is an example of a good first meal.  Heavy or fried foods are harder to digest and may make you feel nauseous or bloated.  Likewise meals heavy in dairy and vegetables can cause extra gas to form and this can also increase the bloating.  Drink plenty of fluids but you should avoid alcoholic beverages for 24 hours.  ACTIVITY: Your care partner should take you home directly after the procedure.  You should plan to take it easy, moving slowly for the rest of the day.  You can resume normal activity the day after the procedure however you should NOT DRIVE or use heavy machinery for 24 hours (because of the sedation medicines used during the test).    SYMPTOMS TO REPORT IMMEDIATELY: A gastroenterologist can be reached at any hour.  During normal business hours, 8:30 AM to 5:00 PM Monday through Friday,  call [phone].  After hours and on weekends, please call the GI answering service at [phone] who will take a message and have the physician on call contact you.   Following upper endoscopy (EGD)  Vomiting of blood or coffee ground material  New chest pain or pain under the shoulder blades  Painful or persistently difficult swallowing  New shortness of breath  Fever of 100F or higher  Black, tarry-looking stools  FOLLOW UP: If any biopsies were taken you will be contacted by phone or by letter within the next 1-3 weeks.  Call your gastroenterologist if you have not heard about the biopsies in 3 weeks.  Our staff will call the home number listed on your records the next business day following your procedure to check on you and address any questions or concerns that you may have at that time regarding the information given to you following your procedure. This is a courtesy call and so if there is no answer at the home number and we have not heard from you through the emergency physician on call, we will assume that you have returned to your regular daily activities without incident.  SIGNATURES/CONFIDENTIALITY: You and/or your care partner have signed paperwork which will be entered into your electronic medical record.  These signatures attest to the fact that that the information above on your After Visit Summary has been reviewed and is understood.  Full responsibility  of the confidentiality of this discharge information lies with you and/or your care-partner.  Continue your anti-reflux medication.  IF Barrett's is detected today, you will need another EGD in 3 years.

## 2012-10-09 NOTE — Progress Notes (Addendum)
Patient did not have preoperative order for IV antibiotic SSI prophylaxis. (G8918)  Patient did not experience any of the following events: a burn prior to discharge; a fall within the facility; wrong site/side/patient/procedure/implant event; or a hospital transfer or hospital admission upon discharge from the facility. (G8907)  

## 2012-10-09 NOTE — Op Note (Signed)
[place]  [address] [place], [address]   ENDOSCOPY PROCEDURE REPORT  PATIENT: Kara, Velazquez  MR#: [id number] BIRTHDATE: 1940/05/22 , 72  yrs. old GENDER: Female ENDOSCOPIST: Ladene Artist, MD, [facility] PROCEDURE DATE:  10/09/2012 PROCEDURE:  EGD w/ biopsy ASA CLASS:     Class II INDICATIONS:  Surveillance.   follow up of Barrett's esophagus. MEDICATIONS: MAC sedation, administered by CRNA and propofol (Diprivan) 161m IV TOPICAL ANESTHETIC: Cetacaine Spray DESCRIPTION OF PROCEDURE: After the risks benefits and alternatives of the procedure were thoroughly explained, informed consent was obtained.  The LB [id number] was introduced through the mouth and advanced to the second portion of the duodenum  without limitations.  The instrument was slowly withdrawn as the mucosa was fully examined.  ESOPHAGUS: A variable Z-line was observed.  Multiple biopsies were performed.  The esophagus was otherwise normal. STOMACH: The mucosa and folds of the stomach appeared normal. DUODENUM: The duodenal mucosa showed no abnormalities in the bulb and second portion of the duodenum. Retroflexed views revealed a small hiatal hernia.  The scope was then withdrawn from the patient and the procedure completed.  COMPLICATIONS: There were no complications.  ENDOSCOPIC IMPRESSION: 1.   Variable Z-line; multiple biopsies 2.   Small hiatal hernia  RECOMMENDATIONS: 1.  Await pathology results; if Barretts noted plan for EGD in 3 years, if no Barretts then no further EGD surveillance 2.  Anti-reflux regimen 3.  Continue PPI   eSigned:  MLadene Artist MD, [facility]09/30/2014 8:18 AM

## 2012-10-09 NOTE — Progress Notes (Signed)
Called to room to assist during endoscopic procedure.  Patient ID and intended procedure confirmed with present staff. Received instructions for my participation in the procedure from the performing physician.  

## 2012-10-10 ENCOUNTER — Telehealth: Payer: Self-pay | Admitting: *Deleted

## 2012-10-10 NOTE — Telephone Encounter (Signed)
  Follow up Call-  Call back number 10/09/2012  Post procedure Call Back phone  # [phone]  Permission to leave phone message Yes     Patient questions:  Do you have a fever, pain , or abdominal swelling? no Pain Score  0 *  Have you tolerated food without any problems? yes  Have you been able to return to your normal activities? yes  Do you have any questions about your discharge instructions: Diet   no Medications  no Follow up visit  no  Do you have questions or concerns about your Care? no  Actions: * If pain score is 4 or above: No action needed, pain <4.

## 2012-10-18 ENCOUNTER — Encounter: Payer: Self-pay | Admitting: Gastroenterology

## 2012-10-26 ENCOUNTER — Telehealth: Payer: Self-pay | Admitting: Gastroenterology

## 2012-10-26 NOTE — Telephone Encounter (Signed)
Discussed letter that pt received in the mail regarding her path and questions were answered.

## 2013-03-12 ENCOUNTER — Other Ambulatory Visit: Payer: Self-pay | Admitting: Family Medicine

## 2013-03-12 ENCOUNTER — Other Ambulatory Visit: Payer: Self-pay

## 2013-03-12 DIAGNOSIS — E2839 Other primary ovarian failure: Secondary | ICD-10-CM

## 2013-03-12 DIAGNOSIS — Z1231 Encounter for screening mammogram for malignant neoplasm of breast: Secondary | ICD-10-CM

## 2013-04-04 ENCOUNTER — Ambulatory Visit
Admission: RE | Admit: 2013-04-04 | Discharge: 2013-04-04 | Disposition: A | Discharge status: Home / Self Care | Payer: Self-pay | Source: Ambulatory Visit | Attending: Family Medicine | Admitting: Family Medicine

## 2013-04-04 ENCOUNTER — Ambulatory Visit
Admission: RE | Admit: 2013-04-04 | Discharge: 2013-04-04 | Disposition: A | Discharge status: Home / Self Care | Payer: Self-pay | Source: Ambulatory Visit

## 2013-04-04 DIAGNOSIS — Z1231 Encounter for screening mammogram for malignant neoplasm of breast: Secondary | ICD-10-CM

## 2013-04-04 DIAGNOSIS — E2839 Other primary ovarian failure: Secondary | ICD-10-CM

## 2013-08-04 ENCOUNTER — Emergency Department (HOSPITAL_COMMUNITY): Payer: Medicare Other

## 2013-08-04 ENCOUNTER — Encounter (HOSPITAL_COMMUNITY): Payer: Self-pay | Admitting: Emergency Medicine

## 2013-08-04 ENCOUNTER — Inpatient Hospital Stay (HOSPITAL_COMMUNITY)
Admission: EM | Admit: 2013-08-04 | Discharge: 2013-08-09 | DRG: 392 | Disposition: A | Discharge status: Home / Self Care | Payer: Medicare Other | Attending: Internal Medicine | Admitting: Internal Medicine

## 2013-08-04 DIAGNOSIS — N840 Polyp of corpus uteri: Secondary | ICD-10-CM | POA: Diagnosis present

## 2013-08-04 DIAGNOSIS — K589 Irritable bowel syndrome without diarrhea: Secondary | ICD-10-CM | POA: Diagnosis present

## 2013-08-04 DIAGNOSIS — K5720 Diverticulitis of large intestine with perforation and abscess without bleeding: Secondary | ICD-10-CM | POA: Diagnosis present

## 2013-08-04 DIAGNOSIS — E876 Hypokalemia: Secondary | ICD-10-CM | POA: Diagnosis present

## 2013-08-04 DIAGNOSIS — K2270 Barrett's esophagus without dysplasia: Secondary | ICD-10-CM | POA: Diagnosis present

## 2013-08-04 DIAGNOSIS — K900 Celiac disease: Secondary | ICD-10-CM | POA: Diagnosis present

## 2013-08-04 DIAGNOSIS — R9389 Abnormal findings on diagnostic imaging of other specified body structures: Secondary | ICD-10-CM | POA: Diagnosis present

## 2013-08-04 DIAGNOSIS — M1990 Unspecified osteoarthritis, unspecified site: Secondary | ICD-10-CM | POA: Diagnosis present

## 2013-08-04 DIAGNOSIS — Z87891 Personal history of nicotine dependence: Secondary | ICD-10-CM

## 2013-08-04 DIAGNOSIS — I10 Essential (primary) hypertension: Secondary | ICD-10-CM | POA: Diagnosis present

## 2013-08-04 DIAGNOSIS — K630 Abscess of intestine: Secondary | ICD-10-CM

## 2013-08-04 DIAGNOSIS — Z823 Family history of stroke: Secondary | ICD-10-CM

## 2013-08-04 DIAGNOSIS — D72829 Elevated white blood cell count, unspecified: Secondary | ICD-10-CM | POA: Diagnosis present

## 2013-08-04 DIAGNOSIS — Z8249 Family history of ischemic heart disease and other diseases of the circulatory system: Secondary | ICD-10-CM | POA: Diagnosis not present

## 2013-08-04 DIAGNOSIS — R109 Unspecified abdominal pain: Secondary | ICD-10-CM | POA: Diagnosis not present

## 2013-08-04 DIAGNOSIS — K5900 Constipation, unspecified: Secondary | ICD-10-CM | POA: Diagnosis present

## 2013-08-04 DIAGNOSIS — K219 Gastro-esophageal reflux disease without esophagitis: Secondary | ICD-10-CM | POA: Diagnosis present

## 2013-08-04 DIAGNOSIS — K5732 Diverticulitis of large intestine without perforation or abscess without bleeding: Principal | ICD-10-CM | POA: Diagnosis present

## 2013-08-04 DIAGNOSIS — Z803 Family history of malignant neoplasm of breast: Secondary | ICD-10-CM | POA: Diagnosis not present

## 2013-08-04 DIAGNOSIS — E871 Hypo-osmolality and hyponatremia: Secondary | ICD-10-CM

## 2013-08-04 LAB — CBC WITH DIFFERENTIAL/PLATELET
BASOS ABS: 0 10*3/uL (ref 0.0–0.1)
BASOS PCT: 0 % (ref 0–1)
EOS ABS: 0 10*3/uL (ref 0.0–0.7)
Eosinophils Relative: 0 % (ref 0–5)
HCT: 38.5 % (ref 36.0–46.0)
Hemoglobin: 12.9 g/dL (ref 12.0–15.0)
Lymphocytes Relative: 8 % — ABNORMAL LOW (ref 12–46)
Lymphs Abs: 1.3 10*3/uL (ref 0.7–4.0)
MCH: 30 pg (ref 26.0–34.0)
MCHC: 33.5 g/dL (ref 30.0–36.0)
MCV: 89.5 fL (ref 78.0–100.0)
MONO ABS: 1.5 10*3/uL — AB (ref 0.1–1.0)
MONOS PCT: 9 % (ref 3–12)
NEUTROS ABS: 13.4 10*3/uL — AB (ref 1.7–7.7)
NEUTROS PCT: 83 % — AB (ref 43–77)
Platelets: 358 10*3/uL (ref 150–400)
RBC: 4.3 MIL/uL (ref 3.87–5.11)
RDW: 13.3 % (ref 11.5–15.5)
WBC: 16.3 10*3/uL — ABNORMAL HIGH (ref 4.0–10.5)

## 2013-08-04 LAB — POC OCCULT BLOOD, ED: Fecal Occult Bld: NEGATIVE

## 2013-08-04 LAB — URINALYSIS, ROUTINE W REFLEX MICROSCOPIC
Bilirubin Urine: NEGATIVE
Glucose, UA: NEGATIVE mg/dL
Hgb urine dipstick: NEGATIVE
Ketones, ur: 40 mg/dL — AB
NITRITE: NEGATIVE
PH: 6.5 (ref 5.0–8.0)
Protein, ur: NEGATIVE mg/dL
SPECIFIC GRAVITY, URINE: 1.019 (ref 1.005–1.030)
UROBILINOGEN UA: 0.2 mg/dL (ref 0.0–1.0)

## 2013-08-04 LAB — COMPREHENSIVE METABOLIC PANEL
ALBUMIN: 3.6 g/dL (ref 3.5–5.2)
ALT: 9 U/L (ref 0–35)
ANION GAP: 19 — AB (ref 5–15)
AST: 16 U/L (ref 0–37)
Alkaline Phosphatase: 116 U/L (ref 39–117)
BILIRUBIN TOTAL: 0.7 mg/dL (ref 0.3–1.2)
BUN: 14 mg/dL (ref 6–23)
CHLORIDE: 93 meq/L — AB (ref 96–112)
CO2: 20 mEq/L (ref 19–32)
CREATININE: 0.71 mg/dL (ref 0.50–1.10)
Calcium: 9.4 mg/dL (ref 8.4–10.5)
GFR calc Af Amer: >90 mL/min (ref >90)
GFR calc non Af Amer: 82 mL/min — ABNORMAL LOW (ref >90)
Glucose, Bld: 114 mg/dL — ABNORMAL HIGH (ref 70–99)
POTASSIUM: 4.2 meq/L (ref 3.7–5.3)
Sodium: 132 mEq/L — ABNORMAL LOW (ref 137–147)
TOTAL PROTEIN: 7.7 g/dL (ref 6.0–8.3)

## 2013-08-04 LAB — URINE MICROSCOPIC-ADD ON

## 2013-08-04 LAB — LIPASE, BLOOD: LIPASE: 14 U/L (ref 11–59)

## 2013-08-04 MED ORDER — ONDANSETRON HCL 4 MG/2ML IJ SOLN: 4.0000 mg | Freq: Three times a day (TID) | INTRAMUSCULAR | Status: DC | PRN

## 2013-08-04 MED ORDER — SODIUM CHLORIDE 0.9 % IV SOLN
INTRAVENOUS | Status: DC
  Administered 2013-08-04 – 2013-08-06 (×4): via INTRAVENOUS

## 2013-08-04 MED ORDER — OLOPATADINE HCL 0.1 % OP SOLN
1.0000 [drp] | Freq: Two times a day (BID) | OPHTHALMIC | Status: DC
  Filled 2013-08-04: qty 5

## 2013-08-04 MED ORDER — ONDANSETRON HCL 4 MG/2ML IJ SOLN: 4.0000 mg | Freq: Four times a day (QID) | INTRAMUSCULAR | Status: DC | PRN

## 2013-08-04 MED ORDER — POLYETHYLENE GLYCOL 3350 17 G PO PACK
17.0000 g | PACK | Freq: Two times a day (BID) | ORAL | Status: DC
  Administered 2013-08-04 – 2013-08-07 (×5): 17 g via ORAL
  Filled 2013-08-04 (×7): qty 1

## 2013-08-04 MED ORDER — PANTOPRAZOLE SODIUM 40 MG IV SOLR
40.0000 mg | Freq: Every day | INTRAVENOUS | Status: DC
  Administered 2013-08-04 – 2013-08-08 (×5): 40 mg via INTRAVENOUS
  Filled 2013-08-04 (×6): qty 40

## 2013-08-04 MED ORDER — IOHEXOL 300 MG/ML  SOLN
100.0000 mL | Freq: Once | INTRAMUSCULAR | Status: AC | PRN
  Administered 2013-08-04: 100 mL via INTRAVENOUS

## 2013-08-04 MED ORDER — ONDANSETRON HCL 4 MG PO TABS: 4.0000 mg | ORAL_TABLET | Freq: Four times a day (QID) | ORAL | Status: DC | PRN

## 2013-08-04 MED ORDER — HEPARIN SODIUM (PORCINE) 5000 UNIT/ML IJ SOLN
5000.0000 [IU] | Freq: Three times a day (TID) | INTRAMUSCULAR | Status: DC
Start: 2013-08-04 — End: 2013-08-09
  Administered 2013-08-06 – 2013-08-09 (×9): 5000 [IU] via SUBCUTANEOUS
  Filled 2013-08-04 (×17): qty 1

## 2013-08-04 MED ORDER — ACETAMINOPHEN 650 MG RE SUPP: 650.0000 mg | Freq: Four times a day (QID) | RECTAL | Status: DC | PRN

## 2013-08-04 MED ORDER — PIPERACILLIN-TAZOBACTAM 3.375 G IVPB 30 MIN
3.3750 g | Freq: Once | INTRAVENOUS | Status: AC
  Administered 2013-08-04: 3.375 g via INTRAVENOUS
  Filled 2013-08-04: qty 50

## 2013-08-04 MED ORDER — HYDRALAZINE HCL 20 MG/ML IJ SOLN
10.0000 mg | Freq: Four times a day (QID) | INTRAMUSCULAR | Status: DC | PRN
  Filled 2013-08-04: qty 0.5

## 2013-08-04 MED ORDER — SENNA 8.6 MG PO TABS
1.0000 | ORAL_TABLET | Freq: Two times a day (BID) | ORAL | Status: DC
  Administered 2013-08-04 – 2013-08-07 (×6): 8.6 mg via ORAL
  Filled 2013-08-04 (×6): qty 1

## 2013-08-04 MED ORDER — ACETAMINOPHEN 325 MG PO TABS: 650.0000 mg | ORAL_TABLET | Freq: Four times a day (QID) | ORAL | Status: DC | PRN

## 2013-08-04 MED ORDER — GUAIFENESIN-DM 100-10 MG/5ML PO SYRP: 5.0000 mL | ORAL_SOLUTION | ORAL | Status: DC | PRN

## 2013-08-04 MED ORDER — HYDROMORPHONE HCL PF 1 MG/ML IJ SOLN: 0.5000 mg | INTRAMUSCULAR | Status: AC | PRN

## 2013-08-04 MED ORDER — RISAQUAD PO CAPS
1.0000 | ORAL_CAPSULE | Freq: Every day | ORAL | Status: DC
  Administered 2013-08-05 – 2013-08-09 (×5): 1 via ORAL
  Filled 2013-08-04 (×5): qty 1

## 2013-08-04 MED ORDER — ALBUTEROL SULFATE (2.5 MG/3ML) 0.083% IN NEBU: 2.5000 mg | INHALATION_SOLUTION | RESPIRATORY_TRACT | Status: DC | PRN

## 2013-08-04 MED ORDER — IOHEXOL 300 MG/ML  SOLN
50.0000 mL | Freq: Once | INTRAMUSCULAR | Status: AC | PRN
  Administered 2013-08-04: 50 mL via ORAL

## 2013-08-04 MED ORDER — CYCLOSPORINE 0.05 % OP EMUL
1.0000 [drp] | Freq: Two times a day (BID) | OPHTHALMIC | Status: DC
  Administered 2013-08-04 – 2013-08-09 (×10): 1 [drp] via OPHTHALMIC
  Filled 2013-08-04 (×11): qty 1

## 2013-08-04 MED ORDER — PIPERACILLIN-TAZOBACTAM 3.375 G IVPB
3.3750 g | Freq: Once | INTRAVENOUS | Status: DC
  Administered 2013-08-04: 3.375 g via INTRAVENOUS

## 2013-08-04 MED ORDER — SODIUM CHLORIDE 0.9 % IV BOLUS (SEPSIS)
500.0000 mL | Freq: Once | INTRAVENOUS | Status: AC
  Administered 2013-08-04: 500 mL via INTRAVENOUS

## 2013-08-04 MED ORDER — MORPHINE SULFATE 2 MG/ML IJ SOLN
1.0000 mg | INTRAMUSCULAR | Status: DC | PRN
  Administered 2013-08-05: 2 mg via INTRAVENOUS
  Filled 2013-08-04: qty 1

## 2013-08-04 MED ORDER — OXYCODONE HCL 5 MG PO TABS
5.0000 mg | ORAL_TABLET | ORAL | Status: DC | PRN
  Administered 2013-08-05: 5 mg via ORAL
  Filled 2013-08-04: qty 1

## 2013-08-04 MED ORDER — SODIUM CHLORIDE 0.9 % IV SOLN
3.0000 g | Freq: Three times a day (TID) | INTRAVENOUS | Status: DC
  Administered 2013-08-04 – 2013-08-05 (×2): 3 g via INTRAVENOUS
  Filled 2013-08-04 (×3): qty 3

## 2013-08-04 MED ORDER — METOPROLOL TARTRATE 1 MG/ML IV SOLN
5.0000 mg | Freq: Three times a day (TID) | INTRAVENOUS | Status: DC
  Administered 2013-08-04 – 2013-08-09 (×14): 5 mg via INTRAVENOUS
  Filled 2013-08-04 (×17): qty 5

## 2013-08-04 NOTE — Consult Note (Signed)
General Surgery [facility]  Reason for Consult: abdominal pain, acute diverticulitis with possible abscess  Referring Physician Assistant:  Alecia Lemming, PA-C  [name] is an 73 y.o. female.   HPI: patient is a 73 yo WF with left sided abdominal pain for 4-5 days.  Presented to primary MD, Dr. Cari Caraway and was treated for presumed UTI.  Moderate constipation.  Prior history of diverticular disease.  Appendectomy as 35 yo child.  Presented to ER for evaluation.  Elevated WBC.  CT abdomen shows acute diverticulitis with large stool burden.  Small fluid collection representing possible early abscess, approx 3 cm in size, between sigmoid colon and uterus.  General surgery asked to see as consultant for management.  Past Medical History  Diagnosis Date  . Hypertension   . Celiac disease     per Dr. Fuller Plan, is questionable.   . Diverticulitis     and diverticulosis in sigmoid colon   . Heart murmur   . IBS (irritable bowel syndrome)   . GERD (gastroesophageal reflux disease)   . Erosive esophagitis     with hiatal hernia, Barrett's esophagus 02/2004  . Degenerative joint disease     Past Surgical History  Procedure Laterality Date  . Appendectomy    . Carpal tunnel release    . Tonsillectomy    . Knee arthroscopy    . Foot surgery      Family History  Problem Relation Age of Onset  . Breast cancer Sister   . Heart disease Father   . Stroke Mother     multiple  . Colon cancer Neg Hx   . Esophageal cancer Neg Hx   . Rectal cancer Neg Hx   . Stomach cancer Neg Hx     Social History:  reports that she quit smoking about 46 years ago. Her smoking use included Cigarettes. She smoked 0.00 packs per day. She has never used smokeless tobacco. She reports that she does not drink alcohol or use illicit drugs.  Allergies:  Allergies  Allergen Reactions  . Gluten Meal     Diarrhea, vomiting that lasts 3-4 hours  . Ace Inhibitors     Coughing  .  Ciprofloxacin     Joint pain  . Ibuprofen     Joint pain  . Levofloxacin     Pain in tendons  . Simvastatin Other (See Comments)    Muscle pain    Medications: I have reviewed the patient's current medications.  Results for orders placed during the hospital encounter of 08/04/13 (from the past 48 hour(s))  CBC WITH DIFFERENTIAL     Status: Abnormal   Collection Time    08/04/13  4:35 PM      Result Value Ref Range   WBC 16.3 (*) 4.0 - 10.5 K/uL   RBC 4.30  3.87 - 5.11 MIL/uL   Hemoglobin 12.9  12.0 - 15.0 g/dL   HCT 38.5  36.0 - 46.0 %   MCV 89.5  78.0 - 100.0 fL   MCH 30.0  26.0 - 34.0 pg   MCHC 33.5  30.0 - 36.0 g/dL   RDW 13.3  11.5 - 15.5 %   Platelets 358  150 - 400 K/uL   Neutrophils Relative % 83 (*) 43 - 77 %   Neutro Abs 13.4 (*) 1.7 - 7.7 K/uL   Lymphocytes Relative 8 (*) 12 - 46 %   Lymphs Abs 1.3  0.7 - 4.0 K/uL   Monocytes  Relative 9  3 - 12 %   Monocytes Absolute 1.5 (*) 0.1 - 1.0 K/uL   Eosinophils Relative 0  0 - 5 %   Eosinophils Absolute 0.0  0.0 - 0.7 K/uL   Basophils Relative 0  0 - 1 %   Basophils Absolute 0.0  0.0 - 0.1 K/uL  COMPREHENSIVE METABOLIC PANEL     Status: Abnormal   Collection Time    08/04/13  4:35 PM      Result Value Ref Range   Sodium 132 (*) 137 - 147 mEq/L   Potassium 4.2  3.7 - 5.3 mEq/L   Chloride 93 (*) 96 - 112 mEq/L   CO2 20  19 - 32 mEq/L   Glucose, Bld 114 (*) 70 - 99 mg/dL   BUN 14  6 - 23 mg/dL   Creatinine, Ser 0.71  0.50 - 1.10 mg/dL   Calcium 9.4  8.4 - 10.5 mg/dL   Total Protein 7.7  6.0 - 8.3 g/dL   Albumin 3.6  3.5 - 5.2 g/dL   AST 16  0 - 37 U/L   ALT 9  0 - 35 U/L   Alkaline Phosphatase 116  39 - 117 U/L   Total Bilirubin 0.7  0.3 - 1.2 mg/dL   GFR calc non Af Amer 82 (*) >90 mL/min   GFR calc Af Amer >90  >90 mL/min   Comment: (NOTE)     The eGFR has been calculated using the CKD EPI equation.     This calculation has not been validated in all clinical situations.     eGFR's persistently <90 mL/min  signify possible Chronic Kidney     Disease.   Anion gap 19 (*) 5 - 15  LIPASE, BLOOD     Status: None   Collection Time    08/04/13  4:35 PM      Result Value Ref Range   Lipase 14  11 - 59 U/L  POC OCCULT BLOOD, ED     Status: None   Collection Time    08/04/13  6:10 PM      Result Value Ref Range   Fecal Occult Bld NEGATIVE  NEGATIVE  URINALYSIS, ROUTINE W REFLEX MICROSCOPIC     Status: Abnormal   Collection Time    08/04/13  7:19 PM      Result Value Ref Range   Color, Urine YELLOW  YELLOW   APPearance CLEAR  CLEAR   Specific Gravity, Urine 1.019  1.005 - 1.030   pH 6.5  5.0 - 8.0   Glucose, UA NEGATIVE  NEGATIVE mg/dL   Hgb urine dipstick NEGATIVE  NEGATIVE   Bilirubin Urine NEGATIVE  NEGATIVE   Ketones, ur 40 (*) NEGATIVE mg/dL   Protein, ur NEGATIVE  NEGATIVE mg/dL   Urobilinogen, UA 0.2  0.0 - 1.0 mg/dL   Nitrite NEGATIVE  NEGATIVE   Leukocytes, UA TRACE (*) NEGATIVE  URINE MICROSCOPIC-ADD ON     Status: None   Collection Time    08/04/13  7:19 PM      Result Value Ref Range   WBC, UA 0-2  <3 WBC/hpf    Ct Abdomen Pelvis W Contrast  08/04/2013   CLINICAL DATA:  Abdominal pain.  EXAM: CT ABDOMEN AND PELVIS WITH CONTRAST  TECHNIQUE: Multidetector CT imaging of the abdomen and pelvis was performed using the standard protocol following bolus administration of intravenous contrast.  CONTRAST:  66m OMNIPAQUE IOHEXOL 300 MG/ML SOLN, 1031mOMNIPAQUE IOHEXOL 300 MG/ML SOLN  COMPARISON:  CT scan 06/24/2013  FINDINGS: The lung bases are clear. The heart is normal in size. No pericardial effusion. The distal esophagus is grossly normal. A small hiatal hernia is noted.  The liver is unremarkable and stable. The gallbladder is normal. No common bowel duct dilatation. The pancreas is normal. The spleen is normal. The adrenal glands and kidneys are normal except for a small stable right renal cyst.  The stomach, duodenum, small bowel and terminal ileum are normal.  There is a large  amount of stool throughout the colon down to the upper sigmoid area where there is marked inflammation of the sigmoid colon with wall thickening and pericolonic inflammatory change. A small diverticular abscess is suggested between the colon in the uterus. This measures approximately 3 cm. Other smaller intramural abscesses are possible. This inflammatory process may be causing a functional obstruction and the significant constipation. I do not see a discrete mass. There are small adjacent lymph nodes which are likely hyperplastic/inflammatory.  The bladder is unremarkable. The uterus appears to have a thickened endometrium. Recommend followup pelvic ultrasound to exclude endometrial hyperplasia. The ovaries are normal.  The aorta is normal in caliber. The branch vessels are patent. Scattered atherosclerotic calcifications are stable. No mesenteric or retroperitoneal mass or adenopathy. The major venous structures are patent.  The bony structures are intact.  IMPRESSION: 1. Large segment of diverticulitis involving the sigmoid colon likely causing a functional obstruction with fairly significant constipation. Suspect a 3 cm diverticular abscess between the sigmoid colon in the uterus. A followup CT scan after appropriate antibiotic therapy may be helpful to re-evaluate (2-3 weeks). 2. Possible thickened endometrium for age. Pelvic ultrasound may be helpful for further evaluation. 3. Normal appearance of the solid abdominal organs.   Electronically Signed   By: Kalman Jewels M.D.   On: 08/04/2013 19:52    Review of Systems  Constitutional: Positive for diaphoresis.  HENT: Negative.   Eyes: Negative.   Respiratory: Negative.   Cardiovascular: Negative.   Gastrointestinal: Positive for abdominal pain (LLQ and suprapubic) and constipation.  Genitourinary: Negative.   Musculoskeletal: Negative.   Skin: Negative.   Neurological: Negative.   Endo/Heme/Allergies: Negative.   Psychiatric/Behavioral:  Negative.    Blood pressure 141/59, pulse 89, temperature 98.5 F (36.9 C), temperature source Oral, resp. rate 20, SpO2 96.00%. Physical Exam  Constitutional: She is oriented to person, place, and time. She appears well-developed and well-nourished. No distress.  HENT:  Head: Normocephalic and atraumatic.  Right Ear: External ear normal.  Left Ear: External ear normal.  Mouth/Throat: No oropharyngeal exudate.  Eyes: Conjunctivae are normal. Pupils are equal, round, and reactive to light. No scleral icterus.  Neck: Normal range of motion. Neck supple. No tracheal deviation present. No thyromegaly present.  Cardiovascular: Normal rate, regular rhythm and normal heart sounds.   No murmur heard. Respiratory: Effort normal and breath sounds normal. She has no wheezes. She has no rales.  GI: Soft. Bowel sounds are normal. She exhibits no distension and no mass. There is tenderness (suprapubic and LLQ). There is guarding. There is no rebound.  Musculoskeletal: Normal range of motion. She exhibits no edema.  Lymphadenopathy:    She has no cervical adenopathy.  Neurological: She is alert and oriented to person, place, and time.  Skin: Skin is warm and dry.  Psychiatric: She has a normal mood and affect. Her behavior is normal.    Assessment/Plan: Acute diverticulitis with possible early abscess formation  Agree with admission to medical  service  IV Unasyn started  Keep NPO with ice chips for now  Will need repeat CT in 3-4 days to assess development of possible abscess.  If increase in size, may require percutaneous drainage.  Will follow closely with you   Discussed plan with patient and daughter at bedside.  Explained that she would be here for several days of antibiotics, and that we would repeat a CT scan in 3-4 days.  May require percutaneous drainage if abscess develops.  May yet require laparotomy and even possible colostomy.  They understand and agree with plan.  Earnstine Regal,  MD, [facility] Office: [place] 08/04/2013, 10:23 PM

## 2013-08-04 NOTE — ED Provider Notes (Signed)
CSN: [id number]     Arrival date & time 08/04/13  1605 History   First MD Initiated Contact with Patient 08/04/13 1734     Chief Complaint  Patient presents with  . Dysuria  . Constipation     (Consider location/radiation/quality/duration/timing/severity/associated sxs/prior Treatment) HPI Comments: Patient with history of chronic urinary tract infections, diverticulitis, appendectomy -- presents with one-week history of dysuria and lower abdominal pain with associated constipation. Patient saw her primary care physician last week and had a negative UA 3 days ago. Patient has not had a bowel movement in 8 days but is passing gas. She denies fever, vomiting. She has not seen blood in her stool or in her urine. Patient was placed prophylactically on antibiotic for her dysuria. Patient was given mag citrate and suppositories for constipation which has not helped. The onset of this condition was acute. The course is constant. Aggravating factors: Palpation. Alleviating factors: none.    Patient is a 73 y.o. female presenting with dysuria and constipation. The history is provided by the patient, medical records and a relative.  Dysuria Associated symptoms: abdominal pain   Associated symptoms: no fever, no nausea and no vomiting   Constipation Associated symptoms: abdominal pain and dysuria   Associated symptoms: no diarrhea, no fever, no nausea and no vomiting     Past Medical History  Diagnosis Date  . Hypertension   . Celiac disease     per Dr. Fuller Plan, is questionable.   . Diverticulitis     and diverticulosis in sigmoid colon   . Heart murmur   . IBS (irritable bowel syndrome)   . GERD (gastroesophageal reflux disease)   . Erosive esophagitis     with hiatal hernia, Barrett's esophagus 02/2004  . Degenerative joint disease    Past Surgical History  Procedure Laterality Date  . Appendectomy    . Carpal tunnel release    . Tonsillectomy    . Knee arthroscopy    . Foot surgery      Family History  Problem Relation Age of Onset  . Breast cancer Sister   . Heart disease Father   . Stroke Mother     multiple  . Colon cancer Neg Hx   . Esophageal cancer Neg Hx   . Rectal cancer Neg Hx   . Stomach cancer Neg Hx    History  Substance Use Topics  . Smoking status: Former Smoker    Types: Cigarettes    Quit date: 03/18/1967  . Smokeless tobacco: Never Used  . Alcohol Use: No   OB History   Grav Para Term Preterm Abortions TAB SAB Ect Mult Living                 Review of Systems  Constitutional: Negative for fever.  HENT: Negative for rhinorrhea and sore throat.   Eyes: Negative for redness.  Respiratory: Negative for cough.   Cardiovascular: Negative for chest pain.  Gastrointestinal: Positive for abdominal pain and constipation. Negative for nausea, vomiting and diarrhea.  Genitourinary: Positive for dysuria.  Musculoskeletal: Negative for myalgias.  Skin: Negative for rash.  Neurological: Negative for headaches.      Allergies  Gluten meal; Ace inhibitors; Ciprofloxacin; Ibuprofen; Levofloxacin; and Simvastatin  Home Medications   Prior to Admission medications   Medication Sig Start Date End Date Taking? Authorizing Provider  acidophilus (RISAQUAD) CAPS Take 1 capsule by mouth daily.   Yes Historical Provider, MD  amLODipine (NORVASC) 5 MG tablet Take 5 mg by mouth  daily.   Yes Historical Provider, MD  amoxicillin (AMOXIL) 875 MG tablet Take 875 mg by mouth 2 (two) times daily. 08/01/13  Yes Historical Provider, MD  aspirin EC 81 MG tablet Take 81 mg by mouth daily.   Yes Historical Provider, MD  Bepotastine Besilate (BEPREVE) 1.5 % SOLN Place 1 drop into both eyes daily.    Yes Historical Provider, MD  bisacodyl (DULCOLAX) 10 MG suppository Place 10 mg rectally as needed for moderate constipation.   Yes Historical Provider, MD  cholecalciferol (VITAMIN D) 1000 UNITS tablet Take 1,000 Units by mouth daily.   Yes Historical Provider, MD   cycloSPORINE (RESTASIS) 0.05 % ophthalmic emulsion Place 1 drop into both eyes 2 (two) times daily.   Yes Historical Provider, MD  desonide (DESOWEN) 0.05 % cream Apply 1 application topically 2 (two) times daily.   Yes Historical Provider, MD  magnesium citrate SOLN Take 1 Bottle by mouth once.   Yes Historical Provider, MD  metoprolol succinate (TOPROL-XL) 50 MG 24 hr tablet Take 50 mg by mouth daily. Take with or immediately following a meal.   Yes Historical Provider, MD  Nutritional Supplements (JUICE PLUS FIBRE PO) Take 4 capsules by mouth daily.   Yes Historical Provider, MD  oxybutynin (DITROPAN-XL) 10 MG 24 hr tablet Take 10 mg by mouth at bedtime.   Yes Historical Provider, MD  pantoprazole (PROTONIX) 40 MG tablet Take 40 mg by mouth 2 (two) times daily.   Yes Historical Provider, MD  losartan (COZAAR) 100 MG tablet Take 1 tablet (100 mg total) by mouth daily. 02/04/11 02/04/12  Sheila Oats, MD   BP 148/79  Pulse 70  Temp(Src) 98.4 F (36.9 C) (Oral)  Resp 20  SpO2 100%  Physical Exam  Nursing note and vitals reviewed. Constitutional: She appears well-developed and well-nourished.  HENT:  Head: Normocephalic and atraumatic.  Eyes: Conjunctivae are normal. Right eye exhibits no discharge. Left eye exhibits no discharge.  Neck: Normal range of motion. Neck supple.  Cardiovascular: Normal rate, regular rhythm and normal heart sounds.   Pulmonary/Chest: Effort normal and breath sounds normal.  Abdominal: Soft. There is tenderness (moderate tenderness to palpation) in the right lower quadrant and left lower quadrant.  Genitourinary: Rectal exam shows tenderness (Pressure). Rectal exam shows no external hemorrhoid, no fissure, no mass and anal tone normal. Guaiac negative stool.  No fecal impaction  Neurological: She is alert.  Skin: Skin is warm and dry.  Psychiatric: She has a normal mood and affect.    ED Course  Procedures (including critical care time) Labs  Review Labs Reviewed  CBC WITH DIFFERENTIAL - Abnormal; Notable for the following:    WBC 16.3 (*)    Neutrophils Relative % 83 (*)    Neutro Abs 13.4 (*)    Lymphocytes Relative 8 (*)    Monocytes Absolute 1.5 (*)    All other components within normal limits  COMPREHENSIVE METABOLIC PANEL - Abnormal; Notable for the following:    Sodium 132 (*)    Chloride 93 (*)    Glucose, Bld 114 (*)    GFR calc non Af Amer 82 (*)    Anion gap 19 (*)    All other components within normal limits  URINALYSIS, ROUTINE W REFLEX MICROSCOPIC - Abnormal; Notable for the following:    Ketones, ur 40 (*)    Leukocytes, UA TRACE (*)    All other components within normal limits  LIPASE, BLOOD  URINE MICROSCOPIC-ADD ON  POC OCCULT  BLOOD, ED    Imaging Review Ct Abdomen Pelvis W Contrast  08/04/2013   CLINICAL DATA:  Abdominal pain.  EXAM: CT ABDOMEN AND PELVIS WITH CONTRAST  TECHNIQUE: Multidetector CT imaging of the abdomen and pelvis was performed using the standard protocol following bolus administration of intravenous contrast.  CONTRAST:  46m OMNIPAQUE IOHEXOL 300 MG/ML SOLN, 1062mOMNIPAQUE IOHEXOL 300 MG/ML SOLN  COMPARISON:  CT scan 06/24/2013  FINDINGS: The lung bases are clear. The heart is normal in size. No pericardial effusion. The distal esophagus is grossly normal. A small hiatal hernia is noted.  The liver is unremarkable and stable. The gallbladder is normal. No common bowel duct dilatation. The pancreas is normal. The spleen is normal. The adrenal glands and kidneys are normal except for a small stable right renal cyst.  The stomach, duodenum, small bowel and terminal ileum are normal.  There is a large amount of stool throughout the colon down to the upper sigmoid area where there is marked inflammation of the sigmoid colon with wall thickening and pericolonic inflammatory change. A small diverticular abscess is suggested between the colon in the uterus. This measures approximately 3 cm. Other  smaller intramural abscesses are possible. This inflammatory process may be causing a functional obstruction and the significant constipation. I do not see a discrete mass. There are small adjacent lymph nodes which are likely hyperplastic/inflammatory.  The bladder is unremarkable. The uterus appears to have a thickened endometrium. Recommend followup pelvic ultrasound to exclude endometrial hyperplasia. The ovaries are normal.  The aorta is normal in caliber. The branch vessels are patent. Scattered atherosclerotic calcifications are stable. No mesenteric or retroperitoneal mass or adenopathy. The major venous structures are patent.  The bony structures are intact.  IMPRESSION: 1. Large segment of diverticulitis involving the sigmoid colon likely causing a functional obstruction with fairly significant constipation. Suspect a 3 cm diverticular abscess between the sigmoid colon in the uterus. A followup CT scan after appropriate antibiotic therapy may be helpful to re-evaluate (2-3 weeks). 2. Possible thickened endometrium for age. Pelvic ultrasound may be helpful for further evaluation. 3. Normal appearance of the solid abdominal organs.   Electronically Signed   By: MaKalman Jewels.D.   On: 08/04/2013 19:52     EKG Interpretation None      6:01 PM Patient seen and examined. Work-up initiated. Medications ordered.   Vital signs reviewed and are as follows: Filed Vitals:   08/04/13 1614  BP: 148/79  Pulse: 70  Temp: 98.4 F (36.9 C)  Resp: 20   Rectal exam performed with nurse chaperone. CT pending.   8:51 PM Patient previously discussed with Dr. JaJeneen RinksImaging reviewed with Dr. JaJeneen Rinks  Patient started on Zosyn. Pain is controlled. Patient and daughter updated at bedside.   Spoke with Dr. GeHarlow Asaf general surgery who will see. Reccs: Abx, bowel rest, general medicine admit.   I have spoken with Dr. GhSloan Leiterho will admit.      MDM   Final diagnoses:  Diverticulitis of large  intestine with abscess without bleeding   Admit to medicine, surgery on board.     JoCarlisle CaterPA-C 08/04/13 2053

## 2013-08-04 NOTE — ED Notes (Addendum)
Pt started having dysuria x1 week. Went to pcp on Thursday, urine sample came back negative from Thursday. Pain 6/10. Constipation x8 days, pt is passing gas today. Abdominal pain x1 week.  Hx of diverticulitis.   Chronic UTI infections on and off x8 weeks.

## 2013-08-04 NOTE — H&P (Signed)
PATIENT DETAILS Name: [name] Age: 73 y.o. Sex: female Date of Birth: 1940/11/22 Admit Date: 08/04/2013 PTW:SFKCLEX,NTZGY, MD   CHIEF COMPLAINT:  Lower Abdominal Pain-7 days  HPI: [name] is a 73 y.o. female with a Past Medical History of hypertension, prior diverticulitis, frequent UTIs who presents today with the above noted complaint. Per patient, she has had intermittent lower abdominal pain going on for the past few months, however for the past one week or so, she has had persistent lower abdominal pain. Her abdominal pain is around 6-7/10, without any radiation.It is associated with low-grade fever-claims to have had 100.66F the fever at home. It is also associated with few episodes of nausea and vomiting. Patient claims to have ongoing dysuria as well. She claims she has not had a decent bowel movement over the past few days, apparently her primary care practitioner prescribed her magnesium citrate, following which she's had a few bowel movements, her last bowel movement was earlier this evening. Because of persistent abdominal pain, she presented to the emergency room, where a CT scan of the abdomen revealed diverticulitis of the sigmoid colon, along with constipation and a 3 cm diverticular abscess. In general surgery has already been consulted by the emergency room physician, we are awaiting further recommendations from surgery, however patient he admitted to the hospitalist service for further evaluation and treatment.   ALLERGIES:   Allergies  Allergen Reactions  . Gluten Meal     Diarrhea, vomiting that lasts 3-4 hours  . Ace Inhibitors     Coughing  . Ciprofloxacin     Joint pain  . Ibuprofen     Joint pain  . Levofloxacin     Pain in tendons  . Simvastatin Other (See Comments)    Muscle pain    PAST MEDICAL HISTORY: Past Medical History  Diagnosis Date  . Hypertension   . Celiac disease     per Dr. Fuller Plan, is questionable.   . Diverticulitis     and  diverticulosis in sigmoid colon   . Heart murmur   . IBS (irritable bowel syndrome)   . GERD (gastroesophageal reflux disease)   . Erosive esophagitis     with hiatal hernia, Barrett's esophagus 02/2004  . Degenerative joint disease     PAST SURGICAL HISTORY: Past Surgical History  Procedure Laterality Date  . Appendectomy    . Carpal tunnel release    . Tonsillectomy    . Knee arthroscopy    . Foot surgery      MEDICATIONS AT HOME: Prior to Admission medications   Medication Sig Start Date End Date Taking? Authorizing Provider  acidophilus (RISAQUAD) CAPS Take 1 capsule by mouth daily.   Yes Historical Provider, MD  amLODipine (NORVASC) 5 MG tablet Take 5 mg by mouth daily.   Yes Historical Provider, MD  amoxicillin (AMOXIL) 875 MG tablet Take 875 mg by mouth 2 (two) times daily. 08/01/13  Yes Historical Provider, MD  aspirin EC 81 MG tablet Take 81 mg by mouth daily.   Yes Historical Provider, MD  Bepotastine Besilate (BEPREVE) 1.5 % SOLN Place 1 drop into both eyes daily.    Yes Historical Provider, MD  bisacodyl (DULCOLAX) 10 MG suppository Place 10 mg rectally as needed for moderate constipation.   Yes Historical Provider, MD  cholecalciferol (VITAMIN D) 1000 UNITS tablet Take 1,000 Units by mouth daily.   Yes Historical Provider, MD  cycloSPORINE (RESTASIS) 0.05 % ophthalmic emulsion Place 1  drop into both eyes 2 (two) times daily.   Yes Historical Provider, MD  desonide (DESOWEN) 0.05 % cream Apply 1 application topically 2 (two) times daily.   Yes Historical Provider, MD  magnesium citrate SOLN Take 1 Bottle by mouth once.   Yes Historical Provider, MD  metoprolol succinate (TOPROL-XL) 50 MG 24 hr tablet Take 50 mg by mouth daily. Take with or immediately following a meal.   Yes Historical Provider, MD  Nutritional Supplements (JUICE PLUS FIBRE PO) Take 4 capsules by mouth daily.   Yes Historical Provider, MD  oxybutynin (DITROPAN-XL) 10 MG 24 hr tablet Take 10 mg by mouth at  bedtime.   Yes Historical Provider, MD  pantoprazole (PROTONIX) 40 MG tablet Take 40 mg by mouth 2 (two) times daily.   Yes Historical Provider, MD  losartan (COZAAR) 100 MG tablet Take 1 tablet (100 mg total) by mouth daily. 02/04/11 02/04/12  Sheila Oats, MD    FAMILY HISTORY: Family History  Problem Relation Age of Onset  . Breast cancer Sister   . Heart disease Father   . Stroke Mother     multiple  . Colon cancer Neg Hx   . Esophageal cancer Neg Hx   . Rectal cancer Neg Hx   . Stomach cancer Neg Hx     SOCIAL HISTORY:  reports that she quit smoking about 46 years ago. Her smoking use included Cigarettes. She smoked 0.00 packs per day. She has never used smokeless tobacco. She reports that she does not drink alcohol or use illicit drugs.  REVIEW OF SYSTEMS:  Constitutional:   No  weight loss, night sweats,  Fevers, chills, fatigue.  HEENT:    No headaches, Difficulty swallowing,Tooth/dental problems,Sore throat,  No sneezing, itching, ear ache, nasal congestion, post nasal drip,   Cardio-vascular: No chest pain,  Orthopnea, PND, swelling in lower extremities, anasarca,         dizziness, palpitations  GI:  No heartburn, indigestion,diarrhea  Resp: No shortness of breath with exertion or at rest.  No excess mucus, no productive cough, No non-productive cough,  No coughing up of blood.No change in color of mucus.No wheezing.No chest wall deformity  Skin:  no rash or lesions.  GU:  no dysuria, change in color of urine, no urgency or frequency.  No flank pain.  Musculoskeletal: No joint pain or swelling.  No decreased range of motion.  No back pain.  Psych: No change in mood or affect. No depression or anxiety.  No memory loss.   PHYSICAL EXAM: Blood pressure 141/59, pulse 89, temperature 98.5 F (36.9 C), temperature source Oral, resp. rate 20, SpO2 96.00%.  General appearance :Awake, alert, not in any distress. Speech Clear. Not toxic Looking HEENT:  Atraumatic and Normocephalic, pupils equally reactive to light and accomodation Neck: supple, no JVD. No cervical lymphadenopathy.  Chest:Good air entry bilaterally, no added sounds  CVS: S1 S2 regular, no murmurs.  Abdomen: Bowel sounds present,Moderately tender in the mid lower abdomen, but abd is soft, there is no distention ore rebound, there is mild  gaurding. Extremities: B/L Lower Ext shows no edema, both legs are warm to touch Neurology: Awake alert, and oriented X 3, CN II-XII intact, Non focal Skin:No Rash Wounds:N/A  LABS ON ADMISSION:   Recent Labs  08/04/13 1635  NA 132*  K 4.2  CL 93*  CO2 20  GLUCOSE 114*  BUN 14  CREATININE 0.71  CALCIUM 9.4    Recent Labs  08/04/13 1635  AST 16  ALT 9  ALKPHOS 116  BILITOT 0.7  PROT 7.7  ALBUMIN 3.6    Recent Labs  08/04/13 1635  LIPASE 14    Recent Labs  08/04/13 1635  WBC 16.3*  NEUTROABS 13.4*  HGB 12.9  HCT 38.5  MCV 89.5  PLT 358   No results found for this basename: CKTOTAL, CKMB, CKMBINDEX, TROPONINI,  in the last 72 hours No results found for this basename: DDIMER,  in the last 72 hours No components found with this basename: POCBNP,    RADIOLOGIC STUDIES ON ADMISSION: Ct Abdomen Pelvis W Contrast  08/04/2013   CLINICAL DATA:  Abdominal pain.  EXAM: CT ABDOMEN AND PELVIS WITH CONTRAST  TECHNIQUE: Multidetector CT imaging of the abdomen and pelvis was performed using the standard protocol following bolus administration of intravenous contrast.  CONTRAST:  55m OMNIPAQUE IOHEXOL 300 MG/ML SOLN, 1061mOMNIPAQUE IOHEXOL 300 MG/ML SOLN  COMPARISON:  CT scan 06/24/2013  FINDINGS: The lung bases are clear. The heart is normal in size. No pericardial effusion. The distal esophagus is grossly normal. A small hiatal hernia is noted.  The liver is unremarkable and stable. The gallbladder is normal. No common bowel duct dilatation. The pancreas is normal. The spleen is normal. The adrenal glands and kidneys are  normal except for a small stable right renal cyst.  The stomach, duodenum, small bowel and terminal ileum are normal.  There is a large amount of stool throughout the colon down to the upper sigmoid area where there is marked inflammation of the sigmoid colon with wall thickening and pericolonic inflammatory change. A small diverticular abscess is suggested between the colon in the uterus. This measures approximately 3 cm. Other smaller intramural abscesses are possible. This inflammatory process may be causing a functional obstruction and the significant constipation. I do not see a discrete mass. There are small adjacent lymph nodes which are likely hyperplastic/inflammatory.  The bladder is unremarkable. The uterus appears to have a thickened endometrium. Recommend followup pelvic ultrasound to exclude endometrial hyperplasia. The ovaries are normal.  The aorta is normal in caliber. The branch vessels are patent. Scattered atherosclerotic calcifications are stable. No mesenteric or retroperitoneal mass or adenopathy. The major venous structures are patent.  The bony structures are intact.  IMPRESSION: 1. Large segment of diverticulitis involving the sigmoid colon likely causing a functional obstruction with fairly significant constipation. Suspect a 3 cm diverticular abscess between the sigmoid colon in the uterus. A followup CT scan after appropriate antibiotic therapy may be helpful to re-evaluate (2-3 weeks). 2. Possible thickened endometrium for age. Pelvic ultrasound may be helpful for further evaluation. 3. Normal appearance of the solid abdominal organs.   Electronically Signed   By: MaKalman Jewels.D.   On: 08/04/2013 19:52     ASSESSMENT AND PLAN: Present on Admission:  . Diverticulitis of large intestine with abscess without bleeding - Admit to medical surgical unit, keep n.p.o., start IV Unasyn. Other supportive care will be provided. Await general surgery input. Suspect, will need a repeat CT  scan of the abdomen in 5-7 days. Follow clinically.  . Constipation - Will place on MiraLax and Senokot, follow clinically.  . Barrett's esophagus - Continue PPI   . HTN (hypertension) - Will hold losartan amlodipine, would change metoprolol to IV. Further adjustment will be needed depending on BP readings.  Further plan will depend as patient's clinical course evolves and further radiologic and laboratory data become available. Patient will be monitored closely.  Above noted plan was discussed with patient/daughter, they were in agreement.   DVT Prophylaxis: Prophylactic  Heparin   Code Status: Full Code  Total time spent for admission equals 45 minutes.  [facility] Pager [phone]  If 7PM-7AM, please contact night-coverage [email or web address] Password [email or web address] 08/04/2013, 9:17 PM  **Disclaimer: This note may have been dictated with voice recognition software. Similar sounding words can inadvertently be transcribed and this note may contain transcription errors which may not have been corrected upon publication of note.**

## 2013-08-05 ENCOUNTER — Inpatient Hospital Stay (HOSPITAL_COMMUNITY): Payer: Medicare Other

## 2013-08-05 ENCOUNTER — Encounter (HOSPITAL_COMMUNITY): Payer: Self-pay | Admitting: *Deleted

## 2013-08-05 DIAGNOSIS — K5900 Constipation, unspecified: Secondary | ICD-10-CM | POA: Diagnosis present

## 2013-08-05 DIAGNOSIS — R9389 Abnormal findings on diagnostic imaging of other specified body structures: Secondary | ICD-10-CM

## 2013-08-05 DIAGNOSIS — D72829 Elevated white blood cell count, unspecified: Secondary | ICD-10-CM

## 2013-08-05 DIAGNOSIS — E871 Hypo-osmolality and hyponatremia: Secondary | ICD-10-CM

## 2013-08-05 LAB — BASIC METABOLIC PANEL
Anion gap: 15 (ref 5–15)
BUN: 12 mg/dL (ref 6–23)
CALCIUM: 8.5 mg/dL (ref 8.4–10.5)
CO2: 22 mEq/L (ref 19–32)
CREATININE: 0.69 mg/dL (ref 0.50–1.10)
Chloride: 100 mEq/L (ref 96–112)
GFR calc non Af Amer: 83 mL/min — ABNORMAL LOW (ref >90)
Glucose, Bld: 91 mg/dL (ref 70–99)
Potassium: 3.8 mEq/L (ref 3.7–5.3)
Sodium: 137 mEq/L (ref 137–147)

## 2013-08-05 LAB — CBC
HCT: 31.6 % — ABNORMAL LOW (ref 36.0–46.0)
Hemoglobin: 10.5 g/dL — ABNORMAL LOW (ref 12.0–15.0)
MCH: 29.7 pg (ref 26.0–34.0)
MCHC: 33.2 g/dL (ref 30.0–36.0)
MCV: 89.5 fL (ref 78.0–100.0)
Platelets: 319 10*3/uL (ref 150–400)
RBC: 3.53 MIL/uL — ABNORMAL LOW (ref 3.87–5.11)
RDW: 13.4 % (ref 11.5–15.5)
WBC: 10.7 10*3/uL — ABNORMAL HIGH (ref 4.0–10.5)

## 2013-08-05 MED ORDER — PIPERACILLIN-TAZOBACTAM 3.375 G IVPB
3.3750 g | Freq: Three times a day (TID) | INTRAVENOUS | Status: DC
  Administered 2013-08-05 – 2013-08-09 (×12): 3.375 g via INTRAVENOUS
  Filled 2013-08-05 (×13): qty 50

## 2013-08-05 NOTE — Progress Notes (Signed)
ANTIBIOTIC CONSULT NOTE - INITIAL  Pharmacy Consult for Zosyn Indication: Intra-abdominal Infection  Allergies  Allergen Reactions  . Gluten Meal     Diarrhea, vomiting that lasts 3-4 hours  . Ace Inhibitors     Coughing  . Ciprofloxacin     Joint pain  . Ibuprofen     Joint pain  . Levofloxacin     Pain in tendons  . Simvastatin Other (See Comments)    Muscle pain    Patient Measurements: Height: 5' (152.4 cm) Weight: 139 lb 5.3 oz (63.2 kg) IBW/kg (Calculated) : 45.5  Vital Signs: Temp: 98.8 F (37.1 C) (07/27 0410) Temp src: Oral (07/27 0410) BP: 132/57 mmHg (07/27 0410) Pulse Rate: 83 (07/27 0410) Intake/Output from previous day: 07/26 0701 - 07/27 0700 In: 50 [P.O.:50] Out: -  Intake/Output from this shift:    Labs:  Recent Labs  08/04/13 1635 08/05/13 0411  WBC 16.3* 10.7*  HGB 12.9 10.5*  PLT 358 319  CREATININE 0.71 0.69   Estimated Creatinine Clearance: 50.5 ml/min (by C-G formula based on Cr of 0.69). No results found for this basename: VANCOTROUGH, VANCOPEAK, VANCORANDOM, GENTTROUGH, GENTPEAK, GENTRANDOM, TOBRATROUGH, TOBRAPEAK, TOBRARND, AMIKACINPEAK, AMIKACINTROU, AMIKACIN,  in the last 72 hours   Microbiology: No results found for this or any previous visit (from the past 720 hour(s)).  Medical History: Past Medical History  Diagnosis Date  . Hypertension   . Celiac disease     per Dr. Fuller Plan, is questionable.   . Diverticulitis     and diverticulosis in sigmoid colon   . Heart murmur   . IBS (irritable bowel syndrome)   . GERD (gastroesophageal reflux disease)   . Erosive esophagitis     with hiatal hernia, Barrett's esophagus 02/2004  . Degenerative joint disease     Medications:  Anti-infectives   Start     Dose/Rate Route Frequency Ordered Stop   08/05/13 1400  piperacillin-tazobactam (ZOSYN) IVPB 3.375 g     3.375 g 12.5 mL/hr over 240 Minutes Intravenous 3 times per day 08/05/13 1203     08/04/13 2245   Ampicillin-Sulbactam (UNASYN) 3 g in sodium chloride 0.9 % 100 mL IVPB  Status:  Discontinued     3 g 100 mL/hr over 60 Minutes Intravenous 3 times per day 08/04/13 2240 08/05/13 1156   08/04/13 2030  piperacillin-tazobactam (ZOSYN) IVPB 3.375 g     3.375 g 100 mL/hr over 30 Minutes Intravenous  Once 08/04/13 2002 08/04/13 2054   08/04/13 2000  piperacillin-tazobactam (ZOSYN) IVPB 3.375 g  Status:  Discontinued     3.375 g 12.5 mL/hr over 240 Minutes Intravenous  Once 08/04/13 1959 08/05/13 0014     Assessment: 73 yo patient admitted with complaints of abdominal pain, low grade fevers, and N/V for several days, who was found to have diverticulitis and a possible diverticular abscess.  Patient was initially started on Unasyn for intra-abdominal infection, and pharmacy has now been consulted to broaden antibiotic therapy to Zosyn.   7/27 unasyn >> 7/27 7/27 zosyn >>  Patient is afebrile, and her WBC count is trending down (16.3 yesterday to 10.7 today). Her renal function remains stable with a CrCl ~ 51.  No cultures have been obtained.   Goal of Therapy:  Appropriate antibiotic dosing  Plan:  Start Zosyn 3.375g IV q 8 hours. Monitor temperature curve, WBC, renal function, clinical progression  Sheliah Mends, PharmD Clinical Pharmacist-Resident Pager: [phone] 08/05/2013 12:04 PM

## 2013-08-05 NOTE — Progress Notes (Signed)
Seen, agree with above.  Repeat CT at some point in next few days./

## 2013-08-05 NOTE — Progress Notes (Signed)
ANTIBIOTIC CONSULT NOTE - INITIAL  Pharmacy Consult for unasyn Indication: Intra-abdominal Infection  Allergies  Allergen Reactions  . Gluten Meal     Diarrhea, vomiting that lasts 3-4 hours  . Ace Inhibitors     Coughing  . Ciprofloxacin     Joint pain  . Ibuprofen     Joint pain  . Levofloxacin     Pain in tendons  . Simvastatin Other (See Comments)    Muscle pain    Patient Measurements: Height: 5' (152.4 cm) Weight: 139 lb 5.3 oz (63.2 kg) IBW/kg (Calculated) : 45.5 Adjusted Body Weight:   Vital Signs: Temp: 98.9 F (37.2 C) (07/26 2223) Temp src: Oral (07/26 2223) BP: 147/57 mmHg (07/26 2223) Pulse Rate: 81 (07/26 2223) Intake/Output from previous day:   Intake/Output from this shift:    Labs:  Recent Labs  08/04/13 1635  WBC 16.3*  HGB 12.9  PLT 358  CREATININE 0.71   Estimated Creatinine Clearance: 50.5 ml/min (by C-G formula based on Cr of 0.71). No results found for this basename: VANCOTROUGH, VANCOPEAK, VANCORANDOM, GENTTROUGH, GENTPEAK, GENTRANDOM, TOBRATROUGH, TOBRAPEAK, TOBRARND, AMIKACINPEAK, AMIKACINTROU, AMIKACIN,  in the last 72 hours   Microbiology: No results found for this or any previous visit (from the past 720 hour(s)).  Medical History: Past Medical History  Diagnosis Date  . Hypertension   . Celiac disease     per Dr. Fuller Plan, is questionable.   . Diverticulitis     and diverticulosis in sigmoid colon   . Heart murmur   . IBS (irritable bowel syndrome)   . GERD (gastroesophageal reflux disease)   . Erosive esophagitis     with hiatal hernia, Barrett's esophagus 02/2004  . Degenerative joint disease     Medications:  Anti-infectives   Start     Dose/Rate Route Frequency Ordered Stop   08/04/13 2245  Ampicillin-Sulbactam (UNASYN) 3 g in sodium chloride 0.9 % 100 mL IVPB     3 g 100 mL/hr over 60 Minutes Intravenous 3 times per day 08/04/13 2240     08/04/13 2030  piperacillin-tazobactam (ZOSYN) IVPB 3.375 g     3.375  g 100 mL/hr over 30 Minutes Intravenous  Once 08/04/13 2002 08/04/13 2054   08/04/13 2000  piperacillin-tazobactam (ZOSYN) IVPB 3.375 g  Status:  Discontinued     3.375 g 12.5 mL/hr over 240 Minutes Intravenous  Once 08/04/13 1959 08/05/13 0014     Assessment: Patient with Intra-abdominal Infection.  First dose of antibiotics already given.    Goal of Therapy:  Unasyn dosed based on patient weight and renal function   Plan:  Follow up culture results Unasyn 3gm iv q8hr  Tyler Deis, Shea Stakes Crowford 08/05/2013,1:44 AM

## 2013-08-05 NOTE — Progress Notes (Signed)
[facility] Progress Note     Subjective: Pt doing much better, but pain still in LLQ and suprapubic.  No N/V.  Having flatus and loose BM's.  Ambulating well in room.  Husband at bedside.    Objective: Vital signs in last 24 hours: Temp:  [98.4 F (36.9 C)-98.9 F (37.2 C)] 98.8 F (37.1 C) (07/27 0410) Pulse Rate:  [70-89] 83 (07/27 0410) Resp:  [18-20] 18 (07/27 0410) BP: (132-148)/(57-79) 132/57 mmHg (07/27 0410) SpO2:  [96 %-100 %] 97 % (07/27 0410) Weight:  [139 lb 5.3 oz (63.2 kg)] 139 lb 5.3 oz (63.2 kg) (07/26 2223) Last BM Date: 08/05/13  Intake/Output from previous day: 07/26 0701 - 07/27 0700 In: 3 [P.O.:50] Out: -  Intake/Output this shift:    PE: Gen:  Alert, NAD, pleasant Abd: Soft, ND, quite tender in the LLQ and suprapubic area, +BS, no HSM   Lab Results:   Recent Labs  08/04/13 1635 08/05/13 0411  WBC 16.3* 10.7*  HGB 12.9 10.5*  HCT 38.5 31.6*  PLT 358 319   BMET  Recent Labs  08/04/13 1635 08/05/13 0411  NA 132* 137  K 4.2 3.8  CL 93* 100  CO2 20 22  GLUCOSE 114* 91  BUN 14 12  CREATININE 0.71 0.69  CALCIUM 9.4 8.5   PT/INR No results found for this basename: LABPROT, INR,  in the last 72 hours CMP     Component Value Date/Time   NA 137 08/05/2013 0411   K 3.8 08/05/2013 0411   CL 100 08/05/2013 0411   CO2 22 08/05/2013 0411   GLUCOSE 91 08/05/2013 0411   BUN 12 08/05/2013 0411   CREATININE 0.69 08/05/2013 0411   CALCIUM 8.5 08/05/2013 0411   PROT 7.7 08/04/2013 1635   ALBUMIN 3.6 08/04/2013 1635   AST 16 08/04/2013 1635   ALT 9 08/04/2013 1635   ALKPHOS 116 08/04/2013 1635   BILITOT 0.7 08/04/2013 1635   GFRNONAA 83* 08/05/2013 0411   GFRAA >90 08/05/2013 0411   Lipase     Component Value Date/Time   LIPASE 14 08/04/2013 1635       Studies/Results: Ct Abdomen Pelvis W Contrast  08/04/2013   CLINICAL DATA:  Abdominal pain.  EXAM: CT ABDOMEN AND PELVIS WITH CONTRAST  TECHNIQUE: Multidetector CT imaging of the  abdomen and pelvis was performed using the standard protocol following bolus administration of intravenous contrast.  CONTRAST:  50m OMNIPAQUE IOHEXOL 300 MG/ML SOLN, 1090mOMNIPAQUE IOHEXOL 300 MG/ML SOLN  COMPARISON:  CT scan 06/24/2013  FINDINGS: The lung bases are clear. The heart is normal in size. No pericardial effusion. The distal esophagus is grossly normal. A small hiatal hernia is noted.  The liver is unremarkable and stable. The gallbladder is normal. No common bowel duct dilatation. The pancreas is normal. The spleen is normal. The adrenal glands and kidneys are normal except for a small stable right renal cyst.  The stomach, duodenum, small bowel and terminal ileum are normal.  There is a large amount of stool throughout the colon down to the upper sigmoid area where there is marked inflammation of the sigmoid colon with wall thickening and pericolonic inflammatory change. A small diverticular abscess is suggested between the colon in the uterus. This measures approximately 3 cm. Other smaller intramural abscesses are possible. This inflammatory process may be causing a functional obstruction and the significant constipation. I do not see a discrete mass. There are small adjacent lymph nodes which are likely hyperplastic/inflammatory.  The bladder is unremarkable. The uterus appears to have a thickened endometrium. Recommend followup pelvic ultrasound to exclude endometrial hyperplasia. The ovaries are normal.  The aorta is normal in caliber. The branch vessels are patent. Scattered atherosclerotic calcifications are stable. No mesenteric or retroperitoneal mass or adenopathy. The major venous structures are patent.  The bony structures are intact.  IMPRESSION: 1. Large segment of diverticulitis involving the sigmoid colon likely causing a functional obstruction with fairly significant constipation. Suspect a 3 cm diverticular abscess between the sigmoid colon in the uterus. A followup CT scan after  appropriate antibiotic therapy may be helpful to re-evaluate (2-3 weeks). 2. Possible thickened endometrium for age. Pelvic ultrasound may be helpful for further evaluation. 3. Normal appearance of the solid abdominal organs.   Electronically Signed   By: Kalman Jewels M.D.   On: 08/04/2013 19:52    Anti-infectives: Anti-infectives   Start     Dose/Rate Route Frequency Ordered Stop   08/04/13 2245  Ampicillin-Sulbactam (UNASYN) 3 g in sodium chloride 0.9 % 100 mL IVPB     3 g 100 mL/hr over 60 Minutes Intravenous 3 times per day 08/04/13 2240     08/04/13 2030  piperacillin-tazobactam (ZOSYN) IVPB 3.375 g     3.375 g 100 mL/hr over 30 Minutes Intravenous  Once 08/04/13 2002 08/04/13 2054   08/04/13 2000  piperacillin-tazobactam (ZOSYN) IVPB 3.375 g  Status:  Discontinued     3.375 g 12.5 mL/hr over 240 Minutes Intravenous  Once 08/04/13 1959 08/05/13 0014       Assessment/Plan Acute diverticulitis with possible early abscess Leukocytosis - down to 10.7 today  Plan: 1.  IVF, pain control, antiemetics, antibiotics (Unasyn - switch to Zosyn for better coverage) 2.  Ambulate and IS 3.  SCD's and heparin 4.  Hopefully this will resolve without surgical intervention, but if no improvement in the next few days may require a hartman's with colostomy or Perc drain if abscess develops 5.  Continue NPO until pain resolved 6.  Pending [place] for endometrial thickening 7.  Repeat CT at some point, usually at 7 days after last CT to check improvement of fluid collection, sooner if patients clinical picture worsens     LOS: 1 day    DORT, Valari Taylor 08/05/2013, 11:55 AM Pager: [phone]

## 2013-08-05 NOTE — Progress Notes (Signed)
Progress Note   [name]:[id number] DOB: 09-01-1940 DOA: 08/04/2013 PCP: Cari Caraway, MD   Brief Narrative:   [name] is an 73 y.o. female with a PMH of hypertension, prior diverticulitis and frequent UTIs who was admitted 08/04/13 with a chief complaint of a seven-day history of abdominal pain and low-grade fevers associated with nausea/vomiting. A CT scan of the abdomen, done on admission, revealed diverticulitis of the sigmoid colon, constipation, and a 3 cm diverticular abscess. Surgery is following the patient.  Assessment/Plan:   Principal Problem:   Diverticulitis of large intestine with abscess without bleeding  Continue bowel rest and IV Unasyn.  Evaluated by Psychologist, sport and exercise. Likely we'll repeat a CT scan in 3-4 days. May require percutaneous drainage or laparotomy with possible colostomy.  Active Problems:   Thickened endometrium incidentally noted on CT  Will order transvaginal ultrasound to further evaluate.    Barrett's esophagus  Continue PPI therapy.    HTN (hypertension)  Norvasc and losartan on hold. Continue IV metoprolol.    Unspecified constipation  Continue MiraLAX and Senokot.    DVT Prophylaxis  Continue subcutaneous heparin.  Code Status: Full. Family Communication: Husband updated at the bedside. Disposition Plan: Home when stable.   IV Access:    Peripheral IV   Procedures and diagnostic studies:    08/04/13: CT of the abdomen and pelvis: Large segment of diverticulitis involving the sigmoid colon, likely causing a functional obstruction with fairly significant constipation. Suspect a 3 cm diverticular abscess between the sigmoid colon and the uterus. Followup CT scan after appropriate antibiotic therapy may be helpful. Possible thickened endometrium for age. Pelvic ultrasound may be helpful. Normal appearance of the solid abdominal organs.   Medical Consultants:    Dr. Earnstine Regal, Surgery.   Other Consultants:     None.   Anti-Infectives:    Unasyn 08/04/13--->  Subjective:   [name] has had some loose stools, and reports that her left lower quadrant abdominal pain is a bit better. No nausea or vomiting.  Objective:    Filed Vitals:   08/04/13 1614 08/04/13 2018 08/04/13 2223 08/05/13 0410  BP: 148/79 141/59 147/57 132/57  Pulse: 70 89 81 83  Temp: 98.4 F (36.9 C) 98.5 F (36.9 C) 98.9 F (37.2 C) 98.8 F (37.1 C)  TempSrc: Oral Oral Oral Oral  Resp: 20  18 18   Height:   5' (1.524 m)   Weight:   63.2 kg (139 lb 5.3 oz)   SpO2: 100% 96% 98% 97%    Intake/Output Summary (Last 24 hours) at 08/05/13 0831 Last data filed at 08/05/13 0417  Gross per 24 hour  Intake     50 ml  Output      0 ml  Net     50 ml    Exam: Gen:  NAD Cardiovascular:  RRR, No M/R/G Respiratory:  Lungs CTAB Gastrointestinal:  Abdomen soft, tender left lower quadrant, + BS Extremities:  No C/E/C   Data Reviewed:    Labs: Basic Metabolic Panel:  Recent Labs Lab 08/04/13 1635 08/05/13 0411  NA 132* 137  K 4.2 3.8  CL 93* 100  CO2 20 22  GLUCOSE 114* 91  BUN 14 12  CREATININE 0.71 0.69  CALCIUM 9.4 8.5   GFR Estimated Creatinine Clearance: 50.5 ml/min (by C-G formula based on Cr of 0.69). Liver Function Tests:  Recent Labs Lab 08/04/13 1635  AST 16  ALT 9  ALKPHOS 116  BILITOT  0.7  PROT 7.7  ALBUMIN 3.6    Recent Labs Lab 08/04/13 1635  LIPASE 14   CBC:  Recent Labs Lab 08/04/13 1635 08/05/13 0411  WBC 16.3* 10.7*  NEUTROABS 13.4*  --   HGB 12.9 10.5*  HCT 38.5 31.6*  MCV 89.5 89.5  PLT 358 319   Microbiology No results found for this or any previous visit (from the past 240 hour(s)).   Medications:   . acidophilus  1 capsule Oral Daily  . ampicillin-sulbactam (UNASYN) IV  3 g Intravenous 3 times per day  . cycloSPORINE  1 drop Both Eyes BID  . heparin  5,000 Units Subcutaneous 3 times per day  . metoprolol  5 mg Intravenous 3 times per day  .  olopatadine  1 drop Both Eyes BID  . pantoprazole (PROTONIX) IV  40 mg Intravenous QHS  . polyethylene glycol  17 g Oral BID  . senna  1 tablet Oral BID   Continuous Infusions: . sodium chloride 125 mL/hr at 08/05/13 6659    Time spent: 35 minutes with > 50% of time discussing current diagnostic test results, clinical impression and plan of care.    LOS: 1 day   [name],[name]  [facility] Pager [phone]. If unable to reach me by pager, please call my cell phone at [phone].  *Please refer to [email or web address], password [email or web address] to get updated schedule on who will round on this patient, as hospitalists switch teams weekly. If 7PM-7AM, please contact night-coverage at [email or web address], password [email or web address] for any overnight needs.  08/05/2013, 8:31 AM    **Disclaimer: This note was dictated with voice recognition software. Similar sounding words can inadvertently be transcribed and this note may contain transcription errors which may not have been corrected upon publication of note.**   Information printed out and reviewed with the patient/family:     In an effort to keep you and your family informed about your hospital stay, I am providing you with this information sheet. If you or your family have any questions, please do not hesitate to have the nursing staff page me to set up a meeting time.  Also note that the hospitalist doctors typically change on Tuesdays or Wednesdays to a different hospitalist doctor.  [name] 08/05/2013 1 (Number of days in the hospital)  Treatment team:  Dr. Jacquelynn Cree, Hospitalist (Internist)  Dr. Armandina Gemma, Surgeon.  Pertinent labs / studies:   08/04/13: CT of the abdomen and pelvis: Large segment of diverticulitis involving the sigmoid colon, likely causing a functional obstruction with fairly significant constipation. Suspect a 3 cm diverticular abscess between the sigmoid colon and the uterus. Followup CT scan after appropriate antibiotic  therapy may be helpful. Possible thickened endometrium for age. Pelvic ultrasound may be helpful. Normal appearance of the solid abdominal organs.  Principle Diagnosis:  Diverticulitis, constipation   Plan for today: Continue antibiotics to treat diverticulitis. The surgeons will continue to follow your clinical course in case surgery is needed. You have an incidentally discovered thick endometrium. A followup pelvic ultrasound is recommended. Continue stool softeners/laxatives.  Anticipated discharge date: Several days, depending on progress.

## 2013-08-06 ENCOUNTER — Encounter (HOSPITAL_COMMUNITY): Payer: Self-pay | Admitting: Internal Medicine

## 2013-08-06 DIAGNOSIS — N840 Polyp of corpus uteri: Secondary | ICD-10-CM | POA: Diagnosis present

## 2013-08-06 DIAGNOSIS — E876 Hypokalemia: Secondary | ICD-10-CM

## 2013-08-06 LAB — BASIC METABOLIC PANEL
Anion gap: 18 — ABNORMAL HIGH (ref 5–15)
BUN: 10 mg/dL (ref 6–23)
CHLORIDE: 101 meq/L (ref 96–112)
CO2: 19 mEq/L (ref 19–32)
CREATININE: 0.61 mg/dL (ref 0.50–1.10)
Calcium: 8.4 mg/dL (ref 8.4–10.5)
GFR calc non Af Amer: 87 mL/min — ABNORMAL LOW (ref >90)
Glucose, Bld: 73 mg/dL (ref 70–99)
Potassium: 3.5 mEq/L — ABNORMAL LOW (ref 3.7–5.3)
SODIUM: 138 meq/L (ref 137–147)

## 2013-08-06 LAB — CBC
HCT: 31.8 % — ABNORMAL LOW (ref 36.0–46.0)
Hemoglobin: 10.5 g/dL — ABNORMAL LOW (ref 12.0–15.0)
MCH: 29.4 pg (ref 26.0–34.0)
MCHC: 33 g/dL (ref 30.0–36.0)
MCV: 89.1 fL (ref 78.0–100.0)
PLATELETS: 307 10*3/uL (ref 150–400)
RBC: 3.57 MIL/uL — ABNORMAL LOW (ref 3.87–5.11)
RDW: 13.1 % (ref 11.5–15.5)
WBC: 9.1 10*3/uL (ref 4.0–10.5)

## 2013-08-06 MED ORDER — POTASSIUM CHLORIDE IN NACL 20-0.9 MEQ/L-% IV SOLN
INTRAVENOUS | Status: DC
  Administered 2013-08-06 – 2013-08-08 (×5): via INTRAVENOUS
  Filled 2013-08-06 (×9): qty 1000

## 2013-08-06 NOTE — Progress Notes (Signed)
Seen, agree with above.  Significant clinical improvement, but still sl tender and sl distended.   May repeat CT in next day or two if no rapid improvement.

## 2013-08-06 NOTE — Progress Notes (Signed)
[facility] Progress Note     Subjective: Pt's pain continues to improve, but still painful with palpation.  No N/V, Having loose BM's and flatus.  Abdomen feels distended to her.  Walking some, but feels very weak.    Objective: Vital signs in last 24 hours: Temp:  [98.2 F (36.8 C)-98.5 F (36.9 C)] 98.5 F (36.9 C) (07/28 0530) Pulse Rate:  [79-85] 81 (07/28 0530) Resp:  [16-18] 16 (07/28 0530) BP: (137-149)/(54-92) 146/92 mmHg (07/28 0530) SpO2:  [96 %-100 %] 98 % (07/28 0530) Last BM Date: 08/05/13  Intake/Output from previous day: 07/27 0701 - 07/28 0700 In: 3334.2 [I.V.:3234.2; IV Piggyback:100] Out: 2 [Urine:2] Intake/Output this shift:    PE: Gen:  Alert, NAD, pleasant Abd: Soft, ND, less tender in the LLQ and suprapubic area, +BS, no HSM   Lab Results:   Recent Labs  08/05/13 0411 08/06/13 0728  WBC 10.7* 9.1  HGB 10.5* 10.5*  HCT 31.6* 31.8*  PLT 319 307   BMET  Recent Labs  08/05/13 0411 08/06/13 0728  NA 137 138  K 3.8 3.5*  CL 100 101  CO2 22 19  GLUCOSE 91 73  BUN 12 10  CREATININE 0.69 0.61  CALCIUM 8.5 8.4   PT/INR No results found for this basename: LABPROT, INR,  in the last 72 hours CMP     Component Value Date/Time   NA 138 08/06/2013 0728   K 3.5* 08/06/2013 0728   CL 101 08/06/2013 0728   CO2 19 08/06/2013 0728   GLUCOSE 73 08/06/2013 0728   BUN 10 08/06/2013 0728   CREATININE 0.61 08/06/2013 0728   CALCIUM 8.4 08/06/2013 0728   PROT 7.7 08/04/2013 1635   ALBUMIN 3.6 08/04/2013 1635   AST 16 08/04/2013 1635   ALT 9 08/04/2013 1635   ALKPHOS 116 08/04/2013 1635   BILITOT 0.7 08/04/2013 1635   GFRNONAA 87* 08/06/2013 0728   GFRAA >90 08/06/2013 0728   Lipase     Component Value Date/Time   LIPASE 14 08/04/2013 1635       Studies/Results: US Transvaginal Non-ob  08/05/2013   CLINICAL DATA:  Endometrial thickening on CT.  EXAM: TRANSVAGINAL ULTRASOUND OF PELVIS; US PELVIS COMPLETE  TECHNIQUE: Transvaginal  ultrasound examination of the pelvis was performed including evaluation of the uterus, ovaries, adnexal regions, and pelvic cul-de-sac.  COMPARISON:  CT 08/05/1998 15.  FINDINGS: Uterus  Measurements: 7.3 x  3.0 x 3.7 cm.  No fibroids identified.  Endometrium  Thickness: 4.3 mm. 0.4 x 0.8 x 0.6 cm soft tissue density within the endometrial canal, possibly a polypoid lesion. Small amount of fluid noted in the endometrial canal.  Right ovary  Not visualized.  Left ovary  Not visualized.  Other findings:  No tear  IMPRESSION: 8 mm polypoid soft tissue density noted within the endometrial canal. A focal endometrial lesion is suspected. Consider sonohysterogram for further evaluation, prior to hysteroscopy or endometrial biopsy.   Electronically Signed   By: Marcello Moores  Register   On: 08/05/2013 16:22   US Pelvis Complete  08/05/2013   CLINICAL DATA:  Endometrial thickening on CT.  EXAM: TRANSVAGINAL ULTRASOUND OF PELVIS; US PELVIS COMPLETE  TECHNIQUE: Transvaginal ultrasound examination of the pelvis was performed including evaluation of the uterus, ovaries, adnexal regions, and pelvic cul-de-sac.  COMPARISON:  CT 08/05/1998 15.  FINDINGS: Uterus  Measurements: 7.3 x  3.0 x 3.7 cm.  No fibroids identified.  Endometrium  Thickness: 4.3 mm. 0.4 x 0.8 x 0.6 cm  soft tissue density within the endometrial canal, possibly a polypoid lesion. Small amount of fluid noted in the endometrial canal.  Right ovary  Not visualized.  Left ovary  Not visualized.  Other findings:  No tear  IMPRESSION: 8 mm polypoid soft tissue density noted within the endometrial canal. A focal endometrial lesion is suspected. Consider sonohysterogram for further evaluation, prior to hysteroscopy or endometrial biopsy.   Electronically Signed   By: Marcello Moores  Register   On: 08/05/2013 16:22   Ct Abdomen Pelvis W Contrast  08/04/2013   CLINICAL DATA:  Abdominal pain.  EXAM: CT ABDOMEN AND PELVIS WITH CONTRAST  TECHNIQUE: Multidetector CT imaging of the  abdomen and pelvis was performed using the standard protocol following bolus administration of intravenous contrast.  CONTRAST:  42m OMNIPAQUE IOHEXOL 300 MG/ML SOLN, 1083mOMNIPAQUE IOHEXOL 300 MG/ML SOLN  COMPARISON:  CT scan 06/24/2013  FINDINGS: The lung bases are clear. The heart is normal in size. No pericardial effusion. The distal esophagus is grossly normal. A small hiatal hernia is noted.  The liver is unremarkable and stable. The gallbladder is normal. No common bowel duct dilatation. The pancreas is normal. The spleen is normal. The adrenal glands and kidneys are normal except for a small stable right renal cyst.  The stomach, duodenum, small bowel and terminal ileum are normal.  There is a large amount of stool throughout the colon down to the upper sigmoid area where there is marked inflammation of the sigmoid colon with wall thickening and pericolonic inflammatory change. A small diverticular abscess is suggested between the colon in the uterus. This measures approximately 3 cm. Other smaller intramural abscesses are possible. This inflammatory process may be causing a functional obstruction and the significant constipation. I do not see a discrete mass. There are small adjacent lymph nodes which are likely hyperplastic/inflammatory.  The bladder is unremarkable. The uterus appears to have a thickened endometrium. Recommend followup pelvic ultrasound to exclude endometrial hyperplasia. The ovaries are normal.  The aorta is normal in caliber. The branch vessels are patent. Scattered atherosclerotic calcifications are stable. No mesenteric or retroperitoneal mass or adenopathy. The major venous structures are patent.  The bony structures are intact.  IMPRESSION: 1. Large segment of diverticulitis involving the sigmoid colon likely causing a functional obstruction with fairly significant constipation. Suspect a 3 cm diverticular abscess between the sigmoid colon in the uterus. A followup CT scan after  appropriate antibiotic therapy may be helpful to re-evaluate (2-3 weeks). 2. Possible thickened endometrium for age. Pelvic ultrasound may be helpful for further evaluation. 3. Normal appearance of the solid abdominal organs.   Electronically Signed   By: MaKalman Jewels.D.   On: 08/04/2013 19:52    Anti-infectives: Anti-infectives   Start     Dose/Rate Route Frequency Ordered Stop   08/05/13 1400  piperacillin-tazobactam (ZOSYN) IVPB 3.375 g     3.375 g 12.5 mL/hr over 240 Minutes Intravenous 3 times per day 08/05/13 1203     08/04/13 2245  Ampicillin-Sulbactam (UNASYN) 3 g in sodium chloride 0.9 % 100 mL IVPB  Status:  Discontinued     3 g 100 mL/hr over 60 Minutes Intravenous 3 times per day 08/04/13 2240 08/05/13 1156   08/04/13 2030  piperacillin-tazobactam (ZOSYN) IVPB 3.375 g     3.375 g 100 mL/hr over 30 Minutes Intravenous  Once 08/04/13 2002 08/04/13 2054   08/04/13 2000  piperacillin-tazobactam (ZOSYN) IVPB 3.375 g  Status:  Discontinued     3.375 g 12.5  mL/hr over 240 Minutes Intravenous  Once 08/04/13 1959 08/05/13 0014       Assessment/Plan Acute diverticulitis with possible early abscess  Leukocytosis - down to 9.1 today   Plan:  1. IVF, pain control, antiemetics, antibiotics (On Zosyn Day #2)  2. Ambulate and IS  3. SCD's and heparin  4. Hopefully this will resolve without surgical intervention, but if no improvement in the next few days may require a hartman's with colostomy or Perc drain if abscess develops  5. Continue NPO until pain resolved  6. Repeat CT at some point, usually at 7 days after last CT to check improvement of fluid collection, sooner if patients clinical picture worsens      LOS: 2 days    DORT, [facility] 08/06/2013, 8:42 AM Pager: [phone]

## 2013-08-06 NOTE — Plan of Care (Signed)
Problem: Phase II Progression Outcomes Goal: Progress activity as tolerated unless otherwise ordered Outcome: Completed/Met Date Met:  08/06/13 Patient ambulating in the hallway without any difficulty.       

## 2013-08-06 NOTE — Progress Notes (Signed)
Progress Note   [name] EAV:[id number] DOB: Feb 11, 1940 DOA: 08/04/2013 PCP: Gweneth Dimitri, MD   Brief Narrative:   [name] is an 73 y.o. female with a PMH of hypertension, prior diverticulitis and frequent UTIs who was admitted 08/04/13 with a chief complaint of a seven-day history of abdominal pain and low-grade fevers associated with nausea/vomiting. A CT scan of the abdomen, done on admission, revealed diverticulitis of the sigmoid colon, constipation, and a 3 cm diverticular abscess. Surgery is following the patient.  Assessment/Plan:   Principal Problem:   Diverticulitis of large intestine with abscess without bleeding  Continue bowel rest, IV fluids, pain medications, anti-emetics and IV Zosyn.  Evaluated by Careers adviser. Likely we'll repeat a CT scan in 3-4 days. May require percutaneous drainage or laparotomy with possible colostomy.  Active Problems:   Hypokalemia  Add potassium to IV fluids.    Thickened endometrium incidentally noted on CT  Polypoid lesion noted. Will need outpatient followup with OB/GYN for hysteroscopy/endometrial biopsy.    Barrett's esophagus  Continue PPI therapy.    HTN (hypertension)  Norvasc and losartan on hold. Continue IV metoprolol.    Unspecified constipation  Continue MiraLAX and Senokot.    DVT Prophylaxis  Continue subcutaneous heparin.  Code Status: Full. Family Communication: Husband updated at the bedside. Disposition Plan: Home when stable.   IV Access:    Peripheral IV   Procedures and diagnostic studies:    08/04/13: CT of the abdomen and pelvis: Large segment of diverticulitis involving the sigmoid colon, likely causing a functional obstruction with fairly significant constipation. Suspect a 3 cm diverticular abscess between the sigmoid colon and the uterus. Followup CT scan after appropriate antibiotic therapy may be helpful. Possible thickened endometrium for age. Pelvic ultrasound may be helpful.  Normal appearance of the solid abdominal organs.  08/05/13: Pelvic/transvaginal ultrasound: 8 mm polypoid soft tissue density noted within the endometrial canal. A focal endometrial lesion suspected. Consider sonohysterogram for further evaluation, prior to hysteroscopy or endometrial biopsy.   Medical Consultants:    Dr. Velora Heckler, Surgery.   Other Consultants:    None.   Anti-Infectives:    Unasyn 08/04/13---> 08/05/13  Zosyn 08/05/13 --->  Subjective:   [name] continues to report some loose stools. Left lower quadrant tenderness has improved. Had one episode of nausea/vomiting after taking oral medications and MiraLAX this morning.  Objective:    Filed Vitals:   08/05/13 0410 08/05/13 1352 08/05/13 1935 08/06/13 0530  BP: 132/57 137/54 149/64 146/92  Pulse: 83 79 85 81  Temp: 98.8 F (37.1 C) 98.2 F (36.8 C) 98.3 F (36.8 C) 98.5 F (36.9 C)  TempSrc: Oral Oral Oral Oral  Resp: 18 18 16 16   Height:      Weight:      SpO2: 97% 100% 96% 98%    Intake/Output Summary (Last 24 hours) at 08/06/13 0830 Last data filed at 08/06/13 0400  Gross per 24 hour  Intake 3334.16 ml  Output      2 ml  Net 3332.16 ml    Exam: Gen:  NAD Cardiovascular:  RRR, No M/R/G Respiratory:  Lungs CTAB Gastrointestinal:  Abdomen soft, less tender left lower quadrant, + BS Extremities:  No C/E/C   Data Reviewed:    Labs: Basic Metabolic Panel:  Recent Labs Lab 08/04/13 1635 08/05/13 0411 08/06/13 0728  NA 132* 137 138  K 4.2 3.8 3.5*  CL 93* 100 101  CO2 20 22 19  GLUCOSE 114* 91 73  BUN 14 12 10   CREATININE 0.71 0.69 0.61  CALCIUM 9.4 8.5 8.4   GFR Estimated Creatinine Clearance: 50.5 ml/min (by C-G formula based on Cr of 0.61). Liver Function Tests:  Recent Labs Lab 08/04/13 1635  AST 16  ALT 9  ALKPHOS 116  BILITOT 0.7  PROT 7.7  ALBUMIN 3.6    Recent Labs Lab 08/04/13 1635  LIPASE 14   CBC:  Recent Labs Lab 08/04/13 1635  08/05/13 0411 08/06/13 0728  WBC 16.3* 10.7* 9.1  NEUTROABS 13.4*  --   --   HGB 12.9 10.5* 10.5*  HCT 38.5 31.6* 31.8*  MCV 89.5 89.5 89.1  PLT 358 319 307   Microbiology No results found for this or any previous visit (from the past 240 hour(s)).   Medications:   . acidophilus  1 capsule Oral Daily  . cycloSPORINE  1 drop Both Eyes BID  . heparin  5,000 Units Subcutaneous 3 times per day  . metoprolol  5 mg Intravenous 3 times per day  . olopatadine  1 drop Both Eyes BID  . pantoprazole (PROTONIX) IV  40 mg Intravenous QHS  . piperacillin-tazobactam (ZOSYN)  IV  3.375 g Intravenous 3 times per day  . polyethylene glycol  17 g Oral BID  . senna  1 tablet Oral BID   Continuous Infusions: . sodium chloride 100 mL/hr at 08/06/13 0456    Time spent: 35 minutes with > 50% of time discussing current diagnostic test results, clinical impression and plan of care.    LOS: 2 days   Eyob Godlewski  [facility] Pager [phone]. If unable to reach me by pager, please call my cell phone at [phone].  *Please refer to [email or web address], password [email or web address] to get updated schedule on who will round on this patient, as hospitalists switch teams weekly. If 7PM-7AM, please contact night-coverage at [email or web address], password [email or web address] for any overnight needs.  08/06/2013, 8:30 AM    **Disclaimer: This note was dictated with voice recognition software. Similar sounding words can inadvertently be transcribed and this note may contain transcription errors which may not have been corrected upon publication of note.**   Information printed out and reviewed with the patient/family:     In an effort to keep you and your family informed about your hospital stay, I am providing you with this information sheet. If you or your family have any questions, please do not hesitate to have the nursing staff page me to set up a meeting time.  Also note that the hospitalist doctors typically change on Tuesdays or  Wednesdays to a different hospitalist doctor.  [name] 08/06/2013 2 (Number of days in the hospital)  Treatment team:  Dr. Hillery Aldo, Hospitalist (Internist)  Dr. Darnell Level, Surgeon.  Pertinent labs / studies:   08/04/13: CT of the abdomen and pelvis: Large segment of diverticulitis involving the sigmoid colon, likely causing a functional obstruction with fairly significant constipation. Suspect a 3 cm diverticular abscess between the sigmoid colon and the uterus. Followup CT scan after appropriate antibiotic therapy may be helpful. Possible thickened endometrium for age. Pelvic ultrasound may be helpful. Normal appearance of the solid abdominal organs.  08/05/13: Pelvic ultrasound: Polyp in uterus. Recommend outpatient followup with an OB/GYN for further evaluation.  Principle Diagnosis:  Diverticulitis, constipation, uterine polyp   Plan for today: Continue antibiotics to treat diverticulitis. The surgeons will continue to follow your clinical course in case surgery is needed. He will need further outpatient  followup with an OB/GYN to evaluate the polyp seen in your uterus.  Anticipated discharge date: Several days, depending on progress.

## 2013-08-07 LAB — BASIC METABOLIC PANEL
ANION GAP: 19 — AB (ref 5–15)
BUN: 7 mg/dL (ref 6–23)
CO2: 17 mEq/L — ABNORMAL LOW (ref 19–32)
Calcium: 8.9 mg/dL (ref 8.4–10.5)
Chloride: 101 mEq/L (ref 96–112)
Creatinine, Ser: 0.59 mg/dL (ref 0.50–1.10)
GFR, EST NON AFRICAN AMERICAN: 87 mL/min — AB (ref >90)
Glucose, Bld: 70 mg/dL (ref 70–99)
Potassium: 4 mEq/L (ref 3.7–5.3)
Sodium: 137 mEq/L (ref 137–147)

## 2013-08-07 LAB — CBC
HCT: 31.4 % — ABNORMAL LOW (ref 36.0–46.0)
Hemoglobin: 10.5 g/dL — ABNORMAL LOW (ref 12.0–15.0)
MCH: 29.7 pg (ref 26.0–34.0)
MCHC: 33.4 g/dL (ref 30.0–36.0)
MCV: 89 fL (ref 78.0–100.0)
Platelets: 334 10*3/uL (ref 150–400)
RBC: 3.53 MIL/uL — ABNORMAL LOW (ref 3.87–5.11)
RDW: 13.1 % (ref 11.5–15.5)
WBC: 5.2 10*3/uL (ref 4.0–10.5)

## 2013-08-07 MED ORDER — POLYETHYLENE GLYCOL 3350 17 G PO PACK
17.0000 g | PACK | Freq: Every day | ORAL | Status: DC | PRN
  Filled 2013-08-07: qty 1

## 2013-08-07 NOTE — Progress Notes (Signed)
Patient ID: [name], female   DOB: 1940/11/02, 73 y.o.   MRN: [id number]   Subjective: Less pain.  Continues to have diarrhea, voiding frequently.  No n/v.    Objective:  Vital signs:  Filed Vitals:   08/06/13 0530 08/06/13 1400 08/06/13 2100 08/07/13 0640  BP: 146/92 140/80 172/61 150/54  Pulse: 81 80 81 67  Temp: 98.5 F (36.9 C) 98.6 F (37 C) 97.9 F (36.6 C) 97.7 F (36.5 C)  TempSrc: Oral Oral Oral Oral  Resp: 16 18 20 20   Height:      Weight:      SpO2: 98% 98% 100% 100%    Last BM Date: 08/07/13  Intake/Output   Yesterday:  07/28 0701 - 07/29 0700 In: 2300 [P.O.:50; I.V.:2200; IV Piggyback:50] Out: -  This shift:    I/O last 3 completed shifts: In: 5684.2 [P.O.:50; I.V.:5434.2; IV SWNIOEVOJ:500] Out: 2 [Urine:2]    Physical Exam: General: Pt awake/alert/oriented x4 in no acute distress Abdomen: Soft.  Nondistended. TTP to pelvic region.  No evidence of peritonitis.  No incarcerated hernias.   Problem List:   Principal Problem:   Diverticulitis of large intestine with abscess without bleeding Active Problems:   Barrett's esophagus   Hypokalemia   HTN (hypertension)   Unspecified constipation   Thickened endometrium incidentally noted on CT   Uterine polyp    Results:   Labs: Results for orders placed during the hospital encounter of 08/04/13 (from the past 48 hour(s))  CBC     Status: Abnormal   Collection Time    08/06/13  7:28 AM      Result Value Ref Range   WBC 9.1  4.0 - 10.5 K/uL   RBC 3.57 (*) 3.87 - 5.11 MIL/uL   Hemoglobin 10.5 (*) 12.0 - 15.0 g/dL   HCT 31.8 (*) 36.0 - 46.0 %   MCV 89.1  78.0 - 100.0 fL   MCH 29.4  26.0 - 34.0 pg   MCHC 33.0  30.0 - 36.0 g/dL   RDW 13.1  11.5 - 15.5 %   Platelets 307  150 - 400 K/uL  BASIC METABOLIC PANEL     Status: Abnormal   Collection Time    08/06/13  7:28 AM      Result Value Ref Range   Sodium 138  137 - 147 mEq/L   Potassium 3.5 (*) 3.7 - 5.3 mEq/L   Chloride 101  96 - 112  mEq/L   CO2 19  19 - 32 mEq/L   Glucose, Bld 73  70 - 99 mg/dL   BUN 10  6 - 23 mg/dL   Creatinine, Ser 0.61  0.50 - 1.10 mg/dL   Calcium 8.4  8.4 - 10.5 mg/dL   GFR calc non Af Amer 87 (*) >90 mL/min   GFR calc Af Amer >90  >90 mL/min   Comment: (NOTE)     The eGFR has been calculated using the CKD EPI equation.     This calculation has not been validated in all clinical situations.     eGFR's persistently <90 mL/min signify possible Chronic Kidney     Disease.   Anion gap 18 (*) 5 - 15  CBC     Status: Abnormal   Collection Time    08/07/13  4:18 AM      Result Value Ref Range   WBC 5.2  4.0 - 10.5 K/uL   RBC 3.53 (*) 3.87 - 5.11 MIL/uL   Hemoglobin 10.5 (*)  12.0 - 15.0 g/dL   HCT 31.4 (*) 36.0 - 46.0 %   MCV 89.0  78.0 - 100.0 fL   MCH 29.7  26.0 - 34.0 pg   MCHC 33.4  30.0 - 36.0 g/dL   RDW 13.1  11.5 - 15.5 %   Platelets 334  150 - 400 K/uL  BASIC METABOLIC PANEL     Status: Abnormal   Collection Time    08/07/13  4:18 AM      Result Value Ref Range   Sodium 137  137 - 147 mEq/L   Potassium 4.0  3.7 - 5.3 mEq/L   Chloride 101  96 - 112 mEq/L   CO2 17 (*) 19 - 32 mEq/L   Glucose, Bld 70  70 - 99 mg/dL   BUN 7  6 - 23 mg/dL   Creatinine, Ser 0.59  0.50 - 1.10 mg/dL   Calcium 8.9  8.4 - 10.5 mg/dL   GFR calc non Af Amer 87 (*) >90 mL/min   GFR calc Af Amer >90  >90 mL/min   Comment: (NOTE)     The eGFR has been calculated using the CKD EPI equation.     This calculation has not been validated in all clinical situations.     eGFR's persistently <90 mL/min signify possible Chronic Kidney     Disease.   Anion gap 19 (*) 5 - 15    Imaging / Studies: US Transvaginal Non-ob  28-Aug-2013   CLINICAL DATA:  Endometrial thickening on CT.  EXAM: TRANSVAGINAL ULTRASOUND OF PELVIS; US PELVIS COMPLETE  TECHNIQUE: Transvaginal ultrasound examination of the pelvis was performed including evaluation of the uterus, ovaries, adnexal regions, and pelvic cul-de-sac.  COMPARISON:  CT  08/05/1998 15.  FINDINGS: Uterus  Measurements: 7.3 x  3.0 x 3.7 cm.  No fibroids identified.  Endometrium  Thickness: 4.3 mm. 0.4 x 0.8 x 0.6 cm soft tissue density within the endometrial canal, possibly a polypoid lesion. Small amount of fluid noted in the endometrial canal.  Right ovary  Not visualized.  Left ovary  Not visualized.  Other findings:  No tear  IMPRESSION: 8 mm polypoid soft tissue density noted within the endometrial canal. A focal endometrial lesion is suspected. Consider sonohysterogram for further evaluation, prior to hysteroscopy or endometrial biopsy.   Electronically Signed   By: Marcello Moores  Register   On: Aug 28, 2013 16:22   US Pelvis Complete  Aug 28, 2013   CLINICAL DATA:  Endometrial thickening on CT.  EXAM: TRANSVAGINAL ULTRASOUND OF PELVIS; US PELVIS COMPLETE  TECHNIQUE: Transvaginal ultrasound examination of the pelvis was performed including evaluation of the uterus, ovaries, adnexal regions, and pelvic cul-de-sac.  COMPARISON:  CT 08/05/1998 15.  FINDINGS: Uterus  Measurements: 7.3 x  3.0 x 3.7 cm.  No fibroids identified.  Endometrium  Thickness: 4.3 mm. 0.4 x 0.8 x 0.6 cm soft tissue density within the endometrial canal, possibly a polypoid lesion. Small amount of fluid noted in the endometrial canal.  Right ovary  Not visualized.  Left ovary  Not visualized.  Other findings:  No tear  IMPRESSION: 8 mm polypoid soft tissue density noted within the endometrial canal. A focal endometrial lesion is suspected. Consider sonohysterogram for further evaluation, prior to hysteroscopy or endometrial biopsy.   Electronically Signed   By: Marcello Moores  Register   On: 2013/08/28 16:22    Scheduled Meds: . acidophilus  1 capsule Oral Daily  . cycloSPORINE  1 drop Both Eyes BID  . heparin  5,000 Units Subcutaneous  3 times per day  . metoprolol  5 mg Intravenous 3 times per day  . pantoprazole (PROTONIX) IV  40 mg Intravenous QHS  . piperacillin-tazobactam (ZOSYN)  IV  3.375 g Intravenous 3 times  per day  . polyethylene glycol  17 g Oral BID  . senna  1 tablet Oral BID   Continuous Infusions: . 0.9 % NaCl with KCl 20 mEq / L 100 mL/hr at 08/06/13 1210   PRN Meds:.acetaminophen, acetaminophen, albuterol, guaiFENesin-dextromethorphan, hydrALAZINE, morphine injection, ondansetron (ZOFRAN) IV, ondansetron, oxyCODONE   Antibiotics: Anti-infectives   Start     Dose/Rate Route Frequency Ordered Stop   08/05/13 1400  piperacillin-tazobactam (ZOSYN) IVPB 3.375 g     3.375 g 12.5 mL/hr over 240 Minutes Intravenous 3 times per day 08/05/13 1203     08/04/13 2245  Ampicillin-Sulbactam (UNASYN) 3 g in sodium chloride 0.9 % 100 mL IVPB  Status:  Discontinued     3 g 100 mL/hr over 60 Minutes Intravenous 3 times per day 08/04/13 2240 08/05/13 1156   08/04/13 2030  piperacillin-tazobactam (ZOSYN) IVPB 3.375 g     3.375 g 100 mL/hr over 30 Minutes Intravenous  Once 08/04/13 2002 08/04/13 2054   08/04/13 2000  piperacillin-tazobactam (ZOSYN) IVPB 3.375 g  Status:  Discontinued     3.375 g 12.5 mL/hr over 240 Minutes Intravenous  Once 08/04/13 1959 08/05/13 0014      Assessment/Plan  Acute diverticulitis with possible early abscess  Leukocytosis - down to 5.2 today  -continue with conservative management.  She is slowly improving.  Leave her NPO for now.  Hold off on CT for now.  Hopefully this will resolve without surgical intervention, but if no improvement in the next few days may require a hartman's with colostomy or Perc drain if abscess develops  -IVF, pain control, antiemetics, antibiotics (On Zosyn Day #3)  -Ambulate and IS  -will follow.    Erby Pian, [facility] Surgery Pager [phone] Office [phone]  08/07/2013 8:15 AM

## 2013-08-07 NOTE — Progress Notes (Signed)
Progress Note   KLOEY CAZAREZ ZOX:[id number] DOB: [birth year] DOA: 08/04/2013 PCP: Cari Caraway, MD   Brief Narrative:   [name] is an 73 y.o. female with a PMH of hypertension, prior diverticulitis and frequent UTIs who was admitted 08/04/13 with a chief complaint of a seven-day history of abdominal pain and low-grade fevers associated with nausea/vomiting. A CT scan of the abdomen, done on admission, revealed diverticulitis of the sigmoid colon, constipation, and a 3 cm diverticular abscess. Surgery is following the patient.  Assessment/Plan:   Principal Problem:   Diverticulitis of large intestine with abscess without bleeding  Continue bowel rest, IV fluids, pain medications, anti-emetics and IV Zosyn.  Evaluated by Psychologist, sport and exercise. Likely we'll repeat a CT scan in 3-4 days. May require percutaneous drainage or laparotomy with possible colostomy.  Active Problems:   Hypokalemia  Corrected with potassium added to IV fluids.    Thickened endometrium incidentally noted on CT  Polypoid lesion noted. Will need outpatient followup with OB/GYN for hysteroscopy/endometrial biopsy.    Barrett's esophagus  Continue PPI therapy.    HTN (hypertension)  Norvasc and losartan on hold. Continue IV metoprolol.    Unspecified constipation  Stop MiraLAX and Senokot given frequent loose stools. Can give MiraLAX when necessary.    DVT Prophylaxis  Continue subcutaneous heparin.  Code Status: Full. Family Communication: Husband updated at the bedside. Disposition Plan: Home when stable.   IV Access:    Peripheral IV   Procedures and diagnostic studies:    08/04/13: CT of the abdomen and pelvis: Large segment of diverticulitis involving the sigmoid colon, likely causing a functional obstruction with fairly significant constipation. Suspect a 3 cm diverticular abscess between the sigmoid colon and the uterus. Followup CT scan after appropriate antibiotic therapy may be helpful.  Possible thickened endometrium for age. Pelvic ultrasound may be helpful. Normal appearance of the solid abdominal organs.  08/05/13: Pelvic/transvaginal ultrasound: 8 mm polypoid soft tissue density noted within the endometrial canal. A focal endometrial lesion suspected. Consider sonohysterogram for further evaluation, prior to hysteroscopy or endometrial biopsy.   Medical Consultants:    Dr. Earnstine Regal, Surgery.   Other Consultants:    None.   Anti-Infectives:    Unasyn 08/04/13---> 08/05/13  Zosyn 08/05/13 --->  Subjective:   SHARLEE RUFINO continues to report having loose stools. Left lower quadrant tenderness has improved. Had one episode of nausea/vomiting after taking oral medications and MiraLAX yesterday, but none subsequent. Anxious to have diet advanced.  Objective:    Filed Vitals:   08/06/13 0530 08/06/13 1400 08/06/13 2100 08/07/13 0640  BP: 146/92 140/80 172/61 150/54  Pulse: 81 80 81 67  Temp: 98.5 F (36.9 C) 98.6 F (37 C) 97.9 F (36.6 C) 97.7 F (36.5 C)  TempSrc: Oral Oral Oral Oral  Resp: 16 18 20 20   Height:      Weight:      SpO2: 98% 98% 100% 100%    Intake/Output Summary (Last 24 hours) at 08/07/13 0757 Last data filed at 08/07/13 0645  Gross per 24 hour  Intake 1483.33 ml  Output      0 ml  Net 1483.33 ml    Exam: Gen:  NAD Cardiovascular:  RRR, No M/R/G Respiratory:  Lungs CTAB Gastrointestinal:  Abdomen soft, less tender left lower quadrant, + BS Extremities:  No C/E/C   Data Reviewed:    Labs: Basic Metabolic Panel:  Recent Labs Lab 08/04/13 1635 08/05/13 0411 08/06/13 8119 08/07/13 1478  NA 132* 137 138 137  K 4.2 3.8 3.5* 4.0  CL 93* 100 101 101  CO2 20 22 19  17*  GLUCOSE 114* 91 73 70  BUN 14 12 10 7   CREATININE 0.71 0.69 0.61 0.59  CALCIUM 9.4 8.5 8.4 8.9   GFR Estimated Creatinine Clearance: 50.5 ml/min (by C-G formula based on Cr of 0.59). Liver Function Tests:  Recent Labs Lab 08/04/13 1635    AST 16  ALT 9  ALKPHOS 116  BILITOT 0.7  PROT 7.7  ALBUMIN 3.6    Recent Labs Lab 08/04/13 1635  LIPASE 14   CBC:  Recent Labs Lab 08/04/13 1635 08/05/13 0411 08/06/13 0728 08/07/13 0418  WBC 16.3* 10.7* 9.1 5.2  NEUTROABS 13.4*  --   --   --   HGB 12.9 10.5* 10.5* 10.5*  HCT 38.5 31.6* 31.8* 31.4*  MCV 89.5 89.5 89.1 89.0  PLT 358 319 307 334   Microbiology No results found for this or any previous visit (from the past 240 hour(s)).   Medications:   . acidophilus  1 capsule Oral Daily  . cycloSPORINE  1 drop Both Eyes BID  . heparin  5,000 Units Subcutaneous 3 times per day  . metoprolol  5 mg Intravenous 3 times per day  . pantoprazole (PROTONIX) IV  40 mg Intravenous QHS  . piperacillin-tazobactam (ZOSYN)  IV  3.375 g Intravenous 3 times per day  . polyethylene glycol  17 g Oral BID  . senna  1 tablet Oral BID   Continuous Infusions: . 0.9 % NaCl with KCl 20 mEq / L 100 mL/hr at 08/06/13 1210    Time spent: 25 minutes.    LOS: 3 days   [facility] Pager [phone]. If unable to reach me by pager, please call my cell phone at [phone].  *Please refer to [email or web address], password [email or web address] to get updated schedule on who will round on this patient, as hospitalists switch teams weekly. If 7PM-7AM, please contact night-coverage at [email or web address], password [email or web address] for any overnight needs.  08/07/2013, 7:57 AM    **Disclaimer: This note was dictated with voice recognition software. Similar sounding words can inadvertently be transcribed and this note may contain transcription errors which may not have been corrected upon publication of note.**

## 2013-08-07 NOTE — Progress Notes (Signed)
Seen, agree with above.  Improving. Start clear liquids.

## 2013-08-08 NOTE — Progress Notes (Signed)
Seen, agree with above.  Continues to improve.    Advance diet.  Maybe home in next 24-48 hours.

## 2013-08-08 NOTE — Plan of Care (Signed)
Problem: Food- and Nutrition-Related Knowledge Deficit (NB-1.1) Goal: Nutrition education Formal process to instruct or train a patient/client in a skill or to impart knowledge to help patients/clients voluntarily manage or modify food choices and eating behavior to maintain or improve health. Outcome: Completed/Met Date Met:  08/08/13 RD received consult for diet education regarding Low Fiber/Residue and Celiac Disease  Pt reported being dx with Celiac disease seven years ago. Has been able to find appropriate food options compliant with disease, and prepares a variety of meals she can tolerate. Weight remained stable. No questions regarding celiac diet. Pt also currently dx with diverticulitis, MD allowed full liquid diet advancement. RD confirmed with Patient Risk analyst which full liquid diet food options were also compliant with celiac disease, relayed to RN and pt.  Pt has been tolerating grits, V8 juice, and modified chicken broth, eating 100%. Denied nausea, abd pain or vomiting post meals. Plan to advance to a Low fiber/Residue diet tomorrow per RN. Provided pt with "Fiber-Restricted Nutrition Therapy" handout and "Gradually Adding Fiber Back into Diet" sample menus from the [employer], noted to pt to substitute grain foods with gluten-free options (ex: one slice white bread = one slice GF white bread). Recommended pt to decrease intake of nuts, seeds and fresh fruits and vegetables; encouraged intake of well cooked/canned fruits/vegetables, low fat and minimal intake of high sugar foods. Pt to add back fiber foods as tolerated, reviewed sample menus  Teach back method implemented. Pt verbalized understanding. Provided pt with RD contact information, encouraged to contact with additional questions or concerns  Atlee Abide MS RD LDN Clinical Dietitian [phone]

## 2013-08-08 NOTE — Progress Notes (Signed)
Progress Note   ALACIA REHMANN BOF:[id number] DOB: [birth year] DOA: 08/04/2013 PCP: Cari Caraway, MD   Brief Narrative:   [name] is an 73 y.o. female with a PMH of hypertension, prior diverticulitis and frequent UTIs who was admitted 08/04/13 with a chief complaint of a seven-day history of abdominal pain and low-grade fevers associated with nausea/vomiting. A CT scan of the abdomen, done on admission, revealed diverticulitis of the sigmoid colon, constipation, and a 3 cm diverticular abscess. Surgery is following the patient.  Assessment/Plan:   Principal Problem:   Diverticulitis of large intestine with abscess without bleeding  Continue IV fluids, pain medications, anti-emetics and IV Zosyn. Diet advanced to clear liquids 08/07/13 and to full liquids today.  Being followed by Gen. surgery, but no plans to repeat CT scan or perform surgery at this time given resolution of symptoms.  Active Problems:   Hypokalemia  Corrected with potassium added to IV fluids.    Thickened endometrium incidentally noted on CT  Polypoid lesion noted. Will need outpatient followup with OB/GYN for hysteroscopy/endometrial biopsy.    Barrett's esophagus  Continue PPI therapy.    HTN (hypertension)  Norvasc and losartan on hold. Continue IV metoprolol.    Unspecified constipation  Continue MiraLAX when necessary.    DVT Prophylaxis  Continue subcutaneous heparin.  Code Status: Full. Family Communication: Husband updated at the bedside 08/07/13. Disposition Plan: Home when stable.   IV Access:    Peripheral IV   Procedures and diagnostic studies:    08/04/13: CT of the abdomen and pelvis: Large segment of diverticulitis involving the sigmoid colon, likely causing a functional obstruction with fairly significant constipation. Suspect a 3 cm diverticular abscess between the sigmoid colon and the uterus. Followup CT scan after appropriate antibiotic therapy may be helpful. Possible  thickened endometrium for age. Pelvic ultrasound may be helpful. Normal appearance of the solid abdominal organs.  08/05/13: Pelvic/transvaginal ultrasound: 8 mm polypoid soft tissue density noted within the endometrial canal. A focal endometrial lesion suspected. Consider sonohysterogram for further evaluation, prior to hysteroscopy or endometrial biopsy.   Medical Consultants:    Dr. Earnstine Regal, Surgery.   Other Consultants:    None.   Anti-Infectives:    Unasyn 08/04/13---> 08/05/13  Zosyn 08/05/13 --->  Subjective:   [name] is tolerating full liquids. She continues to have occasional loose stools. No nausea or vomiting. No significant pain.  Objective:    Filed Vitals:   08/07/13 1400 08/07/13 2201 08/07/13 2340 08/08/13 0535  BP: 154/68 176/74 154/60 110/78  Pulse: 68 70  73  Temp: 98.1 F (36.7 C) 98.5 F (36.9 C)  97.9 F (36.6 C)  TempSrc: Oral Oral  Oral  Resp: 20 18  18   Height:      Weight:      SpO2: 100% 99%  99%    Intake/Output Summary (Last 24 hours) at 08/08/13 0800 Last data filed at 08/08/13 0536  Gross per 24 hour  Intake    866 ml  Output      0 ml  Net    866 ml    Exam: Gen:  NAD, sitting up in chair Cardiovascular:  RRR, No M/R/G Respiratory:  Lungs CTAB Gastrointestinal:  Abdomen soft, nontender, + BS Extremities:  No C/E/C   Data Reviewed:    Labs: Basic Metabolic Panel:  Recent Labs Lab 08/04/13 1635 08/05/13 0411 08/06/13 0728 08/07/13 0418  NA 132* 137 138 137  K 4.2 3.8  3.5* 4.0  CL 93* 100 101 101  CO2 20 22 19  17*  GLUCOSE 114* 91 73 70  BUN 14 12 10 7   CREATININE 0.71 0.69 0.61 0.59  CALCIUM 9.4 8.5 8.4 8.9   GFR Estimated Creatinine Clearance: 50.5 ml/min (by C-G formula based on Cr of 0.59). Liver Function Tests:  Recent Labs Lab 08/04/13 1635  AST 16  ALT 9  ALKPHOS 116  BILITOT 0.7  PROT 7.7  ALBUMIN 3.6    Recent Labs Lab 08/04/13 1635  LIPASE 14   CBC:  Recent Labs Lab  08/04/13 1635 08/05/13 0411 08/06/13 0728 08/07/13 0418  WBC 16.3* 10.7* 9.1 5.2  NEUTROABS 13.4*  --   --   --   HGB 12.9 10.5* 10.5* 10.5*  HCT 38.5 31.6* 31.8* 31.4*  MCV 89.5 89.5 89.1 89.0  PLT 358 319 307 334   Microbiology No results found for this or any previous visit (from the past 240 hour(s)).   Medications:   . acidophilus  1 capsule Oral Daily  . cycloSPORINE  1 drop Both Eyes BID  . heparin  5,000 Units Subcutaneous 3 times per day  . metoprolol  5 mg Intravenous 3 times per day  . pantoprazole (PROTONIX) IV  40 mg Intravenous QHS  . piperacillin-tazobactam (ZOSYN)  IV  3.375 g Intravenous 3 times per day   Continuous Infusions: . 0.9 % NaCl with KCl 20 mEq / L 100 mL/hr at 08/08/13 0723    Time spent: 25 minutes.    LOS: 4 days   [facility] Pager [phone]. If unable to reach me by pager, please call my cell phone at [phone].  *Please refer to [email or web address], password [email or web address] to get updated schedule on who will round on this patient, as hospitalists switch teams weekly. If 7PM-7AM, please contact night-coverage at [email or web address], password [email or web address] for any overnight needs.  08/08/2013, 8:00 AM    **Disclaimer: This note was dictated with voice recognition software. Similar sounding words can inadvertently be transcribed and this note may contain transcription errors which may not have been corrected upon publication of note.**

## 2013-08-08 NOTE — Progress Notes (Signed)
Patient ID: [name], female   DOB: Sep 12, 1940, 73 y.o.   MRN: [id number]  Subjective: Feeling much better.  No n/v.  2 loose BMs today.  Afebrile.  VSS.    Objective:  Vital signs:  Filed Vitals:   08/07/13 1400 08/07/13 2201 08/07/13 2340 08/08/13 0535  BP: 154/68 176/74 154/60 110/78  Pulse: 68 70  73  Temp: 98.1 F (36.7 C) 98.5 F (36.9 C)  97.9 F (36.6 C)  TempSrc: Oral Oral  Oral  Resp: _0 Height:      Weight:      SpO2: 100% 99%  99%    Last BM Date: 08/08/13  Intake/Output   Yesterday:  07/29 0701 - 07/30 0700 In: 866 [P.O.:225; I.V.:590; IV Piggyback:51] Out: -  This shift:    I/O last 3 completed shifts: In: 2066 [P.O.:225; I.V.:1790; IV Piggyback:51] Out: -    Physical Exam:  General: Pt awake/alert/oriented x4 in no acute distress  Abdomen: Soft. Nondistended. Minimal TTP with deep palpation  to pelvic region. No evidence of peritonitis. No incarcerated hernias.  Problem List:   Principal Problem:   Diverticulitis of large intestine with abscess without bleeding Active Problems:   Barrett's esophagus   Hypokalemia   HTN (hypertension)   Unspecified constipation   Thickened endometrium incidentally noted on CT   Uterine polyp    Results:   Labs: Results for orders placed during the hospital encounter of 08/04/13 (from the past 48 hour(s))  CBC     Status: Abnormal   Collection Time    08/07/13  4:18 AM      Result Value Ref Range   WBC 5.2  4.0 - 10.5 K/uL   RBC 3.53 (*) 3.87 - 5.11 MIL/uL   Hemoglobin 10.5 (*) 12.0 - 15.0 g/dL   HCT 31.4 (*) 36.0 - 46.0 %   MCV 89.0  78.0 - 100.0 fL   MCH 29.7  26.0 - 34.0 pg   MCHC 33.4  30.0 - 36.0 g/dL   RDW 13.1  11.5 - 15.5 %   Platelets 334  150 - 400 K/uL  BASIC METABOLIC PANEL     Status: Abnormal   Collection Time    08/07/13  4:18 AM      Result Value Ref Range   Sodium 137  137 - 147 mEq/L   Potassium 4.0  3.7 - 5.3 mEq/L   Chloride 101  96 - 112 mEq/L   CO2 17 (*) 19 - 32  mEq/L   Glucose, Bld 70  70 - 99 mg/dL   BUN 7  6 - 23 mg/dL   Creatinine, Ser 0.59  0.50 - 1.10 mg/dL   Calcium 8.9  8.4 - 10.5 mg/dL   GFR calc non Af Amer 87 (*) >90 mL/min   GFR calc Af Amer >90  >90 mL/min   Comment: (NOTE)     The eGFR has been calculated using the CKD EPI equation.     This calculation has not been validated in all clinical situations.     eGFR's persistently <90 mL/min signify possible Chronic Kidney     Disease.   Anion gap 19 (*) 5 - 15    Imaging / Studies: No results found.  Scheduled Meds: . acidophilus  1 capsule Oral Daily  . cycloSPORINE  1 drop Both Eyes BID  . heparin  5,000 Units Subcutaneous 3 times per day  . metoprolol  5 mg Intravenous 3 times per day  .  pantoprazole (PROTONIX) IV  40 mg Intravenous QHS  . piperacillin-tazobactam (ZOSYN)  IV  3.375 g Intravenous 3 times per day   Continuous Infusions: . 0.9 % NaCl with KCl 20 mEq / L 100 mL/hr at 08/08/13 0723   PRN Meds:.acetaminophen, acetaminophen, albuterol, guaiFENesin-dextromethorphan, hydrALAZINE, morphine injection, ondansetron (ZOFRAN) IV, ondansetron, oxyCODONE, polyethylene glycol   Antibiotics: Anti-infectives   Start     Dose/Rate Route Frequency Ordered Stop   08/05/13 1400  piperacillin-tazobactam (ZOSYN) IVPB 3.375 g     3.375 g 12.5 mL/hr over 240 Minutes Intravenous 3 times per day 08/05/13 1203     08/04/13 2245  Ampicillin-Sulbactam (UNASYN) 3 g in sodium chloride 0.9 % 100 mL IVPB  Status:  Discontinued     3 g 100 mL/hr over 60 Minutes Intravenous 3 times per day 08/04/13 2240 08/05/13 1156   08/04/13 2030  piperacillin-tazobactam (ZOSYN) IVPB 3.375 g     3.375 g 100 mL/hr over 30 Minutes Intravenous  Once 08/04/13 2002 08/04/13 2054   08/04/13 2000  piperacillin-tazobactam (ZOSYN) IVPB 3.375 g  Status:  Discontinued     3.375 g 12.5 mL/hr over 240 Minutes Intravenous  Once 08/04/13 1959 08/05/13 0014      Assessment/Plan  Acute diverticulitis with  possible early abscess  Leukocytosis - down to 5.2k  -advance to full liquid diet -IVF, pain control, antiemetics, antibiotics (On Zosyn Day #4)  -Ambulate and IS  -will follow.   Erby Pian, [facility] Surgery Pager [phone] Office [phone]  08/08/2013 9:29 AM

## 2013-08-09 LAB — CBC
HCT: 31.6 % — ABNORMAL LOW (ref 36.0–46.0)
Hemoglobin: 10.6 g/dL — ABNORMAL LOW (ref 12.0–15.0)
MCH: 29.5 pg (ref 26.0–34.0)
MCHC: 33.5 g/dL (ref 30.0–36.0)
MCV: 88 fL (ref 78.0–100.0)
Platelets: 387 10*3/uL (ref 150–400)
RBC: 3.59 MIL/uL — AB (ref 3.87–5.11)
RDW: 13.1 % (ref 11.5–15.5)
WBC: 4.8 10*3/uL (ref 4.0–10.5)

## 2013-08-09 MED ORDER — AMOXICILLIN-POT CLAVULANATE 875-125 MG PO TABS
1.0000 | ORAL_TABLET | Freq: Two times a day (BID) | ORAL | Status: DC
  Filled 2013-08-09 (×2): qty 1

## 2013-08-09 NOTE — Progress Notes (Signed)
Note: Called in RX for Ceftin 500 mg BID x 14 days and Flagyl 500 mg TID x 14 days for tx of diverticulitis.   [name] 08/09/2013 5:34 PM

## 2013-08-09 NOTE — Discharge Instructions (Signed)

## 2013-08-09 NOTE — Progress Notes (Signed)
Seen, agree with above. Low residue diet. Home when tolerating.

## 2013-08-09 NOTE — Progress Notes (Signed)
Patient ID: [name], female   DOB: 05/16/1940, 73 y.o.   MRN: [id number]   Subjective: No pain.  No diarrhea, n/v.  Tolerating fulls.    Objective:  Vital signs:  Filed Vitals:   08/08/13 1401 08/08/13 2125 08/08/13 2230 08/09/13 0519  BP: 102/84 122/60 140/80 152/82  Pulse: 65  67 65  Temp: 97.7 F (36.5 C)  97.3 F (36.3 C) 97.8 F (36.6 C)  TempSrc: Oral  Oral Oral  Resp: 17  20 18   Height:      Weight:      SpO2: 100%  100% 100%    Last BM Date: 08/08/13  Intake/Output   Yesterday:  07/30 0701 - 07/31 0700 In: 700 [P.O.:50; I.V.:600; IV Piggyback:50] Out: -  This shift:     Physical Exam:  General: Pt awake/alert/oriented x4 in no acute distress  Abdomen: Soft. Nondistended. Non tender. No evidence of peritonitis. No incarcerated hernias.   Problem List:   Principal Problem:   Diverticulitis of large intestine with abscess without bleeding Active Problems:   Barrett's esophagus   Hypokalemia   HTN (hypertension)   Unspecified constipation   Thickened endometrium incidentally noted on CT   Uterine polyp    Results:   Labs: Results for orders placed during the hospital encounter of 08/04/13 (from the past 48 hour(s))  CBC     Status: Abnormal   Collection Time    08/09/13  4:31 AM      Result Value Ref Range   WBC 4.8  4.0 - 10.5 K/uL   RBC 3.59 (*) 3.87 - 5.11 MIL/uL   Hemoglobin 10.6 (*) 12.0 - 15.0 g/dL   HCT 31.6 (*) 36.0 - 46.0 %   MCV 88.0  78.0 - 100.0 fL   MCH 29.5  26.0 - 34.0 pg   MCHC 33.5  30.0 - 36.0 g/dL   RDW 13.1  11.5 - 15.5 %   Platelets 387  150 - 400 K/uL    Imaging / Studies: No results found.  Scheduled Meds: . acidophilus  1 capsule Oral Daily  . amoxicillin-clavulanate  1 tablet Oral BID  . cycloSPORINE  1 drop Both Eyes BID  . heparin  5,000 Units Subcutaneous 3 times per day  . metoprolol  5 mg Intravenous 3 times per day  . pantoprazole (PROTONIX) IV  40 mg Intravenous QHS   Continuous Infusions: . 0.9  % NaCl with KCl 20 mEq / L 50 mL/hr at 08/08/13 2118   PRN Meds:.acetaminophen, acetaminophen, albuterol, guaiFENesin-dextromethorphan, hydrALAZINE, morphine injection, ondansetron (ZOFRAN) IV, ondansetron, oxyCODONE, polyethylene glycol   Antibiotics: Anti-infectives   Start     Dose/Rate Route Frequency Ordered Stop   08/09/13 1400  amoxicillin-clavulanate (AUGMENTIN) 875-125 MG per tablet 1 tablet     1 tablet Oral 2 times daily 08/09/13 0806     08/05/13 1400  piperacillin-tazobactam (ZOSYN) IVPB 3.375 g  Status:  Discontinued     3.375 g 12.5 mL/hr over 240 Minutes Intravenous 3 times per day 08/05/13 1203 08/09/13 0806   08/04/13 2245  Ampicillin-Sulbactam (UNASYN) 3 g in sodium chloride 0.9 % 100 mL IVPB  Status:  Discontinued     3 g 100 mL/hr over 60 Minutes Intravenous 3 times per day 08/04/13 2240 08/05/13 1156   08/04/13 2030  piperacillin-tazobactam (ZOSYN) IVPB 3.375 g     3.375 g 100 mL/hr over 30 Minutes Intravenous  Once 08/04/13 2002 08/04/13 2054   08/04/13 2000  piperacillin-tazobactam (ZOSYN) IVPB 3.375  g  Status:  Discontinued     3.375 g 12.5 mL/hr over 240 Minutes Intravenous  Once 08/04/13 1959 08/05/13 0014     Assessment/Plan  Acute diverticulitis with possible early abscess  Leukocytosis - down to 4.8k  -advance to soft diet.  If able to tolerate, stable for discharge from surgical standpoint. -zosyn D#5, change to augmentin.  Will need 14 days Rx for augmentin to complete her course. -outpatient follow up with Dr. Harlow Asa in 2-3 weeks.  Erby Pian, [facility] Surgery Pager [phone] Office [phone]  08/09/2013 8:32 AM

## 2013-08-09 NOTE — Discharge Summary (Addendum)
Physician Discharge Summary  [name]:[id number] DOB: 07/02/1940 DOA: 08/04/2013  PCP: Kara Dimitri, MD  Admit date: 08/04/2013 Discharge date: 08/09/2013   Recommendations for Outpatient Follow-Up:   1. The patient will followup with Dr. Gerrit Velazquez of general surgery and Dr. Henderson Velazquez of OB-GYN. 2. Note: Called in RX for Flagyl 500 mg 3 times a day and Ceftin 500 mg twice a day x14 days. These medications do not appear on the AVS.  Discharge Diagnosis:   Principal Problem:    Diverticulitis of large intestine with abscess without bleeding Active Problems:    Barrett's esophagus    Hypokalemia    HTN (hypertension)    Unspecified constipation    Thickened endometrium incidentally noted on CT    Uterine polyp   Discharge Condition: Improved.  Diet recommendation: Low sodium, heart healthy.   History of Present Illness:   [name] is an 73 y.o. female with a PMH of hypertension, prior diverticulitis and frequent UTIs who was admitted 08/04/13 with a chief complaint of a seven-day history of abdominal pain and low-grade fevers associated with nausea/vomiting. A CT scan of the abdomen, done on admission, revealed diverticulitis of the sigmoid colon, constipation, and a 3 cm diverticular abscess as well as an incidentally discovered thickened endometrium.  Hospital Course by Problem:   Principal Problem:  Diverticulitis of large intestine with abscess without bleeding   Treated with IV fluids, pain medications, anti-emetics and 6 days of IV Zosyn. Tolerated advancement of her diet prior to discharge. No further antibiotic therapy felt to be needed.  Is followed by Gen. surgery during the course of her hospital stay and she will see Dr. Gerrit Velazquez in follow up.  Active Problems:  Hypokalemia   Corrected with potassium added to IV fluids.  Thickened endometrium incidentally noted on CT   Polypoid lesion noted. Discussed case with Dr. Henderson Velazquez who will see her in  follow up.  Barrett's esophagus   Continue PPI therapy.  HTN (hypertension)   Outpatient antihypertensives may be resumed upon discharge.  Unspecified constipation   Continue MiraLAX when necessary.  Medical Consultants:    Dr. Velora Velazquez, Surgery.  Discharge Exam:   Filed Vitals:   08/09/13 0519  BP: 152/82  Pulse: 65  Temp: 97.8 F (36.6 C)  Resp: 18   Filed Vitals:   08/08/13 1401 08/08/13 2125 08/08/13 2230 08/09/13 0519  BP: 102/84 122/60 140/80 152/82  Pulse: 65  67 65  Temp: 97.7 F (36.5 C)  97.3 F (36.3 C) 97.8 F (36.6 C)  TempSrc: Oral  Oral Oral  Resp: 17  20 18   Height:      Weight:      SpO2: 100%  100% 100%    Gen:  NAD Cardiovascular:  RRR, No M/R/G Respiratory: Lungs CTAB Gastrointestinal: Abdomen soft, NT/ND with normal active bowel sounds. Extremities: No C/E/C   The results of significant diagnostics from this hospitalization (including imaging, microbiology, ancillary and laboratory) are listed below for reference.     Procedures and Diagnostic Studies:   08/04/13: CT of the abdomen and pelvis: Large segment of diverticulitis involving the sigmoid colon, likely causing a functional obstruction with fairly significant constipation. Suspect a 3 cm diverticular abscess between the sigmoid colon and the uterus. Followup CT scan after appropriate antibiotic therapy may be helpful. Possible thickened endometrium for age. Pelvic ultrasound may be helpful. Normal appearance of the solid abdominal organs.  08/05/13: Pelvic/transvaginal ultrasound: 8 mm polypoid soft tissue density noted within  the endometrial canal. A focal endometrial lesion suspected. Consider sonohysterogram for further evaluation, prior to hysteroscopy or endometrial biopsy.   Labs:   Basic Metabolic Panel:  Recent Labs Lab 08/04/13 1635 08/05/13 0411 08/06/13 0728 08/07/13 0418  NA 132* 137 138 137  K 4.2 3.8 3.5* 4.0  CL 93* 100 101 101  CO2 20 22 19  17*    GLUCOSE 114* 91 73 70  BUN 14 12 10 7   CREATININE 0.71 0.69 0.61 0.59  CALCIUM 9.4 8.5 8.4 8.9   GFR Estimated Creatinine Clearance: 50.5 ml/min (by C-G formula based on Cr of 0.59). Liver Function Tests:  Recent Labs Lab 08/04/13 1635  AST 16  ALT 9  ALKPHOS 116  BILITOT 0.7  PROT 7.7  ALBUMIN 3.6    Recent Labs Lab 08/04/13 1635  LIPASE 14   CBC:  Recent Labs Lab 08/04/13 1635 08/05/13 0411 08/06/13 0728 08/07/13 0418 08/09/13 0431  WBC 16.3* 10.7* 9.1 5.2 4.8  NEUTROABS 13.4*  --   --   --   --   HGB 12.9 10.5* 10.5* 10.5* 10.6*  HCT 38.5 31.6* 31.8* 31.4* 31.6*  MCV 89.5 89.5 89.1 89.0 88.0  PLT 358 319 307 334 387    Discharge Instructions:   Discharge Instructions   Activity as tolerated - No restrictions    Complete by:  As directed      Call MD for:  extreme fatigue    Complete by:  As directed      Call MD for:  persistant nausea and vomiting    Complete by:  As directed      Call MD for:  severe uncontrolled pain    Complete by:  As directed      Call MD for:  temperature >100.4    Complete by:  As directed      Diet - low sodium heart healthy    Complete by:  As directed      Discharge instructions    Complete by:  As directed   You were cared for by Dr. Hillery Aldo  (a hospitalist) during your hospital stay. If you have any questions about your discharge medications or the care you received while you were in the hospital after you are discharged, you can call the unit and ask to speak with the hospitalist on call if the hospitalist that took care of you is not available. Once you are discharged, your primary care physician will handle any further medical issues. Please note that NO REFILLS for any discharge medications will be authorized once you are discharged, as it is imperative that you return to your primary care physician (or establish a relationship with a primary care physician if you do not have one) for your aftercare needs so that  they can reassess your need for medications and monitor your lab values.  Any outstanding tests can be reviewed by your PCP at your follow up visit.  It is also important to review any medicine changes with your PCP.  Please bring these d/c instructions with you to your next visit so your physician can review these changes with you.            Medication List    STOP taking these medications       amoxicillin 875 MG tablet  Commonly known as:  AMOXIL     magnesium citrate Soln      TAKE these medications       acidophilus Caps capsule  Take 1 capsule by mouth daily.     amLODipine 5 MG tablet  Commonly known as:  NORVASC  Take 5 mg by mouth daily.     aspirin EC 81 MG tablet  Take 81 mg by mouth daily.     BEPREVE 1.5 % Soln  Generic drug:  Bepotastine Besilate  Place 1 drop into both eyes daily.     bisacodyl 10 MG suppository  Commonly known as:  DULCOLAX  Place 10 mg rectally as needed for moderate constipation.     cholecalciferol 1000 UNITS tablet  Commonly known as:  VITAMIN D  Take 1,000 Units by mouth daily.     desonide 0.05 % cream  Commonly known as:  DESOWEN  Apply 1 application topically 2 (two) times daily.     JUICE PLUS FIBRE PO  Take 4 capsules by mouth daily.     losartan 100 MG tablet  Commonly known as:  COZAAR  Take 1 tablet (100 mg total) by mouth daily.     metoprolol succinate 50 MG 24 hr tablet  Commonly known as:  TOPROL-XL  Take 50 mg by mouth daily. Take with or immediately following a meal.     oxybutynin 10 MG 24 hr tablet  Commonly known as:  DITROPAN-XL  Take 10 mg by mouth at bedtime.     pantoprazole 40 MG tablet  Commonly known as:  PROTONIX  Take 40 mg by mouth 2 (two) times daily.     RESTASIS 0.05 % ophthalmic emulsion  Generic drug:  cycloSPORINE  Place 1 drop into both eyes 2 (two) times daily.           Follow-up Information   Follow up with Kara Heckler, MD. Schedule an appointment as soon as possible  for a visit in 3 weeks.   Specialty:  General Surgery   Contact information:   [address] [place] [address] [phone]       Follow up with HORVATH,MICHELLE A, MD. Schedule an appointment as soon as possible for a visit in 3 weeks. (Call for an appt, they are expecting your call and will set you up to see Dr. Henderson Velazquez.)    Specialty:  Obstetrics and Gynecology   Contact information:   [address] [place] [place] [address] [phone]        Time coordinating discharge: 35 minutes.  Signed:  Asja Frommer  Pager [phone] [facility] 08/09/2013, 1:55 PM

## 2013-08-09 NOTE — Progress Notes (Signed)
Nursing Discharge Summary  Patient ID: SHAKTI FLEER MRN: [id number] DOB/AGE: 07/03/1940 73 y.o.  Admit date: 08/04/2013 Discharge date: 08/09/2013  Discharged Condition: good  Disposition: 01-Home or Self Care  Follow-up Information   Follow up with Earnstine Regal, MD. Schedule an appointment as soon as possible for a visit in 3 weeks.   Specialty:  General Surgery   Contact information:   [address] [place] [address] [phone]       Follow up with HORVATH,MICHELLE A, MD. Schedule an appointment as soon as possible for a visit in 3 weeks. (Call for an appt, they are expecting your call and will set you up to see Dr. Philis Pique.)    Specialty:  Obstetrics and Gynecology   Contact information:   [place] [address] [phone]       Prescriptions Given: NA  Means of Discharge: Home to house with husband via car  Signed: Juanetta Snow 08/09/2013, 11:54 AM

## 2013-08-13 NOTE — ED Provider Notes (Signed)
Medical screening examination/treatment/procedure(s) were conducted as a shared visit with non-physician practitioner(s) and myself.  I personally evaluated the patient during the encounter.   EKG Interpretation None      Pt seen and examined.  D/W J. Geiple PA. Tenderness to Suprapubic abdomen.  History of Lower abd pain over 24 hours.  CT results d/W pt.  Surgery consulted.  Tanna Furry, MD 08/13/13 [phone]

## 2013-09-21 IMAGING — CT CT ABD-PELV W/ CM
1 of 3 series · 14 of 32 positions shown, 19 images · IV contrast (APPLIED)
Comparison: 12/03/2008

CLINICAL DATA: Lower quadrant pain.  Elevated white blood count.

CT ABDOMEN AND PELVIS WITH CONTRAST
TECHNIQUE: Multidetector CT imaging of the abdomen and pelvis was
performed following the standard protocol during bolus
administration of intravenous contrast.
Contrast: 80mL OMNIPAQUE IOHEXOL 300 MG/ML IV SOLN

[Series 2: abd/pel with · axial · 0.74mm/px · z∈[+1662,+2002]mm · 14 of 78 slices shown, 19 images]
[im 5/78  soft-tissue]
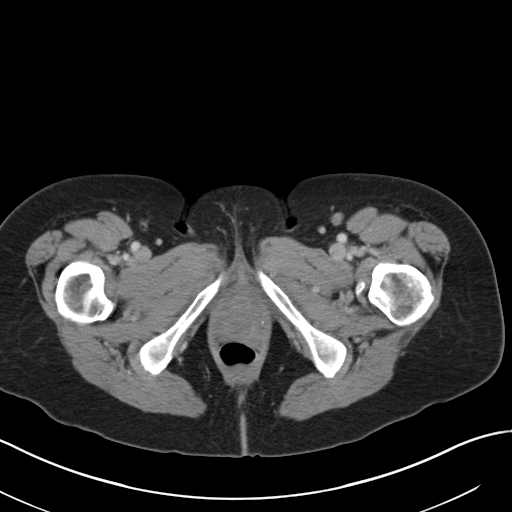
[im 5/78  bone]
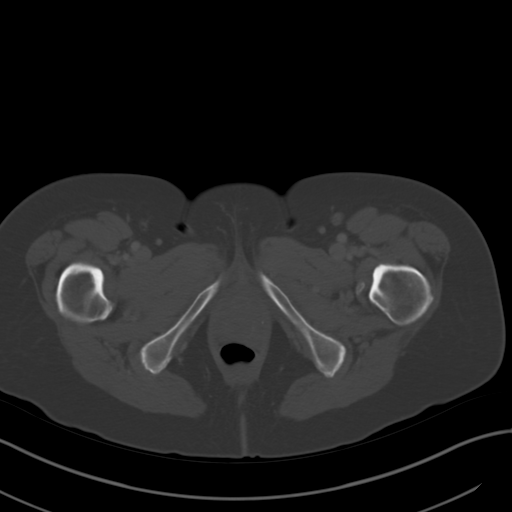
[im 13/78  soft-tissue]
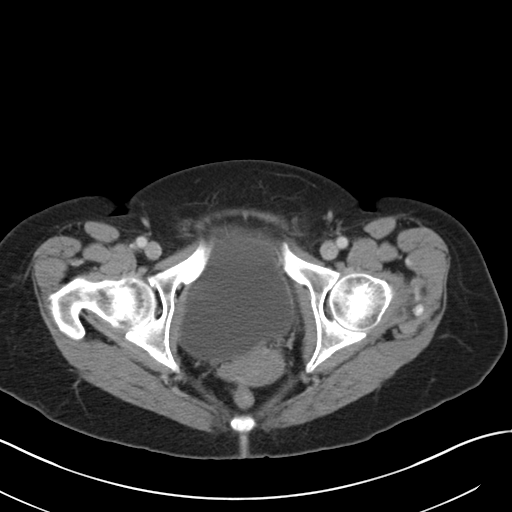
[im 17/78  soft-tissue]
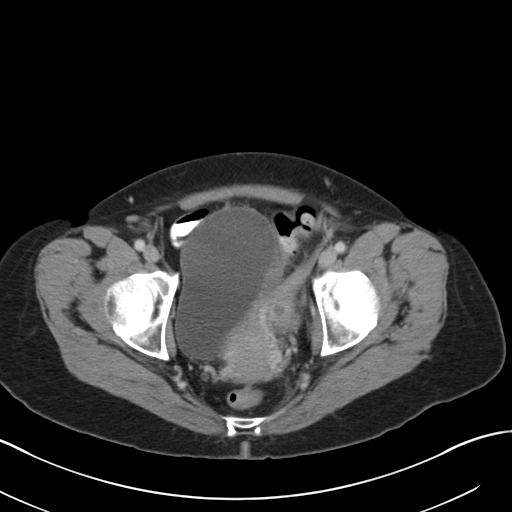
[im 21/78  soft-tissue]
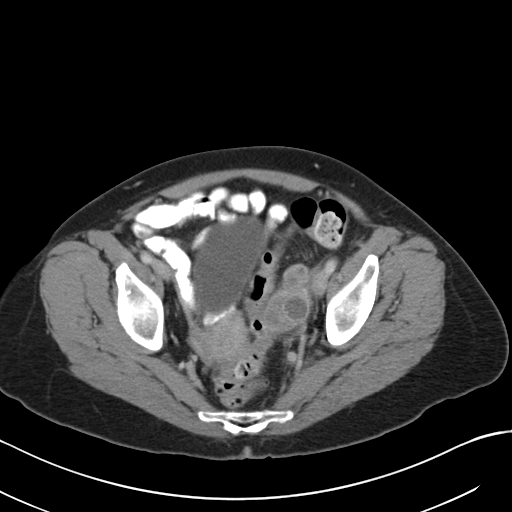
[im 29/78  soft-tissue]
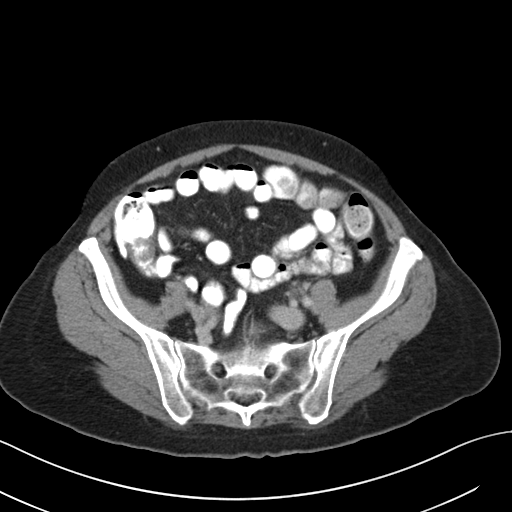
[im 33/78  soft-tissue]
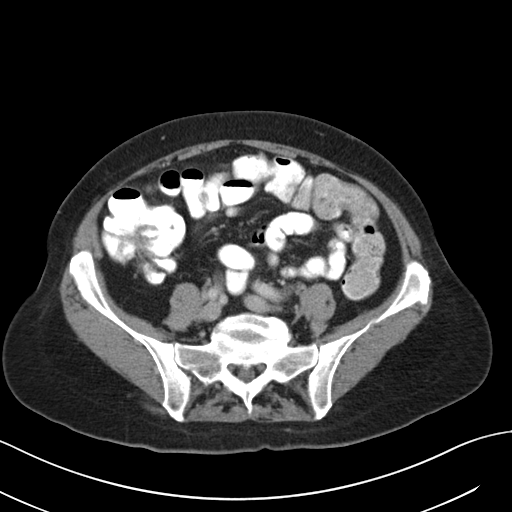
[im 41/78  soft-tissue]
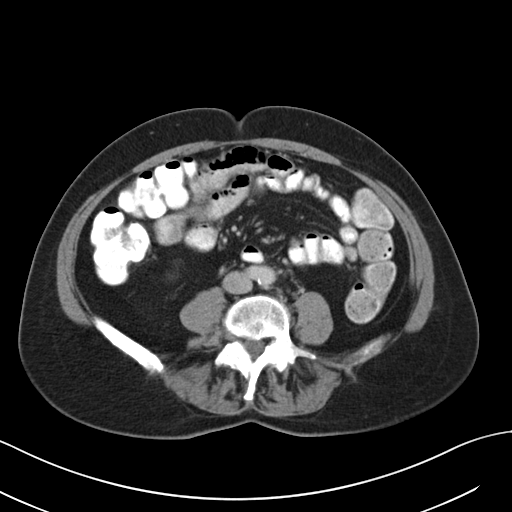
[im 45/78  soft-tissue]
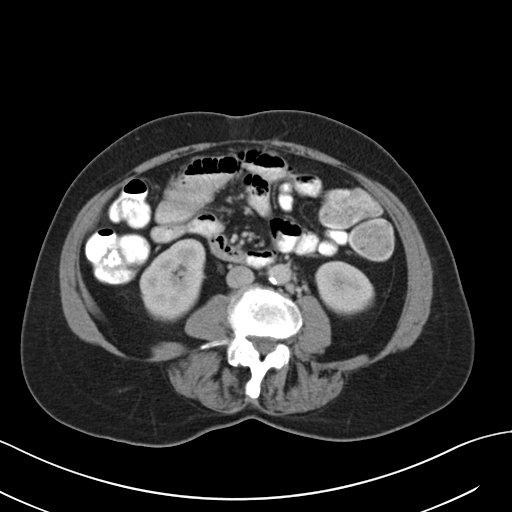
[im 49/78  soft-tissue]
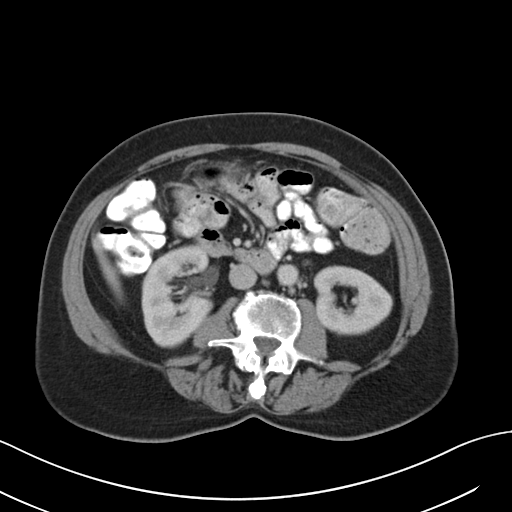
[im 49/78  bone]
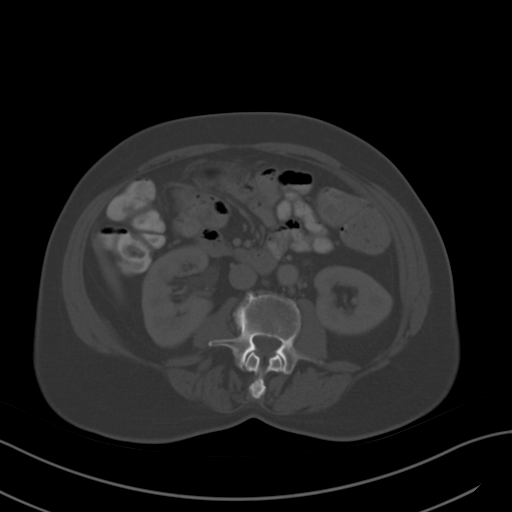
[im 57/78  soft-tissue]
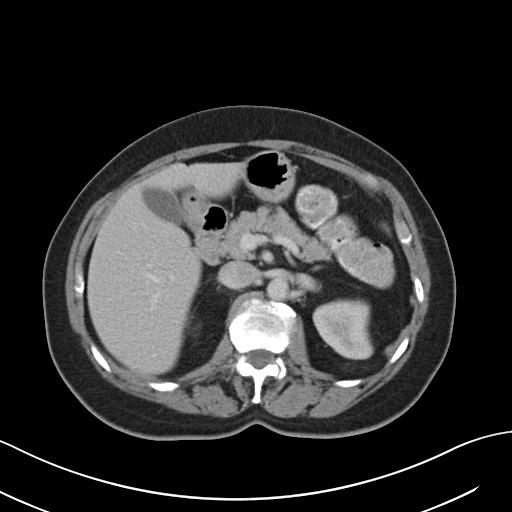
[im 61/78  soft-tissue]
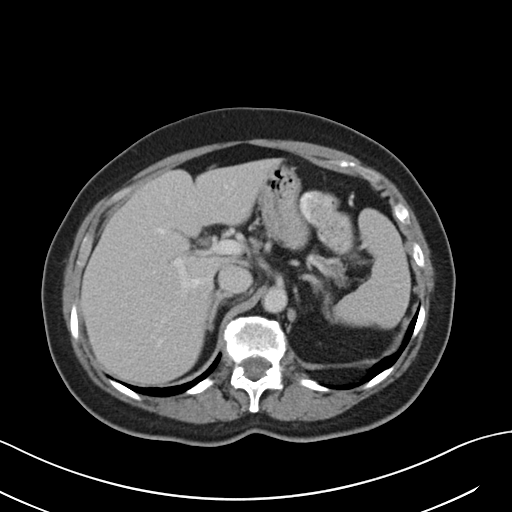
[im 61/78  lung]
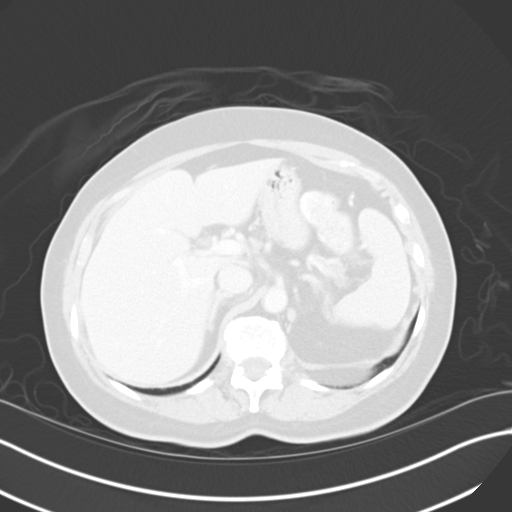
[im 65/78  soft-tissue]
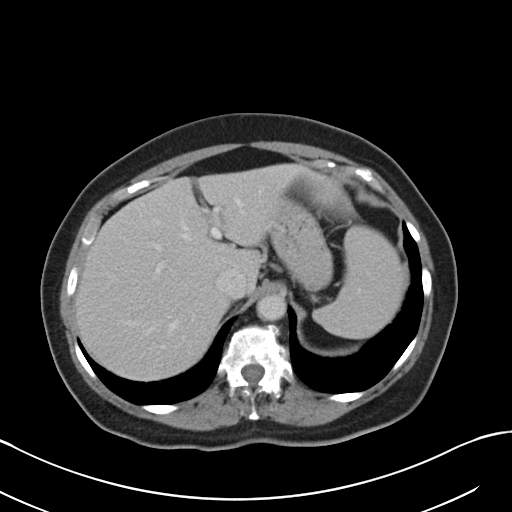
[im 65/78  lung]
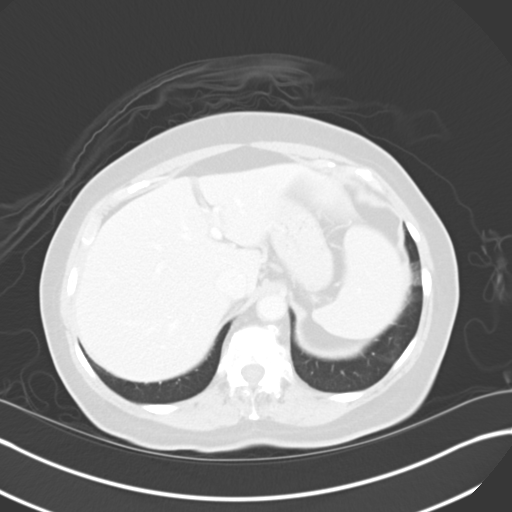
[im 69/78  lung]
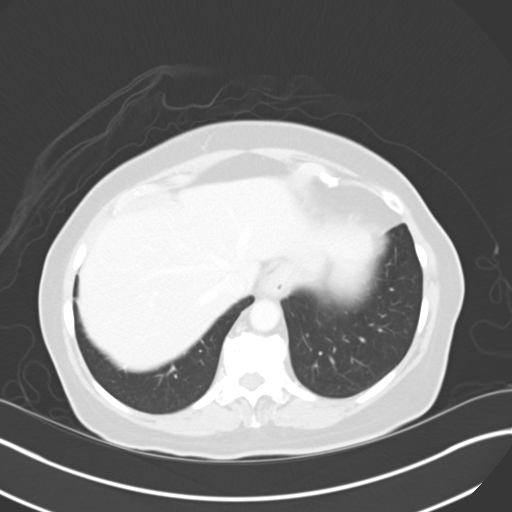
[im 73/78  soft-tissue]
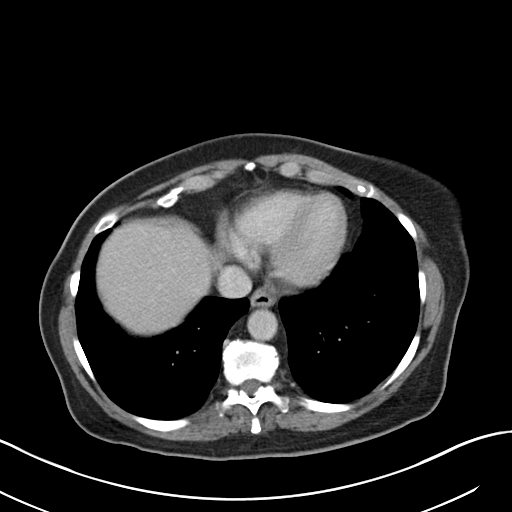
[im 73/78  lung]
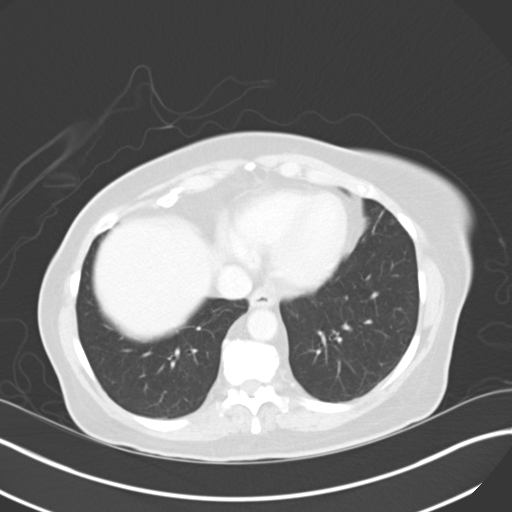

[14 of 32 positions shown; findings below may reference images not displayed]

FINDINGS: The left ovary is markedly abnormal and changed since the
prior study of 12/03/2008.  It now measures 5.7 x 4.4 x 3.5 cm and
has multiple cystic areas within it as well as slight soft tissue
inflammation around the ovary.  This could represent tumor or
infection.  It is immediately adjacent to an area of extensive
diverticulosis of the sigmoid portion of the colon but there is no
finding of acute diverticulitis.

The liver, spleen, pancreas, gallbladder, adrenal glands, and
kidneys are normal.  No dilated bowel.  The right ovary and uterus
are normal.  No free fluid in the pelvis.  No acute osseous
abnormalities.
IMPRESSION: The patient has developed an abnormal appearance of the left ovary.
The ovary has enlarged and there are now  multiple cystic areas
within the ovary.  Slight inflammatory changes around the ovary.
This could be due to infection or tumor.

## 2013-09-23 ENCOUNTER — Encounter (INDEPENDENT_AMBULATORY_CARE_PROVIDER_SITE_OTHER): Payer: Medicare Other | Admitting: Surgery

## 2013-09-23 ENCOUNTER — Telehealth: Payer: Self-pay | Admitting: Gastroenterology

## 2013-09-23 NOTE — Telephone Encounter (Signed)
Left message for patient to call back  

## 2013-09-23 NOTE — Telephone Encounter (Signed)
Patient is scheduled to see Dr. Fuller Plan on 09/26/13 10:15

## 2013-09-26 ENCOUNTER — Encounter: Payer: Self-pay | Admitting: Gastroenterology

## 2013-09-26 ENCOUNTER — Ambulatory Visit (INDEPENDENT_AMBULATORY_CARE_PROVIDER_SITE_OTHER): Payer: Medicare Other | Admitting: Gastroenterology

## 2013-09-26 VITALS — BP 120/66 | HR 72 | Ht 59.5 in | Wt 132.4 lb

## 2013-09-26 DIAGNOSIS — K5900 Constipation, unspecified: Secondary | ICD-10-CM

## 2013-09-26 DIAGNOSIS — K5732 Diverticulitis of large intestine without perforation or abscess without bleeding: Secondary | ICD-10-CM

## 2013-09-26 MED ORDER — PEG-KCL-NACL-NASULF-NA ASC-C 100 G PO SOLR: 1.0000 | Freq: Once | ORAL | Status: DC

## 2013-09-26 NOTE — Progress Notes (Signed)
    History of Present Illness: This is a 73 year old female hospitalized twice for diverticular disease. Her most recent hospitalization had a possible small diverticular abscess. She was seen by Dr. Harlow Asa as an outpatient. She has chronic constipation which is now under better control and daily MiraLax. She has fully recovered and has no GI complaints. Her last colonoscopy was performed in January 2011 showing moderate sigmoid colon diverticulosis  Abd/plevic CT 7/26 IMPRESSION:   1. Large segment of diverticulitis involving the sigmoid colon  likely causing a functional obstruction with fairly significant  constipation. Suspect a 3 cm diverticular abscess between the sigmoid colon in the uterus. A followup CT scan after appropriate antibiotic therapy may be helpful to re-evaluate (2-3 weeks).  2. Possible thickened endometrium for age. Pelvic ultrasound may be helpful for further evaluation.  3. Normal appearance of the solid abdominal organs.   Current Medications, Allergies, Past Medical History, Past Surgical History, Family History and Social History were reviewed in [employer] record.  Physical Exam: General: Well developed , well nourished, no acute distress Head: Normocephalic and atraumatic Eyes:  sclerae anicteric, EOMI Ears: Normal auditory acuity Mouth: No deformity or lesions Lungs: Clear throughout to auscultation Heart: Regular rate and rhythm; no murmurs, rubs or bruits Abdomen: Soft, non tender and non distended. No masses, hepatosplenomegaly or hernias noted. Normal Bowel sounds Rectal: Deferred to colonoscopy Musculoskeletal: Symmetrical with no gross deformities  Pulses:  Normal pulses noted Extremities: No clubbing, cyanosis, edema or deformities noted Neurological: Alert oriented x 4, grossly nonfocal Psychological:  Alert and cooperative. Normal mood and affect  Assessment and Recommendations:  1. Diverticulitis, 2 episodes requiring  hospitalization. Chronic constipation. Most recent hospitalization with a possible diverticular abscess. Continue daily MiraLax along with a long-term high fiber diet and adequate daily water intake. Proceed with colonoscopy to evaluate the extent of diverticular disease and to exclude neoplasms and other disorders. Consideration for elective colon resection. The risks, benefits, and alternatives to colonoscopy with possible biopsy and possible polypectomy were discussed with the patient and they consent to proceed.

## 2013-09-26 NOTE — Patient Instructions (Signed)
You have been scheduled for a colonoscopy. Please follow written instructions given to you at your visit today.  Please pick up your prep kit at the pharmacy within the next 1-3 days. If you use inhalers (even only as needed), please bring them with you on the day of your procedure. Your physician has requested that you go to www.startemmi.com and enter the access code given to you at your visit today. This web site gives a general overview about your procedure. However, you should still follow specific instructions given to you by our office regarding your preparation for the procedure.  Thank you for choosing me and [facility].  Pricilla Riffle. Dagoberto Ligas., MD., Marval Regal  cc: Armandina Gemma, MD

## 2013-10-02 ENCOUNTER — Encounter: Payer: Self-pay | Admitting: Gastroenterology

## 2013-10-08 ENCOUNTER — Encounter: Payer: Self-pay | Admitting: Gastroenterology

## 2013-10-08 ENCOUNTER — Ambulatory Visit (AMBULATORY_SURGERY_CENTER): Payer: Medicare Other | Admitting: Gastroenterology

## 2013-10-08 VITALS — BP 146/88 | HR 66 | Temp 98.2°F | Resp 16

## 2013-10-08 DIAGNOSIS — K630 Abscess of intestine: Secondary | ICD-10-CM

## 2013-10-08 DIAGNOSIS — R933 Abnormal findings on diagnostic imaging of other parts of digestive tract: Secondary | ICD-10-CM

## 2013-10-08 DIAGNOSIS — K5720 Diverticulitis of large intestine with perforation and abscess without bleeding: Secondary | ICD-10-CM

## 2013-10-08 DIAGNOSIS — K5732 Diverticulitis of large intestine without perforation or abscess without bleeding: Secondary | ICD-10-CM

## 2013-10-08 MED ORDER — SODIUM CHLORIDE 0.9 % IV SOLN: 500.0000 mL | INTRAVENOUS | Status: DC

## 2013-10-08 NOTE — Progress Notes (Signed)
Procedure ends, to recovery, report given and VSS. 

## 2013-10-08 NOTE — Progress Notes (Signed)
CONTINUE YOUR MIRALX AND HIGH FIBER DIET WITH LIBERAL FLUID INTAKE. OUTPATIENT FOLLOW UP WITH DR. GERKIN.    YOU HAD AN ENDOSCOPIC PROCEDURE TODAY AT [facility]: Refer to the procedure report that was given to you for any specific questions about what was found during the examination.  If the procedure report does not answer your questions, please call your gastroenterologist to clarify.  If you requested that your care partner not be given the details of your procedure findings, then the procedure report has been included in a sealed envelope for you to review at your convenience later.  YOU SHOULD EXPECT: Some feelings of bloating in the abdomen. Passage of more gas than usual.  Walking can help get rid of the air that was put into your GI tract during the procedure and reduce the bloating. If you had a lower endoscopy (such as a colonoscopy or flexible sigmoidoscopy) you may notice spotting of blood in your stool or on the toilet paper. If you underwent a bowel prep for your procedure, then you may not have a normal bowel movement for a few days.  DIET: Your first meal following the procedure should be a light meal and then it is ok to progress to your normal diet.  A half-sandwich or bowl of soup is an example of a good first meal.  Heavy or fried foods are harder to digest and may make you feel nauseous or bloated.  Likewise meals heavy in dairy and vegetables can cause extra gas to form and this can also increase the bloating.  Drink plenty of fluids but you should avoid alcoholic beverages for 24 hours.  ACTIVITY: Your care partner should take you home directly after the procedure.  You should plan to take it easy, moving slowly for the rest of the day.  You can resume normal activity the day after the procedure however you should NOT DRIVE or use heavy machinery for 24 hours (because of the sedation medicines used during the test).    SYMPTOMS TO REPORT IMMEDIATELY: A  gastroenterologist can be reached at any hour.  During normal business hours, 8:30 AM to 5:00 PM Monday through Friday, call [phone].  After hours and on weekends, please call the GI answering service at [phone] who will take a message and have the physician on call contact you.   Following lower endoscopy (colonoscopy or flexible sigmoidoscopy):  Excessive amounts of blood in the stool  Significant tenderness or worsening of abdominal pains  Swelling of the abdomen that is new, acute  Fever of 100F or higher  FOLLOW UP: If any biopsies were taken you will be contacted by phone or by letter within the next 1-3 weeks.  Call your gastroenterologist if you have not heard about the biopsies in 3 weeks.  Our staff will call the home number listed on your records the next business day following your procedure to check on you and address any questions or concerns that you may have at that time regarding the information given to you following your procedure. This is a courtesy call and so if there is no answer at the home number and we have not heard from you through the emergency physician on call, we will assume that you have returned to your regular daily activities without incident.  SIGNATURES/CONFIDENTIALITY: You and/or your care partner have signed paperwork which will be entered into your electronic medical record.  These signatures attest to the fact that that the information  above on your After Visit Summary has been reviewed and is understood.  Full responsibility of the confidentiality of this discharge information lies with you and/or your care-partner.

## 2013-10-08 NOTE — Op Note (Signed)
[facility]  [address] [place], [id number]   COLONOSCOPY PROCEDURE REPORT  PATIENT: [name], [name]  MR#: [id number] BIRTHDATE: 09/11/1940 , 73  yrs. old GENDER: female ENDOSCOPIST: Ladene Artist, MD, [facility] PROCEDURE DATE:  10/08/2013 PROCEDURE:   Colonoscopy, diagnostic First Screening Colonoscopy - Avg.  risk and is 50 yrs.  old or older - No.  Prior Negative Screening - Now for repeat screening. N/A  History of Adenoma - Now for follow-up colonoscopy & has been > or = to 3 yrs.  N/A  Polyps Removed Today? No.  Polyps Removed Today? No.  Recommend repeat exam, <10 yrs? Polyps Removed Today? No.  Recommend repeat exam, <10 yrs? No. ASA CLASS:   Class II INDICATIONS:an abnormal imaging results and diverticulitis with abscess. MEDICATIONS: Monitored anesthesia care and Propofol 300 mg IV DESCRIPTION OF PROCEDURE:   After the risks benefits and alternatives of the procedure were thoroughly explained, informed consent was obtained.  The digital rectal exam revealed no abnormalities of the rectum.   The LB [id number]  endoscope was introduced through the anus and advanced to the cecum, which was identified by both the appendix and ileocecal valve. No adverse events experienced.  The sigmoid colon was moderately difficult to traverse. The quality of the prep was good, using MoviPrep  The instrument was then slowly withdrawn as the colon was fully examined.  COLON FINDINGS: There was moderate diverticulosis noted in the sigmoid colon with associated muscular hypertrophy, colonic narrowing and tortuosity.  The colon mucosa was otherwise normal. Retroflexed views revealed no abnormalities. The time to cecum=9 minutes 15 seconds.  Withdrawal time=9 minutes 42 seconds.  The scope was withdrawn and the procedure completed. COMPLICATIONS: There were no immediate complications.  ENDOSCOPIC IMPRESSION: 1.   Moderate diverticulosis with tortuosity and narrowing in  the sigmoid colon 2.   The colon mucosa was otherwise normal  RECOMMENDATIONS: 1.  Continue Miralax and high fiber diet with liberal fluid intake. 2.  Out patient follow-up with Dr. Harlow Asa 3.  Given your age, you will not need another colonoscopy for colon cancer screening or polyp surveillance.  These types of tests usually stop around the age 34.  eSigned:  Ladene Artist, MD, [facility] 10/08/2013 11:42 AM   cc: Armandina Gemma, MD

## 2013-10-08 NOTE — Patient Instructions (Signed)
CONTINUE YOUR MIRALAX AND HIGH FIBER DIET WITH LIBERAL FLUID INTAKE. OUTPATIENT FOLLOW UP WITH DR. GERKIN    YOU HAD AN ENDOSCOPIC PROCEDURE TODAY AT THE [facility]: Refer to the procedure report that was given to you for any specific questions about what was found during the examination.  If the procedure report does not answer your questions, please call your gastroenterologist to clarify.  If you requested that your care partner not be given the details of your procedure findings, then the procedure report has been included in a sealed envelope for you to review at your convenience later.  YOU SHOULD EXPECT: Some feelings of bloating in the abdomen. Passage of more gas than usual.  Walking can help get rid of the air that was put into your GI tract during the procedure and reduce the bloating. If you had a lower endoscopy (such as a colonoscopy or flexible sigmoidoscopy) you may notice spotting of blood in your stool or on the toilet paper. If you underwent a bowel prep for your procedure, then you may not have a normal bowel movement for a few days.  DIET: Your first meal following the procedure should be a light meal and then it is ok to progress to your normal diet.  A half-sandwich or bowl of soup is an example of a good first meal.  Heavy or fried foods are harder to digest and may make you feel nauseous or bloated.  Likewise meals heavy in dairy and vegetables can cause extra gas to form and this can also increase the bloating.  Drink plenty of fluids but you should avoid alcoholic beverages for 24 hours.  ACTIVITY: Your care partner should take you home directly after the procedure.  You should plan to take it easy, moving slowly for the rest of the day.  You can resume normal activity the day after the procedure however you should NOT DRIVE or use heavy machinery for 24 hours (because of the sedation medicines used during the test).    SYMPTOMS TO REPORT IMMEDIATELY: A  gastroenterologist can be reached at any hour.  During normal business hours, 8:30 AM to 5:00 PM Monday through Friday, call [phone].  After hours and on weekends, please call the GI answering service at [phone] who will take a message and have the physician on call contact you.   Following lower endoscopy (colonoscopy or flexible sigmoidoscopy):  Excessive amounts of blood in the stool  Significant tenderness or worsening of abdominal pains  Swelling of the abdomen that is new, acute  Fever of 100F or higher FOLLOW UP: If any biopsies were taken you will be contacted by phone or by letter within the next 1-3 weeks.  Call your gastroenterologist if you have not heard about the biopsies in 3 weeks.  Our staff will call the home number listed on your records the next business day following your procedure to check on you and address any questions or concerns that you may have at that time regarding the information given to you following your procedure. This is a courtesy call and so if there is no answer at the home number and we have not heard from you through the emergency physician on call, we will assume that you have returned to your regular daily activities without incident.  SIGNATURES/CONFIDENTIALITY: You and/or your care partner have signed paperwork which will be entered into your electronic medical record.  These signatures attest to the fact that that the information above  on your After Visit Summary has been reviewed and is understood.  Full responsibility of the confidentiality of this discharge information lies with you and/or your care-partner.

## 2013-10-09 ENCOUNTER — Telehealth: Payer: Self-pay | Admitting: *Deleted

## 2013-10-09 NOTE — Telephone Encounter (Signed)
  Follow up Call-  Call back number 10/08/2013 10/09/2012  Post procedure Call Back phone  # [phone]  Permission to leave phone message Yes Yes     Patient questions:  Do you have a fever, pain , or abdominal swelling? No. Pain Score  0 *  Have you tolerated food without any problems? Yes.    Have you been able to return to your normal activities? Yes.    Do you have any questions about your discharge instructions: Diet   No. Medications  No. Follow up visit  No.  Do you have questions or concerns about your Care? No.  Actions: * If pain score is 4 or above: No action needed, pain <4.

## 2013-11-04 ENCOUNTER — Encounter (HOSPITAL_COMMUNITY): Payer: Self-pay | Admitting: Pharmacist

## 2013-11-05 ENCOUNTER — Encounter (HOSPITAL_COMMUNITY)
Admission: RE | Admit: 2013-11-05 | Discharge: 2013-11-05 | Disposition: A | Discharge status: Home / Self Care | Payer: Medicare Other | Source: Ambulatory Visit | Attending: Obstetrics and Gynecology | Admitting: Obstetrics and Gynecology

## 2013-11-05 ENCOUNTER — Encounter (HOSPITAL_COMMUNITY): Payer: Self-pay

## 2013-11-05 DIAGNOSIS — K589 Irritable bowel syndrome without diarrhea: Secondary | ICD-10-CM | POA: Diagnosis not present

## 2013-11-05 DIAGNOSIS — N858 Other specified noninflammatory disorders of uterus: Secondary | ICD-10-CM | POA: Diagnosis not present

## 2013-11-05 DIAGNOSIS — I10 Essential (primary) hypertension: Secondary | ICD-10-CM | POA: Diagnosis not present

## 2013-11-05 DIAGNOSIS — K2270 Barrett's esophagus without dysplasia: Secondary | ICD-10-CM | POA: Diagnosis not present

## 2013-11-05 DIAGNOSIS — M1990 Unspecified osteoarthritis, unspecified site: Secondary | ICD-10-CM | POA: Diagnosis not present

## 2013-11-05 DIAGNOSIS — R011 Cardiac murmur, unspecified: Secondary | ICD-10-CM | POA: Diagnosis not present

## 2013-11-05 DIAGNOSIS — K219 Gastro-esophageal reflux disease without esophagitis: Secondary | ICD-10-CM | POA: Diagnosis not present

## 2013-11-05 DIAGNOSIS — Z87891 Personal history of nicotine dependence: Secondary | ICD-10-CM | POA: Diagnosis not present

## 2013-11-05 DIAGNOSIS — N840 Polyp of corpus uteri: Secondary | ICD-10-CM | POA: Diagnosis present

## 2013-11-05 HISTORY — DX: Adverse effect of unspecified anesthetic, initial encounter: T41.45XA

## 2013-11-05 HISTORY — DX: Hematuria, unspecified: R31.9

## 2013-11-05 LAB — URINALYSIS, ROUTINE W REFLEX MICROSCOPIC
BILIRUBIN URINE: NEGATIVE
Glucose, UA: NEGATIVE mg/dL
KETONES UR: NEGATIVE mg/dL
Nitrite: NEGATIVE
PH: 5.5 (ref 5.0–8.0)
Protein, ur: NEGATIVE mg/dL
Specific Gravity, Urine: <1.005 — ABNORMAL LOW (ref 1.005–1.030)
Urobilinogen, UA: 0.2 mg/dL (ref 0.0–1.0)

## 2013-11-05 LAB — CBC
HEMATOCRIT: 36.7 % (ref 36.0–46.0)
HEMOGLOBIN: 12.2 g/dL (ref 12.0–15.0)
MCH: 30.3 pg (ref 26.0–34.0)
MCHC: 33.2 g/dL (ref 30.0–36.0)
MCV: 91.3 fL (ref 78.0–100.0)
Platelets: 317 10*3/uL (ref 150–400)
RBC: 4.02 MIL/uL (ref 3.87–5.11)
RDW: 14.8 % (ref 11.5–15.5)
WBC: 11.2 10*3/uL — ABNORMAL HIGH (ref 4.0–10.5)

## 2013-11-05 LAB — BASIC METABOLIC PANEL WITH GFR
Anion gap: 10 (ref 5–15)
BUN: 16 mg/dL (ref 6–23)
CO2: 27 meq/L (ref 19–32)
Calcium: 9.5 mg/dL (ref 8.4–10.5)
Chloride: 101 meq/L (ref 96–112)
Creatinine, Ser: 0.83 mg/dL (ref 0.50–1.10)
GFR calc Af Amer: 78 mL/min — ABNORMAL LOW
GFR calc non Af Amer: 67 mL/min — ABNORMAL LOW
Glucose, Bld: 113 mg/dL — ABNORMAL HIGH (ref 70–99)
Potassium: 4.2 meq/L (ref 3.7–5.3)
Sodium: 138 meq/L (ref 137–147)

## 2013-11-05 LAB — URINE MICROSCOPIC-ADD ON

## 2013-11-05 NOTE — Patient Instructions (Signed)
Your procedure is scheduled on:11/06/13  Enter through the Main Entrance at :[place] up desk phone and dial [phone] and inform us of your arrival.  Please call [phone] if you have any problems the morning of surgery.  Remember: Do not eat food or drink liquids, including water, after midnight:tonight    You may brush your teeth the morning of surgery.  Take these meds the morning of surgery with a sip of water:BP pill, antibiotic, and GERD pill  DO NOT wear jewelry, eye make-up, lipstick,body lotion, or dark fingernail polish.  (Polished toes are ok) You may wear deodorant.  If you are to be admitted after surgery, leave suitcase in car until your room has been assigned. Patients discharged on the day of surgery will not be allowed to drive home. Wear loose fitting, comfortable clothes for your ride home.

## 2013-11-06 ENCOUNTER — Encounter (HOSPITAL_COMMUNITY): Payer: Medicare Other | Admitting: Anesthesiology

## 2013-11-06 ENCOUNTER — Encounter (HOSPITAL_COMMUNITY)
Admission: RE | Disposition: A | Discharge status: Home / Self Care | Payer: Self-pay | Source: Ambulatory Visit | Attending: Obstetrics and Gynecology

## 2013-11-06 ENCOUNTER — Ambulatory Visit (HOSPITAL_COMMUNITY)
Admission: RE | Admit: 2013-11-06 | Discharge: 2013-11-06 | Disposition: A | Discharge status: Home / Self Care | Payer: Medicare Other | Source: Ambulatory Visit | Attending: Obstetrics and Gynecology | Admitting: Obstetrics and Gynecology

## 2013-11-06 ENCOUNTER — Ambulatory Visit (HOSPITAL_COMMUNITY): Payer: Medicare Other | Admitting: Anesthesiology

## 2013-11-06 DIAGNOSIS — N858 Other specified noninflammatory disorders of uterus: Secondary | ICD-10-CM | POA: Insufficient documentation

## 2013-11-06 DIAGNOSIS — R011 Cardiac murmur, unspecified: Secondary | ICD-10-CM | POA: Diagnosis not present

## 2013-11-06 DIAGNOSIS — N840 Polyp of corpus uteri: Secondary | ICD-10-CM | POA: Insufficient documentation

## 2013-11-06 DIAGNOSIS — K219 Gastro-esophageal reflux disease without esophagitis: Secondary | ICD-10-CM | POA: Insufficient documentation

## 2013-11-06 DIAGNOSIS — K589 Irritable bowel syndrome without diarrhea: Secondary | ICD-10-CM | POA: Insufficient documentation

## 2013-11-06 DIAGNOSIS — K2270 Barrett's esophagus without dysplasia: Secondary | ICD-10-CM | POA: Insufficient documentation

## 2013-11-06 DIAGNOSIS — I10 Essential (primary) hypertension: Secondary | ICD-10-CM | POA: Insufficient documentation

## 2013-11-06 DIAGNOSIS — Z87891 Personal history of nicotine dependence: Secondary | ICD-10-CM | POA: Insufficient documentation

## 2013-11-06 DIAGNOSIS — M1990 Unspecified osteoarthritis, unspecified site: Secondary | ICD-10-CM | POA: Insufficient documentation

## 2013-11-06 HISTORY — PX: HYSTEROSCOPY WITH D & C: SHX1775

## 2013-11-06 SURGERY — DILATATION AND CURETTAGE /HYSTEROSCOPY
Anesthesia: General | Site: Vagina

## 2013-11-06 MED ORDER — FENTANYL CITRATE 0.05 MG/ML IJ SOLN
INTRAMUSCULAR | Status: DC | PRN
  Administered 2013-11-06 (×2): 50 ug via INTRAVENOUS

## 2013-11-06 MED ORDER — DEXAMETHASONE SODIUM PHOSPHATE 10 MG/ML IJ SOLN
INTRAMUSCULAR | Status: DC | PRN
Start: 2013-11-06 — End: 2013-11-06
  Administered 2013-11-06: 4 mg via INTRAVENOUS

## 2013-11-06 MED ORDER — LACTATED RINGERS IV SOLN
INTRAVENOUS | Status: DC
  Administered 2013-11-06: 09:00:00 via INTRAVENOUS

## 2013-11-06 MED ORDER — PROPOFOL 10 MG/ML IV BOLUS
INTRAVENOUS | Status: DC | PRN
Start: 2013-11-06 — End: 2013-11-06
  Administered 2013-11-06: 100 mg via INTRAVENOUS

## 2013-11-06 MED ORDER — PROPOFOL 10 MG/ML IV EMUL
INTRAVENOUS | Status: AC
  Filled 2013-11-06: qty 20

## 2013-11-06 MED ORDER — PHENYLEPHRINE HCL 10 MG/ML IJ SOLN
INTRAMUSCULAR | Status: DC | PRN
Start: 2013-11-06 — End: 2013-11-06
  Administered 2013-11-06: 80 ug via INTRAVENOUS
  Administered 2013-11-06: 40 ug via INTRAVENOUS
  Administered 2013-11-06: 80 ug via INTRAVENOUS

## 2013-11-06 MED ORDER — FENTANYL CITRATE 0.05 MG/ML IJ SOLN
25.0000 ug | INTRAMUSCULAR | Status: DC | PRN
  Administered 2013-11-06: 25 ug via INTRAVENOUS
  Administered 2013-11-06: 50 ug via INTRAVENOUS

## 2013-11-06 MED ORDER — ONDANSETRON HCL 4 MG/2ML IJ SOLN: 4.0000 mg | Freq: Once | INTRAMUSCULAR | Status: DC | PRN

## 2013-11-06 MED ORDER — CEFAZOLIN SODIUM-DEXTROSE 2-3 GM-% IV SOLR
INTRAVENOUS | Status: DC | PRN
  Administered 2013-11-06: 2 g via INTRAVENOUS

## 2013-11-06 MED ORDER — ONDANSETRON HCL 4 MG/2ML IJ SOLN
INTRAMUSCULAR | Status: AC
  Filled 2013-11-06: qty 2

## 2013-11-06 MED ORDER — MEPERIDINE HCL 25 MG/ML IJ SOLN: 6.2500 mg | INTRAMUSCULAR | Status: DC | PRN

## 2013-11-06 MED ORDER — LIDOCAINE HCL (CARDIAC) 20 MG/ML IV SOLN
INTRAVENOUS | Status: AC
  Filled 2013-11-06: qty 5

## 2013-11-06 MED ORDER — FENTANYL CITRATE 0.05 MG/ML IJ SOLN
INTRAMUSCULAR | Status: AC
  Administered 2013-11-06: 25 ug via INTRAVENOUS
  Filled 2013-11-06: qty 2

## 2013-11-06 MED ORDER — DEXAMETHASONE SODIUM PHOSPHATE 4 MG/ML IJ SOLN
INTRAMUSCULAR | Status: AC
  Filled 2013-11-06: qty 1

## 2013-11-06 MED ORDER — FENTANYL CITRATE 0.05 MG/ML IJ SOLN
INTRAMUSCULAR | Status: AC
  Filled 2013-11-06: qty 5

## 2013-11-06 MED ORDER — ONDANSETRON HCL 4 MG/2ML IJ SOLN
INTRAMUSCULAR | Status: DC | PRN
  Administered 2013-11-06: 4 mg via INTRAVENOUS

## 2013-11-06 MED ORDER — LIDOCAINE HCL (CARDIAC) 20 MG/ML IV SOLN
INTRAVENOUS | Status: DC | PRN
  Administered 2013-11-06: 50 mg via INTRAVENOUS

## 2013-11-06 SURGICAL SUPPLY — 16 items
ABLATOR ENDOMETRIAL BIPOLAR (ABLATOR) IMPLANT
CANISTER SUCT 3000ML (MISCELLANEOUS) ×3 IMPLANT
CATH ROBINSON RED A/P 16FR (CATHETERS) ×3 IMPLANT
CLOTH BEACON ORANGE TIMEOUT ST (SAFETY) ×3 IMPLANT
CONTAINER PREFILL 10% NBF 60ML (FORM) ×6 IMPLANT
ELECT REM PT RETURN 9FT ADLT (ELECTROSURGICAL)
ELECTRODE REM PT RTRN 9FT ADLT (ELECTROSURGICAL) IMPLANT
GLOVE BIO SURGEON STRL SZ7 (GLOVE) ×3 IMPLANT
GOWN STRL REUS W/TWL LRG LVL3 (GOWN DISPOSABLE) ×6 IMPLANT
LOOP ANGLED CUTTING 22FR (CUTTING LOOP) IMPLANT
PACK VAGINAL MINOR WOMEN LF (CUSTOM PROCEDURE TRAY) ×3 IMPLANT
PAD OB MATERNITY 4.3X12.25 (PERSONAL CARE ITEMS) ×3 IMPLANT
SET TUBING HYSTEROSCOPY 2 NDL (TUBING) ×3 IMPLANT
TOWEL OR 17X24 6PK STRL BLUE (TOWEL DISPOSABLE) ×6 IMPLANT
TUBE HYSTEROSCOPY W Y-CONNECT (TUBING) ×3 IMPLANT
WATER STERILE IRR 1000ML POUR (IV SOLUTION) ×3 IMPLANT

## 2013-11-06 NOTE — Anesthesia Postprocedure Evaluation (Signed)
  Anesthesia Post-op Note  Patient: [name]  Procedure(s) Performed: Procedure(s): DILATATION AND CURETTAGE /HYSTEROSCOPY (N/A) Patient is awake and responsive. Pain and nausea are reasonably well controlled. Vital signs are stable and clinically acceptable. Oxygen saturation is clinically acceptable. There are no apparent anesthetic complications at this time. Patient is ready for discharge.

## 2013-11-06 NOTE — H&P (Signed)
73 y.o. yo complains of thickened endometrium and abdominal pain without bleeding. Pt. states she went to [facility] on 08/04/2013 for abdominal pain and has an U/S of pelvis which showed <1cm polyp with endo. thickness of 4.46m.  Pt. c/o intermittent pelvic throbbing pain which occurs every few days. Pt had CT and had abscess of diverticula- to follow up in 2 weeks with surgery about abscess. To also have Uro consult with cystoscopy as well for pain in bladder. No bleeding at all from vagina. Will have throbbing every couple days. Normal BM now. No blood in stool. No SA secondary husband.  Abd pain; uterine polyp and normal EM. No bleeding. AN by PCP in last 6 months. D/w pt options of EMB vs D&C, hysteroscopy. Desires D&C.  Now also has UTI- given antibiotics after culture done at hospital. Still no bleeding at all from vagina.  Past Medical History  Diagnosis Date  . Hypertension   . Celiac disease     per Dr. SFuller Plan is questionable.   . Diverticulitis     and diverticulosis in sigmoid colon   . Heart murmur   . IBS (irritable bowel syndrome)   . GERD (gastroesophageal reflux disease)   . Erosive esophagitis   . Degenerative joint disease   . Uterine polyp 08/06/2013  . Barrett's esophagus   . Hiatal hernia   . Complication of anesthesia 1966    adverse reaction to saddle block  . Blood in urine     has symptoms of UTI- burning and aching lower abdomen   Past Surgical History  Procedure Laterality Date  . Appendectomy    . Carpal tunnel release Left   . Tonsillectomy    . Knee arthroscopy Right   . Bunionectomy Right     History   Social History  . Marital Status: Married    Spouse Name: N/A    Number of Children: 2  . Years of Education: N/A   Occupational History  . retired    Social History Main Topics  . Smoking status: Former Smoker    Types: Cigarettes    Quit date: 03/18/1967  . Smokeless tobacco: Never Used  . Alcohol Use: No  . Drug Use: No  . Sexual  Activity: Not on file   Other Topics Concern  . Not on file   Social History Narrative   Lives at home, with husband. Has a son and daughter. Still ambulatory, still active. JKennyth Losedaughter    [phone]   No current facility-administered medications on file prior to encounter.   Current Outpatient Prescriptions on File Prior to Encounter  Medication Sig Dispense Refill  . acidophilus (RISAQUAD) CAPS Take 1 capsule by mouth daily.      .Marland KitchenamLODipine (NORVASC) 5 MG tablet Take 5 mg by mouth 2 (two) times daily.       . cycloSPORINE (RESTASIS) 0.05 % ophthalmic emulsion Place 1 drop into both eyes 2 (two) times daily.      .Marland Kitchendesonide (DESOWEN) 0.05 % cream Apply 1 application topically 2 (two) times daily as needed.       .Marland Kitchenlosartan (COZAAR) 100 MG tablet Take 1 tablet (100 mg total) by mouth daily.  30 tablet  0  . metoprolol succinate (TOPROL-XL) 50 MG 24 hr tablet Take 50 mg by mouth daily. Take with or immediately following a meal.      . pantoprazole (PROTONIX) 40 MG tablet Take 40 mg by mouth 2 (two) times daily.      .Marland Kitchen  aspirin EC 81 MG tablet Take 81 mg by mouth daily.        Allergies  Allergen Reactions  . Gluten Meal     Diarrhea, vomiting that lasts 3-4 hours  . Ace Inhibitors     Coughing  . Ciprofloxacin     Joint pain  . Ibuprofen     Joint pain  . Levofloxacin     Pain in tendons  . Simvastatin Other (See Comments)    Muscle pain    Filed Vitals:   11/06/13 0807 11/06/13 0813  BP:  117/49  Pulse:  83  Temp: 99.1 F (37.3 C)   TempSrc: Oral Oral  Resp:  16  SpO2:  93%    Lungs: clear to ascultation Cor:  RRR Abdomen:  soft, nontender, nondistended. Ex:  no cords, erythema Pelvic:  Normal efg, normal s/s uterus without masses felt  A:  Thickened endometrium consistent with polyp and abdominal pain.  Pt has not had bleeding but will need investigation of tissue and fluid in EM canal.     P:  All risks, benefits and alternatives d/w patient and  she desires to proceed.  Patient will have SCDs during the operation.     Denishia Citro A

## 2013-11-06 NOTE — Discharge Instructions (Signed)
DISCHARGE INSTRUCTIONS: D&C  The following instructions have been prepared to help you care for yourself upon your return home.   Personal hygiene:  Use sanitary pads for vaginal drainage, not tampons.  Shower the day after your procedure.  NO tub baths, pools or Jacuzzis for 2-3 weeks.  Wipe front to back after using the bathroom.  Activity and limitations:  Do NOT drive or operate any equipment for 24 hours. The effects of anesthesia are still present and drowsiness may result.  Do NOT rest in bed all day.  Walking is encouraged.  Walk up and down stairs slowly.  You may resume your normal activity in one to two days or as indicated by your physician.  Sexual activity: NO intercourse for at least 2 weeks after the procedure, or as indicated by your physician.  Diet: Eat a light meal as desired this evening. You may resume your usual diet tomorrow.  Return to work: You may resume your work activities in one to two days or as indicated by your doctor.  What to expect after your surgery: Expect to have vaginal bleeding/discharge for 2-3 days and spotting for up to 10 days. It is not unusual to have soreness for up to 1-2 weeks. You may have a slight burning sensation when you urinate for the first day. Mild cramps may continue for a couple of days. You may have a regular period in 2-6 weeks.  Call your doctor for any of the following:  Excessive vaginal bleeding, saturating and changing one pad every hour.  Inability to urinate 6 hours after discharge from hospital.  Pain not relieved by pain medication.  Fever of 100.4 F or greater.  Unusual vaginal discharge or odor.   Call for an appointment:    Patients signature: ______________________  Nurses signature ________________________  Support person's signature_______________________

## 2013-11-06 NOTE — Transfer of Care (Signed)
Immediate Anesthesia Transfer of Care Note  Patient: [name]  Procedure(s) Performed: Procedure(s): DILATATION AND CURETTAGE /HYSTEROSCOPY (N/A)  Patient Location: PACU  Anesthesia Type:General  Level of Consciousness: awake, alert  and oriented  Airway & Oxygen Therapy: Patient Spontanous Breathing and Patient connected to nasal cannula oxygen  Post-op Assessment: Report given to PACU RN and Post -op Vital signs reviewed and stable  Post vital signs: Reviewed and stable  Complications: No apparent anesthesia complications

## 2013-11-06 NOTE — Anesthesia Preprocedure Evaluation (Signed)
Anesthesia Evaluation  Patient identified by MRN, date of birth, ID band Patient awake    Reviewed: Allergy & Precautions, H&P , Patient's Chart, lab work & pertinent test results, reviewed documented beta blocker date and time   Airway Mallampati: II  TM Distance: >3 FB Neck ROM: full    Dental no notable dental hx.    Pulmonary former smoker,  breath sounds clear to auscultation  Pulmonary exam normal       Cardiovascular hypertension (EKG normal), On Medications Rhythm:regular Rate:Normal     Neuro/Psych    GI/Hepatic GERD-  Medicated,  Endo/Other    Renal/GU      Musculoskeletal   Abdominal   Peds  Hematology   Anesthesia Other Findings   Reproductive/Obstetrics                             Anesthesia Physical Anesthesia Plan  ASA: II  Anesthesia Plan:    Post-op Pain Management:    Induction: Intravenous  Airway Management Planned: LMA  Additional Equipment:   Intra-op Plan:   Post-operative Plan:   Informed Consent: I have reviewed the patients History and Physical, chart, labs and discussed the procedure including the risks, benefits and alternatives for the proposed anesthesia with the patient or authorized representative who has indicated his/her understanding and acceptance.   Dental Advisory Given and Dental advisory given  Plan Discussed with: CRNA and Surgeon  Anesthesia Plan Comments: (Discussed GA with LMA, possible sore throat, potential need to switch to ETT, N/V, pulmonary aspiration. Questions answered. )        Anesthesia Quick Evaluation

## 2013-11-06 NOTE — Op Note (Signed)
11/06/2013  9:53 AM  PATIENT:  [name]  73 y.o. female  PRE-OPERATIVE DIAGNOSIS:  Endometrial polyp, thickened endometrium  POST-OPERATIVE DIAGNOSIS:  Benign endometrial cavity  PROCEDURE:  Procedure(s): DILATATION AND CURETTAGE /HYSTEROSCOPY (N/A)  SURGEON:  Surgeon(s) and Role:    * Daria Pastures, MD - Primary   ANESTHESIA:   general  EBL:   min  Hysteroscopy deficit: 5cc  SPECIMEN:  Source of Specimen:  uterine curettings  DISPOSITION OF SPECIMEN:  PATHOLOGY  COUNTS:  YES  TOURNIQUET:  * No tourniquets in log *  DICTATION: .Note written in EPIC  PLAN OF CARE: Discharge to home after PACU  PATIENT DISPOSITION:  PACU - hemodynamically stable.   Delay start of Pharmacological VTE agent (>24hrs) due to surgical blood loss or risk of bleeding: not applicable  Complications: small perforation of right fundus with dilators.  No bleeding seen and deficit kept to minimum.   Findings: Small perforation but otherwise normal endometrial cavity.  No polyps seen.  Scant and atrophic endometrium.  Pt tolerated procedure and there was no bleeding.  Ancef given for perforation. Cath urine sent for culture.  Meds: Ancef 2 gm.  Technique:  After adequate anesthesia was achieved, the patient was prepped and draped in the usual sterile fashion.  The speculum was placed in the vagina and the cervix stabilized with a single-tooth tenaculum.  The cervix was dilated with pratt dilators and the hysteroscope passed inside the endometrial cavity.  The above findings were noted.  Sharp curettage was then performed and uterine curettings sent to path.  All instruments were removed from the vagina.  The patient tolerated the procedure well.    Quincey Nored A

## 2013-11-06 NOTE — Brief Op Note (Signed)
11/06/2013  9:53 AM  PATIENT:  [name]  73 y.o. female  PRE-OPERATIVE DIAGNOSIS:  Endometrial polyp, thickened endometrium  POST-OPERATIVE DIAGNOSIS:  Benign endometrial cavity  PROCEDURE:  Procedure(s): DILATATION AND CURETTAGE /HYSTEROSCOPY (N/A)  SURGEON:  Surgeon(s) and Role:    * Daria Pastures, MD - Primary   ANESTHESIA:   general  EBL:   min  Hysteroscopy deficit: 5cc  SPECIMEN:  Source of Specimen:  uterine curettings  DISPOSITION OF SPECIMEN:  PATHOLOGY  COUNTS:  YES  TOURNIQUET:  * No tourniquets in log *  DICTATION: .Note written in EPIC  PLAN OF CARE: Discharge to home after PACU  PATIENT DISPOSITION:  PACU - hemodynamically stable.   Delay start of Pharmacological VTE agent (>24hrs) due to surgical blood loss or risk of bleeding: not applicable  Complications: small perforation of right fundus with dilators.  No bleeding seen and deficit kept to minimum.   Findings: Small perforation but otherwise normal endometrial cavity.  No polyps seen.  Scant and atrophic endometrium.  Pt tolerated procedure and there was no bleeding.  Ancef given for perforation. Cath urine sent for culture.  Meds: Ancef 2 gm.  Technique:  After adequate anesthesia was achieved, the patient was prepped and draped in the usual sterile fashion.  The speculum was placed in the vagina and the cervix stabilized with a single-tooth tenaculum.  The cervix was dilated with pratt dilators and the hysteroscope passed inside the endometrial cavity.  The above findings were noted.  Sharp curettage was then performed and uterine curettings sent to path.  All instruments were removed from the vagina.  The patient tolerated the procedure well.    Rey Fors A

## 2013-11-07 ENCOUNTER — Encounter (HOSPITAL_COMMUNITY): Payer: Self-pay | Admitting: Obstetrics and Gynecology

## 2013-11-07 LAB — URINE CULTURE: Colony Count: 10000

## 2013-12-16 ENCOUNTER — Other Ambulatory Visit: Payer: Self-pay | Admitting: Urology

## 2013-12-16 NOTE — Progress Notes (Signed)
Please put orders in Epic surgery 12-23-13 pre op 12-17-13 Thanks

## 2013-12-17 ENCOUNTER — Ambulatory Visit (HOSPITAL_COMMUNITY)
Admission: RE | Admit: 2013-12-17 | Discharge: 2013-12-17 | Disposition: A | Discharge status: Home / Self Care | Payer: Medicare Other | Source: Ambulatory Visit | Attending: Anesthesiology | Admitting: Anesthesiology

## 2013-12-17 ENCOUNTER — Encounter (HOSPITAL_COMMUNITY): Payer: Self-pay

## 2013-12-17 ENCOUNTER — Encounter (HOSPITAL_COMMUNITY)
Admission: RE | Admit: 2013-12-17 | Discharge: 2013-12-17 | Disposition: A | Discharge status: Home / Self Care | Payer: Medicare Other | Source: Ambulatory Visit | Attending: Urology | Admitting: Urology

## 2013-12-17 DIAGNOSIS — Z01818 Encounter for other preprocedural examination: Secondary | ICD-10-CM | POA: Insufficient documentation

## 2013-12-17 DIAGNOSIS — I10 Essential (primary) hypertension: Secondary | ICD-10-CM

## 2013-12-17 LAB — CBC
HEMATOCRIT: 36.7 % (ref 36.0–46.0)
Hemoglobin: 12 g/dL (ref 12.0–15.0)
MCH: 30.3 pg (ref 26.0–34.0)
MCHC: 32.7 g/dL (ref 30.0–36.0)
MCV: 92.7 fL (ref 78.0–100.0)
Platelets: 256 10*3/uL (ref 150–400)
RBC: 3.96 MIL/uL (ref 3.87–5.11)
RDW: 14.1 % (ref 11.5–15.5)
WBC: 5.6 10*3/uL (ref 4.0–10.5)

## 2013-12-17 LAB — BASIC METABOLIC PANEL
ANION GAP: 12 (ref 5–15)
BUN: 12 mg/dL (ref 6–23)
CHLORIDE: 105 meq/L (ref 96–112)
CO2: 24 mEq/L (ref 19–32)
Calcium: 10.1 mg/dL (ref 8.4–10.5)
Creatinine, Ser: 0.79 mg/dL (ref 0.50–1.10)
GFR calc non Af Amer: 79 mL/min — ABNORMAL LOW (ref >90)
Glucose, Bld: 108 mg/dL — ABNORMAL HIGH (ref 70–99)
Potassium: 5.3 mEq/L (ref 3.7–5.3)
Sodium: 141 mEq/L (ref 137–147)

## 2013-12-17 NOTE — Pre-Procedure Instructions (Signed)
EKG REPORT IN EPIC FROM 11-05-13. CXR WAS DONE TODAY PREOP AT [facility] AS PER ANESTHESIOLOGIST'S GUIDELINES. CHART TO FOLLOW UP NURSE BEVERY NANNY, RN FOR REVIEW OF ALL PREOP TEST RESULTS.

## 2013-12-17 NOTE — Patient Instructions (Addendum)
YOUR SURGERY IS SCHEDULED AT [facility]  ON:  Monday  December 14th  REPORT TO  SHORT STAY CENTER AT:  9:30 AM   DO NOT EAT OR DRINK ANYTHING AFTER MIDNIGHT THE NIGHT BEFORE YOUR SURGERY.  YOU MAY BRUSH YOUR TEETH, RINSE OUT YOUR MOUTH--BUT NO WATER, NO FOOD, NO CHEWING GUM, NO MINTS, NO CANDIES, NO CHEWING TOBACCO.  PLEASE TAKE THE FOLLOWING MEDICATIONS THE AM OF YOUR SURGERY WITH A FEW SIPS OF WATER:  AMLODIPINE, METOPROLOL, PROTONIX.  YOU MAY USE YOUR RESTASIS EYE DROPS - AND BRING YOUR EYE DROPS.    DO NOT BRING VALUABLES, MONEY, CREDIT CARDS.  DO NOT WEAR JEWELRY, MAKE-UP, NAIL POLISH AND NO METAL PINS OR CLIPS IN YOUR HAIR. CONTACT LENS, DENTURES / PARTIALS, GLASSES SHOULD NOT BE WORN TO SURGERY AND IN MOST CASES-HEARING AIDS WILL NEED TO BE REMOVED.  BRING YOUR GLASSES CASE, ANY EQUIPMENT NEEDED FOR YOUR CONTACT LENS. FOR PATIENTS ADMITTED TO THE HOSPITAL--CHECK OUT TIME THE DAY OF DISCHARGE IS 11:00 AM.  ALL INPATIENT ROOMS ARE PRIVATE - WITH BATHROOM, TELEPHONE, TELEVISION AND WIFI INTERNET.      PLEASE BE AWARE THAT YOU MAY NEED ADDITIONAL BLOOD DRAWN DAY OF YOUR SURGERY  _______________________________________________________________________   [facility] - Preparing for Surgery Before surgery, you can play an important role.  Because skin is not sterile, your skin needs to be as free of germs as possible.  You can reduce the number of germs on your skin by washing with CHG (chlorahexidine gluconate) soap before surgery.  CHG is an antiseptic cleaner which kills germs and bonds with the skin to continue killing germs even after washing. Please DO NOT use if you have an allergy to CHG or antibacterial soaps.  If your skin becomes reddened/irritated stop using the CHG and inform your nurse when you arrive at Short Stay. Do not shave (including legs and underarms) for at least 48 hours prior to the first CHG shower.  You may shave your face/neck. Please follow  these instructions carefully:  1.  Shower with CHG Soap the night before surgery and the  morning of Surgery.  2.  If you choose to wash your hair, wash your hair first as usual with your  normal  shampoo.  3.  After you shampoo, rinse your hair and body thoroughly to remove the  shampoo.                           4.  Use CHG as you would any other liquid soap.  You can apply chg directly  to the skin and wash                       Gently with a scrungie or clean washcloth.  5.  Apply the CHG Soap to your body ONLY FROM THE NECK DOWN.   Do not use on face/ open                           Wound or open sores. Avoid contact with eyes, ears mouth and genitals (private parts).                       Wash face,  Genitals (private parts) with your normal soap.             6.  Wash thoroughly, paying special attention to the  area where your surgery  will be performed.  7.  Thoroughly rinse your body with warm water from the neck down.  8.  DO NOT shower/wash with your normal soap after using and rinsing off  the CHG Soap.                9.  Pat yourself dry with a clean towel.            10.  Wear clean pajamas.            11.  Place clean sheets on your bed the night of your first shower and do not  sleep with pets. Day of Surgery : Do not apply any lotions/deodorants the morning of surgery.  Please wear clean clothes to the hospital/surgery center.  FAILURE TO FOLLOW THESE INSTRUCTIONS MAY RESULT IN THE CANCELLATION OF YOUR SURGERY PATIENT SIGNATURE_________________________________  NURSE SIGNATURE__________________________________  ________________________________________________________________________   Adam Phenix  An incentive spirometer is a tool that can help keep your lungs clear and active. This tool measures how well you are filling your lungs with each breath. Taking long deep breaths may help reverse or decrease the chance of developing breathing (pulmonary) problems  (especially infection) following:  A long period of time when you are unable to move or be active. BEFORE THE PROCEDURE   If the spirometer includes an indicator to show your best effort, your nurse or respiratory therapist will set it to a desired goal.  If possible, sit up straight or lean slightly forward. Try not to slouch.  Hold the incentive spirometer in an upright position. INSTRUCTIONS FOR USE  1. Sit on the edge of your bed if possible, or sit up as far as you can in bed or on a chair. 2. Hold the incentive spirometer in an upright position. 3. Breathe out normally. 4. Place the mouthpiece in your mouth and seal your lips tightly around it. 5. Breathe in slowly and as deeply as possible, raising the piston or the ball toward the top of the column. 6. Hold your breath for 3-5 seconds or for as long as possible. Allow the piston or ball to fall to the bottom of the column. 7. Remove the mouthpiece from your mouth and breathe out normally. 8. Rest for a few seconds and repeat Steps 1 through 7 at least 10 times every 1-2 hours when you are awake. Take your time and take a few normal breaths between deep breaths. 9. The spirometer may include an indicator to show your best effort. Use the indicator as a goal to work toward during each repetition. 10. After each set of 10 deep breaths, practice coughing to be sure your lungs are clear. If you have an incision (the cut made at the time of surgery), support your incision when coughing by placing a pillow or rolled up towels firmly against it. Once you are able to get out of bed, walk around indoors and cough well. You may stop using the incentive spirometer when instructed by your caregiver.  RISKS AND COMPLICATIONS  Take your time so you do not get dizzy or light-headed.  If you are in pain, you may need to take or ask for pain medication before doing incentive spirometry. It is harder to take a deep breath if you are having  pain. AFTER USE  Rest and breathe slowly and easily.  It can be helpful to keep track of a log of your progress. Your caregiver can provide you with a  simple table to help with this. If you are using the spirometer at home, follow these instructions: [place] IF:   You are having difficultly using the spirometer.  You have trouble using the spirometer as often as instructed.  Your pain medication is not giving enough relief while using the spirometer.  You develop fever of 100.5 F (38.1 C) or higher. SEEK IMMEDIATE MEDICAL CARE IF:   You cough up bloody sputum that had not been present before.  You develop fever of 102 F (38.9 C) or greater.  You develop worsening pain at or near the incision site. MAKE SURE YOU:   Understand these instructions.  Will watch your condition.  Will get help right away if you are not doing well or get worse. Document Released: 05/09/2006 Document Revised: 03/21/2011 Document Reviewed: 07/10/2006 [facility] Patient Information 2014 [place], [place].   ________________________________________________________________________

## 2013-12-23 ENCOUNTER — Encounter (HOSPITAL_COMMUNITY): Payer: Self-pay | Admitting: *Deleted

## 2013-12-23 ENCOUNTER — Ambulatory Visit (HOSPITAL_COMMUNITY): Payer: Medicare Other | Admitting: Anesthesiology

## 2013-12-23 ENCOUNTER — Encounter (HOSPITAL_COMMUNITY)
Admission: RE | Disposition: A | Discharge status: Home / Self Care | Payer: Self-pay | Source: Ambulatory Visit | Attending: Urology

## 2013-12-23 ENCOUNTER — Observation Stay (HOSPITAL_COMMUNITY)
Admission: RE | Admit: 2013-12-23 | Discharge: 2013-12-24 | Disposition: A | Discharge status: Home / Self Care | Payer: Medicare Other | Source: Ambulatory Visit | Attending: Urology | Admitting: Urology

## 2013-12-23 DIAGNOSIS — Z79899 Other long term (current) drug therapy: Secondary | ICD-10-CM | POA: Insufficient documentation

## 2013-12-23 DIAGNOSIS — N3289 Other specified disorders of bladder: Secondary | ICD-10-CM | POA: Diagnosis present

## 2013-12-23 DIAGNOSIS — N329 Bladder disorder, unspecified: Secondary | ICD-10-CM | POA: Diagnosis not present

## 2013-12-23 DIAGNOSIS — E780 Pure hypercholesterolemia: Secondary | ICD-10-CM | POA: Insufficient documentation

## 2013-12-23 DIAGNOSIS — Z7982 Long term (current) use of aspirin: Secondary | ICD-10-CM | POA: Insufficient documentation

## 2013-12-23 DIAGNOSIS — M1990 Unspecified osteoarthritis, unspecified site: Secondary | ICD-10-CM | POA: Insufficient documentation

## 2013-12-23 DIAGNOSIS — K5730 Diverticulosis of large intestine without perforation or abscess without bleeding: Secondary | ICD-10-CM | POA: Insufficient documentation

## 2013-12-23 DIAGNOSIS — Z87891 Personal history of nicotine dependence: Secondary | ICD-10-CM | POA: Diagnosis not present

## 2013-12-23 DIAGNOSIS — K219 Gastro-esophageal reflux disease without esophagitis: Secondary | ICD-10-CM | POA: Diagnosis not present

## 2013-12-23 DIAGNOSIS — I10 Essential (primary) hypertension: Secondary | ICD-10-CM | POA: Insufficient documentation

## 2013-12-23 DIAGNOSIS — N3021 Other chronic cystitis with hematuria: Secondary | ICD-10-CM | POA: Diagnosis not present

## 2013-12-23 DIAGNOSIS — K900 Celiac disease: Secondary | ICD-10-CM | POA: Insufficient documentation

## 2013-12-23 HISTORY — PX: CYSTOSCOPY W/ RETROGRADES: SHX1426

## 2013-12-23 HISTORY — PX: TRANSURETHRAL RESECTION OF BLADDER TUMOR: SHX2575

## 2013-12-23 SURGERY — CYSTOSCOPY, WITH RETROGRADE PYELOGRAM
Anesthesia: General

## 2013-12-23 MED ORDER — ACETAMINOPHEN 325 MG PO TABS: 650.0000 mg | ORAL_TABLET | ORAL | Status: DC | PRN

## 2013-12-23 MED ORDER — ONDANSETRON HCL 4 MG/2ML IJ SOLN
4.0000 mg | INTRAMUSCULAR | Status: DC | PRN
  Administered 2013-12-23: 4 mg via INTRAVENOUS
  Filled 2013-12-23: qty 2

## 2013-12-23 MED ORDER — METOPROLOL SUCCINATE ER 50 MG PO TB24
50.0000 mg | ORAL_TABLET | Freq: Every morning | ORAL | Status: DC
  Administered 2013-12-24: 50 mg via ORAL
  Filled 2013-12-23: qty 1

## 2013-12-23 MED ORDER — LOSARTAN POTASSIUM 50 MG PO TABS: 100.0000 mg | ORAL_TABLET | Freq: Every day | ORAL | Status: DC

## 2013-12-23 MED ORDER — BACITRACIN-NEOMYCIN-POLYMYXIN 400-5-5000 EX OINT: 1.0000 "application " | TOPICAL_OINTMENT | Freq: Three times a day (TID) | CUTANEOUS | Status: DC | PRN

## 2013-12-23 MED ORDER — DEXAMETHASONE SODIUM PHOSPHATE 10 MG/ML IJ SOLN
INTRAMUSCULAR | Status: AC
  Filled 2013-12-23: qty 1

## 2013-12-23 MED ORDER — PROMETHAZINE HCL 25 MG/ML IJ SOLN
12.5000 mg | Freq: Four times a day (QID) | INTRAMUSCULAR | Status: DC | PRN
  Administered 2013-12-23: 12.5 mg via INTRAVENOUS
  Filled 2013-12-23: qty 1

## 2013-12-23 MED ORDER — ONDANSETRON HCL 4 MG/2ML IJ SOLN
INTRAMUSCULAR | Status: AC
  Filled 2013-12-23: qty 2

## 2013-12-23 MED ORDER — TRIMETHOPRIM 100 MG PO TABS
200.0000 mg | ORAL_TABLET | Freq: Every day | ORAL | Status: DC
  Filled 2013-12-23 (×2): qty 2

## 2013-12-23 MED ORDER — PHENYLEPHRINE HCL 10 MG/ML IJ SOLN
INTRAMUSCULAR | Status: DC | PRN
  Administered 2013-12-23: 40 ug via INTRAVENOUS
  Administered 2013-12-23: 80 ug via INTRAVENOUS

## 2013-12-23 MED ORDER — HYDROMORPHONE HCL 1 MG/ML IJ SOLN
INTRAMUSCULAR | Status: AC
  Filled 2013-12-23: qty 1

## 2013-12-23 MED ORDER — OXYCODONE HCL 5 MG PO TABS: 5.0000 mg | ORAL_TABLET | Freq: Once | ORAL | Status: DC | PRN

## 2013-12-23 MED ORDER — FENTANYL CITRATE 0.05 MG/ML IJ SOLN
INTRAMUSCULAR | Status: AC
  Filled 2013-12-23: qty 2

## 2013-12-23 MED ORDER — PROPOFOL 10 MG/ML IV BOLUS
INTRAVENOUS | Status: AC
  Filled 2013-12-23: qty 20

## 2013-12-23 MED ORDER — LACTATED RINGERS IV SOLN
INTRAVENOUS | Status: DC
  Administered 2013-12-23: 1000 mL via INTRAVENOUS
  Administered 2013-12-23: 14:00:00 via INTRAVENOUS

## 2013-12-23 MED ORDER — HYDROMORPHONE HCL 1 MG/ML IJ SOLN
0.5000 mg | INTRAMUSCULAR | Status: DC | PRN
  Administered 2013-12-23: 1 mg via INTRAVENOUS
  Filled 2013-12-23: qty 1

## 2013-12-23 MED ORDER — OXYCODONE-ACETAMINOPHEN 5-325 MG PO TABS: 1.0000 | ORAL_TABLET | ORAL | Status: DC | PRN

## 2013-12-23 MED ORDER — IOHEXOL 300 MG/ML  SOLN
INTRAMUSCULAR | Status: DC | PRN
  Administered 2013-12-23: 17 mL via URETHRAL

## 2013-12-23 MED ORDER — MEPERIDINE HCL 50 MG/ML IJ SOLN: 6.2500 mg | INTRAMUSCULAR | Status: DC | PRN

## 2013-12-23 MED ORDER — POTASSIUM CHLORIDE IN NACL 20-0.45 MEQ/L-% IV SOLN
INTRAVENOUS | Status: DC
  Administered 2013-12-23: 16:00:00 via INTRAVENOUS
  Filled 2013-12-23 (×2): qty 1000

## 2013-12-23 MED ORDER — PANTOPRAZOLE SODIUM 40 MG PO TBEC
40.0000 mg | DELAYED_RELEASE_TABLET | Freq: Two times a day (BID) | ORAL | Status: DC
  Administered 2013-12-23 – 2013-12-24 (×2): 40 mg via ORAL
  Filled 2013-12-23 (×3): qty 1

## 2013-12-23 MED ORDER — OXYCODONE HCL 5 MG/5ML PO SOLN
5.0000 mg | Freq: Once | ORAL | Status: DC | PRN
  Filled 2013-12-23: qty 5

## 2013-12-23 MED ORDER — FENTANYL CITRATE 0.05 MG/ML IJ SOLN
INTRAMUSCULAR | Status: DC | PRN
  Administered 2013-12-23: 25 ug via INTRAVENOUS
  Administered 2013-12-23 (×3): 50 ug via INTRAVENOUS
  Administered 2013-12-23: 25 ug via INTRAVENOUS

## 2013-12-23 MED ORDER — CYCLOSPORINE 0.05 % OP EMUL
1.0000 [drp] | Freq: Two times a day (BID) | OPHTHALMIC | Status: DC
  Administered 2013-12-23 – 2013-12-24 (×2): 1 [drp] via OPHTHALMIC
  Filled 2013-12-23 (×3): qty 1

## 2013-12-23 MED ORDER — LOSARTAN POTASSIUM 50 MG PO TABS
100.0000 mg | ORAL_TABLET | Freq: Every day | ORAL | Status: DC
Start: 2013-12-23 — End: 2013-12-24
  Administered 2013-12-24: 100 mg via ORAL
  Filled 2013-12-23 (×2): qty 2

## 2013-12-23 MED ORDER — SENNOSIDES-DOCUSATE SODIUM 8.6-50 MG PO TABS
2.0000 | ORAL_TABLET | Freq: Every day | ORAL | Status: DC
  Administered 2013-12-23: 2 via ORAL
  Filled 2013-12-23: qty 2

## 2013-12-23 MED ORDER — PROMETHAZINE HCL 25 MG/ML IJ SOLN: 6.2500 mg | INTRAMUSCULAR | Status: DC | PRN

## 2013-12-23 MED ORDER — HYOSCYAMINE SULFATE 0.125 MG SL SUBL
0.1250 mg | SUBLINGUAL_TABLET | SUBLINGUAL | Status: DC | PRN
  Filled 2013-12-23: qty 1

## 2013-12-23 MED ORDER — CEFAZOLIN SODIUM-DEXTROSE 2-3 GM-% IV SOLR
2.0000 g | INTRAVENOUS | Status: AC
  Administered 2013-12-23: 2 g via INTRAVENOUS

## 2013-12-23 MED ORDER — RISAQUAD PO CAPS
1.0000 | ORAL_CAPSULE | Freq: Every evening | ORAL | Status: DC
  Filled 2013-12-23 (×2): qty 1

## 2013-12-23 MED ORDER — LIDOCAINE HCL (CARDIAC) 20 MG/ML IV SOLN
INTRAVENOUS | Status: DC | PRN
  Administered 2013-12-23: 50 mg via INTRAVENOUS

## 2013-12-23 MED ORDER — ONDANSETRON HCL 4 MG/2ML IJ SOLN
INTRAMUSCULAR | Status: DC | PRN
Start: 2013-12-23 — End: 2013-12-23
  Administered 2013-12-23: 4 mg via INTRAVENOUS

## 2013-12-23 MED ORDER — HYDROMORPHONE HCL 1 MG/ML IJ SOLN
0.2500 mg | INTRAMUSCULAR | Status: DC | PRN
  Administered 2013-12-23 (×6): 0.25 mg via INTRAVENOUS

## 2013-12-23 MED ORDER — POLYETHYLENE GLYCOL 3350 17 G PO PACK
17.0000 g | PACK | Freq: Every day | ORAL | Status: DC | PRN
  Filled 2013-12-23: qty 1

## 2013-12-23 MED ORDER — CEFAZOLIN SODIUM-DEXTROSE 2-3 GM-% IV SOLR
INTRAVENOUS | Status: AC
  Filled 2013-12-23: qty 50

## 2013-12-23 MED ORDER — PROPOFOL 10 MG/ML IV BOLUS
INTRAVENOUS | Status: DC | PRN
  Administered 2013-12-23: 160 mg via INTRAVENOUS

## 2013-12-23 MED ORDER — PHENYLEPHRINE 40 MCG/ML (10ML) SYRINGE FOR IV PUSH (FOR BLOOD PRESSURE SUPPORT)
PREFILLED_SYRINGE | INTRAVENOUS | Status: AC
  Filled 2013-12-23: qty 10

## 2013-12-23 MED ORDER — SODIUM CHLORIDE 0.9 % IR SOLN
Status: DC | PRN
  Administered 2013-12-23: 9000 mL

## 2013-12-23 MED ORDER — LIDOCAINE HCL (CARDIAC) 20 MG/ML IV SOLN
INTRAVENOUS | Status: AC
  Filled 2013-12-23: qty 5

## 2013-12-23 MED ORDER — AMLODIPINE BESYLATE 5 MG PO TABS
5.0000 mg | ORAL_TABLET | Freq: Two times a day (BID) | ORAL | Status: DC
  Administered 2013-12-23 – 2013-12-24 (×2): 5 mg via ORAL
  Filled 2013-12-23 (×3): qty 1

## 2013-12-23 MED ORDER — DEXAMETHASONE SODIUM PHOSPHATE 10 MG/ML IJ SOLN
INTRAMUSCULAR | Status: DC | PRN
  Administered 2013-12-23: 10 mg via INTRAVENOUS

## 2013-12-23 SURGICAL SUPPLY — 21 items
BAG URINE DRAINAGE (UROLOGICAL SUPPLIES) IMPLANT
BAG URO CATCHER STRL LF (DRAPE) ×4 IMPLANT
CATH HEMATURIA 20FR (CATHETERS) ×4 IMPLANT
DRAPE CAMERA CLOSED 9X96 (DRAPES) ×4 IMPLANT
ELECT BUTTON HF 24-28F 2 30DE (ELECTRODE) IMPLANT
ELECT LOOP MED HF 24F 12D (CUTTING LOOP) IMPLANT
ELECT LOOP MED HF 24F 12D CBL (CLIP) ×4 IMPLANT
ELECT RESECT VAPORIZE 12D CBL (ELECTRODE) IMPLANT
GLOVE BIOGEL M STRL SZ7.5 (GLOVE) ×20 IMPLANT
GOWN STRL REUS W/TWL LRG LVL3 (GOWN DISPOSABLE) ×8 IMPLANT
GOWN STRL REUS W/TWL XL LVL3 (GOWN DISPOSABLE) ×4 IMPLANT
HOLDER FOLEY CATH W/STRAP (MISCELLANEOUS) IMPLANT
IV NS IRRIG 3000ML ARTHROMATIC (IV SOLUTION) IMPLANT
KIT ASPIRATION TUBING (SET/KITS/TRAYS/PACK) IMPLANT
MANIFOLD NEPTUNE II (INSTRUMENTS) ×4 IMPLANT
NS IRRIG 1000ML POUR BTL (IV SOLUTION) ×4 IMPLANT
PACK CYSTO (CUSTOM PROCEDURE TRAY) ×4 IMPLANT
SCRUB PCMX 4 OZ (MISCELLANEOUS) IMPLANT
SYRINGE IRR TOOMEY STRL 70CC (SYRINGE) ×4 IMPLANT
TUBING CONNECTING 10 (TUBING) ×3 IMPLANT
TUBING CONNECTING 10' (TUBING) ×1

## 2013-12-23 NOTE — H&P (Signed)
Reason For Visit Cystoscopy   Active Problems Problems  1. Chronic cystitis (N30.20) 2. Gross hematuria (R31.0)  History of Present Illness     73 YO female returns today for a cystoscopy for hx of gross hematuria and recurrent UTI. She was last seen by Jimmey Ralph, NP on 11/25/13. She was s/p D&C by GYN for uterine polyp 2 weeks ago. Since procedure has had bladder/pelvic pain and fullness. Developed gross hematuria post procedure and GYN has now treated with 2 rounds of ABX (Nitrofurantoin and Cephalexin) since procedure with no improvement of sxs. Has urine culture 2 weeks ago that was negative for UTI. GYN repeated culture yesterday still pending. Denies f/c or n/v.     Aug 2015 flexible cystoscopy with abnormal area of edemal in her posterior bladder dome, possibley 2ndary to inflammation of the colonic diverticulitus on the other side of the bladder.      Originally referred by Dr. Addison Lank June 2015 for further evaluation of recurrent UTI's. Hx of fever 102, suprapubic pain bilaterally, successfully treated with amoxicillin (May, 2015). she has a hx of Celiac disease, and recently had urine c/s reported as 25k colonies of enterococcus, sensitive to Macrodantin.      She c/o pelvic tenderness. No fever or chills. Not sexually active. Last CT June 2015.   Past Medical History Problems  1. History of Celiac disease (K90.0) 2. History of arthritis (Z87.39) 3. History of cardiac murmur (Z86.79) 4. History of diverticulitis of colon (Z87.19) 5. History of esophageal reflux (Z87.19) 6. History of hypercholesterolemia (Z86.39) 7. History of hypertension (Z86.79) 8. History of rheumatic fever (Z86.79) 9. History of Uterine polyp (N84.0)  Surgical History Problems  1. History of Appendectomy 2. History of Arthroscopy Knee Right 3. History of Dilation And Curettage 4. History of Neuroplasty Median Nerve At Carpal Tunnel 5. History of Ovarian Surgery 6. History of  Tonsillectomy  Current Meds 1. AmLODIPine Besylate 5 MG Oral Tablet;  Therapy: (Recorded:08Jun2015) to Recorded 2. Aspirin 81 MG Oral Tablet;  Therapy: (Recorded:11Nov2015) to Recorded 3. Losartan Potassium 100 MG Oral Tablet;  Therapy: (Recorded:08Jun2015) to Recorded 4. Metoprolol Succinate ER 50 MG Oral Tablet Extended Release 24 Hour;  Therapy: (Recorded:08Jun2015) to Recorded 5. Pantoprazole Sodium 40 MG Oral Tablet Delayed Release;  Therapy: (Recorded:08Jun2015) to Recorded 6. Phenazopyridine HCl - 200 MG Oral Tablet; TAKE 1 TABLET 3 TIMES  DAILY AS NEEDED FOR PAIN;  Therapy: [id number] to (Evaluate:01Dec2015)  Requested for: 10GYI9485;  Last Rx:11Nov2015 Ordered 7. Probiotic Oral Capsule;  Therapy: (Recorded:08Jun2015) to Recorded 8. Restasis 0.05 % Ophthalmic Emulsion;  Therapy: (Recorded:08Jun2015) to Recorded 9. SMZ-TMP DS 800-160 MG Oral Tablet; TAKE 1 TABLET BID;  Therapy: [id number] to (Evaluate:18Nov2015)  Requested for: 93GHW2993;  Last Rx:11Nov2015 Ordered 10. Vitamin B12 TABS;   Therapy: (Recorded:08Jun2015) to Recorded 11. Vitamin D3 2000 UNIT Oral Tablet;   Therapy: (Recorded:08Jun2015) to Recorded  Allergies Medication  1. ACE Inhibitors 2. Ciprofloxacin HCl TABS 3. Ibuprofen TABS 4. Levaquin TABS 5. Lisinopril TABS 6. Motrin TABS 7. Simvastatin TABS  Family History Problems  1. Family history of Colon disorder : Sister, Brother 2. Family history of Deceased : Mother, Father 3. Family history of cerebrovascular accident (Z82.3) : Mother 4. Family history of hypertension (Z82.49) : Mother 5. Family history of Gastric disorder : Mother, Father  Social History Problems  1. Caffeine use (F15.90)   2 per day 2. Former smoker [phone])   1 ppd or less x 89yr, quit 421yrago 3. Married 4. No alcohol use  5. Number of children   1 son, 1 daughter 71. Retired  Review of Systems Genitourinary, constitutional, skin, eye, otolaryngeal,  hematologic/lymphatic, cardiovascular, pulmonary, endocrine, musculoskeletal, gastrointestinal, neurological and psychiatric system(s) were reviewed and pertinent findings if present are noted and are otherwise negative.  Genitourinary: hematuria, cloudy urine and pelvic pain.    Vitals Vital Signs [Data Includes: Last 1 Day]  Recorded: [id number] 09:57AM  Blood Pressure: 149 / 71 Temperature: 98 F Heart Rate: 72  Physical Exam Constitutional: Well nourished and well developed . No acute distress.  ENT:. The ears and nose are normal in appearance.  Neck: The appearance of the neck is normal and no neck mass is present.  Pulmonary: No respiratory distress and normal respiratory rhythm and effort.  Cardiovascular: Heart rate and rhythm are normal . No peripheral edema.  Abdomen: The abdomen is flat. The abdomen is soft and nontender. No masses are palpated. No CVA tenderness. No hernias are palpable. No hepatosplenomegaly noted.  Genitourinary:  Chaperone Present: .  Examination of the external genitalia shows normal female external genitalia and no lesions. The urethra is normal in appearance and not tender. There is no urethral mass. Vaginal exam demonstrates no abnormalities. No cystocele is identified. No enterocele is identified. No rectocele is identified. The cervix is is absent. The uterus is absent. The adnexa are palpably normal. The bladder is non tender and not distended. The anus is normal on inspection. The perineum is normal on inspection.  Lymphatics: The femoral and inguinal nodes are not enlarged or tender.  Skin: Normal skin turgor, no visible rash and no visible skin lesions.  Neuro/Psych:. Mood and affect are appropriate.    Results/Data Urine [Data Includes: Last 1 Day]   [id number]  COLOR YELLOW   APPEARANCE CLOUDY   SPECIFIC GRAVITY <1.005   pH 6.0   GLUCOSE NEG mg/dL  BILIRUBIN NEG   KETONE NEG mg/dL  BLOOD LARGE   PROTEIN NEG mg/dL  UROBILINOGEN 0.2 mg/dL   NITRITE NEG   LEUKOCYTE ESTERASE LARGE   SQUAMOUS EPITHELIAL/HPF FEW   WBC 21-50 WBC/hpf  RBC NONE SEEN RBC/hpf  BACTERIA FEW   CRYSTALS NONE SEEN   CASTS NONE SEEN   Selected Results  CT-ABD/PELVIS W/W/O CONTRAST [id number] 12:00AM Carolan Clines   Test Name Result Flag Reference  CT-ABD/PELVIS W/W/O CONTRAST (Report)    ** RADIOLOGY REPORT BY  RADIOLOGY, [facility] **   CLINICAL DATA: Recurrent urinary tract infections. Diverticulitis. Celiac disease.  EXAM: CT ABDOMEN AND PELVIS WITHOUT AND WITH CONTRAST  TECHNIQUE: Multidetector CT imaging of the abdomen and pelvis was performed following the standard protocol before and following the bolus administration of intravenous contrast.  CONTRAST: 100 mL Isovue 300  COMPARISON: 01/31/2011  FINDINGS: Lung bases: Tiny sub-cm pulmonary nodule in lateral left lung base remains stable, consistent with benign etiology.  Kidneys / Ureters / Bladder: No evidence of renal calculi or hydronephrosis. No evidence of ureteral calculi or dilatation. No bladder calculi identified.  A tiny sub-cm cyst is seen in the posterior midpole of the right kidney. No complex cystic or solid renal masses are identified. No masses are filling defects are seen involving the renal collecting systems, opacified portions of ureters, or urinary bladder.  Other: The liver, gallbladder, spleen, pancreas, and adrenal glands are normal in appearance. No soft tissue masses or lymphadenopathy identified within the abdomen or pelvis. Uterus and adnexal regions are unremarkable in appearance.  Diverticulosis is again seen involving the sigmoid colon, however there is no evidence of  diverticulitis. No gastric definite fistula seen involving the urinary bladder. No other inflammatory process or abnormal fluid collections identified.  Bones / Musculoskeletal: No suspicious bone lesions identified.  IMPRESSION: Sigmoid diverticulosis. No radiographic  evidence of diverticulitis. No inflammatory process or signs of fistula involving the bladder.  No radiographic evidence of urinary tract neoplasm or other significant abnormality.   Electronically Signed  By: Earle Gell M.D.  On: 06/24/2013 12:44   BUN & CREATININE [id number] 03:40PM Carolan Clines  SPECIMEN TYPE: BLOOD   Test Name Result Flag Reference  CREATININE 0.73 mg/dL  0.50-1.40  BUN 10 mg/dL  6-23  Est GFR, African American >89 mL/min    Est GFR, NonAfrican American 81 mL/min    THE ESTIMATED GFR IS A CALCULATION VALID FOR ADULTS (>=9 YEARS OLD) THAT USES THE CKD-EPI ALGORITHM TO ADJUST FOR AGE AND SEX. IT IS   NOT TO BE USED FOR CHILDREN, PREGNANT WOMEN, HOSPITALIZED PATIENTS,    PATIENTS ON DIALYSIS, OR WITH RAPIDLY CHANGING KIDNEY FUNCTION. ACCORDING TO THE NKDEP, EGFR >89 IS NORMAL, 60-89 SHOWS MILD IMPAIRMENT, 30-59 SHOWS MODERATE IMPAIRMENT, 15-29 SHOWS SEVERE IMPAIRMENT AND <15 IS ESRD.   Procedure  Procedure: Cystoscopy  Chaperone Present: kim lewis.  Indication: Hematuria. Frequent UTI.  Informed Consent: Risks, benefits, and potential adverse events were discussed and informed consent was obtained from the patient.  Prep: The patient was prepped with betadine.  Anesthesia:. Local anesthesia was administered intraurethrally with 2% lidocaine jelly.  Antibiotic prophylaxis: Ciprofloxacin.  Procedure Note:  Urethral meatus:. No abnormalities.  Anterior urethra: No abnormalities.  Prostatic urethra: No abnormalities.  Bladder: Visulization was clear. The right ureteral orifice was in the normal anatomic position. The left ureteral orifice was not able to be identified. Examination of the bladder demonstrated no trabeculation and no diverticulum erythematous mucosa located near the dome of the bladder measuring approximately 6 cm  and edema located on the left side, near the trigone of the bladder . 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  Complications: None.    Assessment Assessed  1. Gross hematuria (R31.0) 2. Chronic cystitis (N30.20) 3. 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 I cannot see the L ureteral orifice. She needs CT abdomen and pelvis, IV contrast; and cath urine for cytologies.   Plan Chronic cystitis, Gross hematuria  1. Follow-up MD/NP/PA Office  Follow-up has 6 month cysto in Feb w/Dr.  Clemetine Marker this needs to be much sooner at next available opening  Status:  Canceled - Appointment,Date of Service Gross hematuria  2. Follow-up [place] Procedure Office  Follow-up same day as CT for cath ua for  cytology reflex to FISH  Status: Hold For - Appointment,Date of Service   Requested for: [id number] 3. CREATININE with eGFR; Status:In Progress - Specimen/Data Collected;    Done: [id number] 4. VENIPUNCTURE; Status:Complete;   Done: [id number] Health Maintenance  5. UA With REFLEX; [Do Not Release]; Status:Resulted - Requires  Verification;   Done: [id number] 09:37AM  CT IV contrast Abd and pelvis; cath urine for cytologies  Surgery for cystosopy, bladder dome cold cup biopsy, TUR trigone biopsy, and L retrograde pyelogram. ? need for stent ( will need to review CT first).   Discussion/Summary cc: Cari Caraway, MD     Signatures Electronically signed by : Carolan Clines, M.D.; Dec 04 2013 12:08PM EST

## 2013-12-23 NOTE — Transfer of Care (Signed)
Immediate Anesthesia Transfer of Care Note  Patient: [name]  Procedure(s) Performed: Procedure(s): CYSTOSCOPY WITH LEFT RETROGRADE PYELOGRAM (Left) TRANSURETHRAL RESECTION OF BLADDER TUMOR (TURBT) (N/A)  Patient Location: PACU  Anesthesia Type:General  Level of Consciousness: awake, alert  and oriented  Airway & Oxygen Therapy: Patient Spontanous Breathing and Patient connected to face mask oxygen  Post-op Assessment: Report given to PACU RN and Post -op Vital signs reviewed and stable  Post vital signs: Reviewed and stable  Complications: No apparent anesthesia complications

## 2013-12-23 NOTE — Op Note (Signed)
Pre-operative diagnosis :   Left posterior bladder wall bladder tumor, cm  Postoperative diagnosis:  Same  Operation:  Cystoscopy, Left retrograde pyelogram with interpretation, TUR-BT  Surgeon:  S. Gaynelle Arabian, MD  First assistant:  None  Anesthesia:  General LMA  Preparation: After appropriate pre-medication, the patient was brought to the operating room and placed upon the operating table in the dorsal supine position, where general, LMA anesthesia was introduced. She was replaced in the dorsal lithotomy position, and the armband ws double-checked and the history was reviewed.   Review history:    73 YO female returns today for a cystoscopy for hx of gross hematuria and recurrent UTI. She was last seen by Jimmey Ralph, NP on 11/25/13. She was s/p D&C by GYN for uterine polyp 2 weeks ago. Since procedure has had bladder/pelvic pain and fullness. Developed gross hematuria post procedure and GYN has now treated with 2 rounds of ABX (Nitrofurantoin and Cephalexin) since procedure with no improvement of sxs. Has urine culture 2 weeks ago that was negative for UTI. GYN repeated culture yesterday still pending. Denies f/c or n/v.     Aug 2015 flexible cystoscopy with abnormal area of edema in her posterior bladder dome, possibly 2ndary to inflammation of the colonic diverticulitis on the other side of the bladder.     Originally referred by Dr. Addison Lank June 2015 for further evaluation of recurrent UTI's. Hx of fever 102, suprapubic pain bilaterally, successfully treated with amoxicillin (May, 2015). she has a hx of Celiac disease, and recently had urine c/s reported as 25k colonies of enterococcus, sensitive to Macrodantin.     She c/o pelvic tenderness. No fever or chills. Not sexually active. Last CT June 2015.   Statement of  Likelihood of Success: Excellent. TIME-OUT observed.:  Procedure:  Cystoscopy showed normal trigone, with patent ureteral orifices located in normal position on  the trigone. Cystoscopy further showed a 7 cm edematous mass in the Left posterior wall of the bladder, with a necrotic center. Left retrograde pyelography was obtained, with the finding of a tight lower ureter-which would not admit a 35F open-ended catheter. Retrograde pyelogram using x3 magnification showed no stricture or tumor or stone. The ureteral wall was smooth, but tight.    Attention was directed to the Left posterior wall tumor, which was resected, yielding a necrotic center. This was noted to bleed, and was electrocoagulated. No frank tumor was seen, and tissue was sent to the laboratory for examination, following tissue time out. Photo-documentation was accomplished. No bleeding was noted. A 55F hematuria, Coude catheter was inserted, with 30cc in the balloon, and the catheter irrigated clear. No traction or CBI was needed.    The patient was awakened and taken to the [facility] in good condition.

## 2013-12-23 NOTE — Progress Notes (Signed)
Urology Progress Note  Day of Surgery   Subjective: post op TUR-BT. Appearance of inflammation, not of TCC.      No acute urologic events overnight. Ambulation:  Neg. Flatus:    Neg. Bowel movement  Neg.  Pain: Minimal. Urine clear. C/o nausea.  Objective:  Blood pressure 145/65, pulse 65, temperature 97.6 F (36.4 C), temperature source Oral, resp. rate 16, height 5' (1.524 m), weight 58.968 kg (130 lb), SpO2 100 %.  Physical Exam:  General:  No acute distress, awake  Genitourinary:  Normal BUS Foley: yes,     I/O last 3 completed shifts: In: 1217.5 [I.V.:1217.5] Out: 100 [Urine:100]  No results for input(s): HGB, WBC, PLT in the last 72 hours.  No results for input(s): NA, K, CL, CO2, BUN, CREATININE, CALCIUM, GFRNONAA, GFRAA in the last 72 hours.  Invalid input(s): MAGNESIUM   No results for input(s): INR, APTT in the last 72 hours.  Invalid input(s): PT   Invalid input(s): ABG  Assessment/Plan:  Foley out in AM.

## 2013-12-23 NOTE — Anesthesia Preprocedure Evaluation (Signed)
Anesthesia Evaluation  Patient identified by MRN, date of birth, ID band Patient awake    Reviewed: Allergy & Precautions, H&P , Patient's Chart, lab work & pertinent test results, reviewed documented beta blocker date and time   History of Anesthesia Complications (+) history of anesthetic complications  Airway Mallampati: II  TM Distance: >3 FB Neck ROM: full    Dental no notable dental hx.    Pulmonary former smoker,  breath sounds clear to auscultation  Pulmonary exam normal       Cardiovascular hypertension, Pt. on home beta blockers and Pt. on medications + Valvular Problems/Murmurs Rhythm:regular Rate:Normal     Neuro/Psych  Neuromuscular disease    GI/Hepatic Neg liver ROS, hiatal hernia, PUD, GERD-  Medicated,  Endo/Other  negative endocrine ROS  Renal/GU negative Renal ROS     Musculoskeletal  (+) Arthritis -,   Abdominal   Peds  Hematology negative hematology ROS (+)   Anesthesia Other Findings   Reproductive/Obstetrics                             Anesthesia Physical  Anesthesia Plan  ASA: II  Anesthesia Plan: General   Post-op Pain Management:    Induction: Intravenous  Airway Management Planned: LMA  Additional Equipment:   Intra-op Plan:   Post-operative Plan: Extubation in OR  Informed Consent: I have reviewed the patients History and Physical, chart, labs and discussed the procedure including the risks, benefits and alternatives for the proposed anesthesia with the patient or authorized representative who has indicated his/her understanding and acceptance.   Dental advisory given  Plan Discussed with: CRNA  Anesthesia Plan Comments:         Anesthesia Quick Evaluation

## 2013-12-23 NOTE — Anesthesia Postprocedure Evaluation (Signed)
Anesthesia Post Note  Patient: [name]  Procedure(s) Performed: Procedure(s) (LRB): CYSTOSCOPY WITH LEFT RETROGRADE PYELOGRAM (Left) TRANSURETHRAL RESECTION OF BLADDER TUMOR (TURBT) (N/A)  Anesthesia type: General  Patient location: PACU  Post pain: Pain level controlled  Post assessment: Post-op Vital signs reviewed  Last Vitals: BP 145/57 mmHg  Pulse 82  Temp(Src) 36.4 C (Oral)  Resp 16  Ht 5' (1.524 m)  Wt 130 lb (58.968 kg)  BMI 25.39 kg/m2  SpO2 99%  Post vital signs: Reviewed  Level of consciousness: sedated  Complications: No apparent anesthesia complications

## 2013-12-24 ENCOUNTER — Encounter (HOSPITAL_COMMUNITY): Payer: Self-pay | Admitting: Urology

## 2013-12-24 DIAGNOSIS — N329 Bladder disorder, unspecified: Secondary | ICD-10-CM | POA: Diagnosis not present

## 2013-12-24 LAB — BASIC METABOLIC PANEL
Anion gap: 12 (ref 5–15)
BUN: 15 mg/dL (ref 6–23)
CALCIUM: 9.1 mg/dL (ref 8.4–10.5)
CHLORIDE: 103 meq/L (ref 96–112)
CO2: 24 mEq/L (ref 19–32)
CREATININE: 0.74 mg/dL (ref 0.50–1.10)
GFR calc non Af Amer: 81 mL/min — ABNORMAL LOW (ref >90)
Glucose, Bld: 164 mg/dL — ABNORMAL HIGH (ref 70–99)
Potassium: 4.2 mEq/L (ref 3.7–5.3)
Sodium: 139 mEq/L (ref 137–147)

## 2013-12-24 MED ORDER — PHENAZOPYRIDINE HCL 200 MG PO TABS: 200.0000 mg | ORAL_TABLET | Freq: Three times a day (TID) | ORAL | Status: DC | PRN

## 2013-12-24 MED ORDER — TRAMADOL-ACETAMINOPHEN 37.5-325 MG PO TABS
1.0000 | ORAL_TABLET | Freq: Four times a day (QID) | ORAL | Status: DC | PRN
Start: 2013-12-24 — End: 2014-02-03

## 2013-12-24 MED ORDER — TRIMETHOPRIM 100 MG PO TABS: 100.0000 mg | ORAL_TABLET | ORAL | Status: DC

## 2013-12-24 NOTE — Progress Notes (Signed)
Foley out. Pt tolerated very well. Pt walked in hallway this morning.

## 2013-12-24 NOTE — Discharge Instructions (Signed)
Cystoscopy Cystoscopy is a procedure that is used to help your caregiver diagnose and sometimes treat conditions that affect your lower urinary tract. Your lower urinary tract includes your bladder and the tube through which urine passes from your bladder out of your body (urethra). Cystoscopy is performed with a thin, tube-shaped instrument (cystoscope). The cystoscope has lenses and a light at the end so that your caregiver can see inside your bladder. The cystoscope is inserted at the entrance of your urethra. Your caregiver guides it through your urethra and into your bladder. There are two main types of cystoscopy:  Flexible cystoscopy (with a flexible cystoscope).  Rigid cystoscopy (with a rigid cystoscope). Cystoscopy may be recommended for many conditions, including:  Urinary tract infections.  Blood in your urine (hematuria).  Loss of bladder control (urinary incontinence) or overactive bladder.  Unusual cells found in a urine sample.  Urinary blockage.  Painful urination. Cystoscopy may also be done to remove a sample of your tissue to be checked under a microscope (biopsy). It may also be done to remove or destroy bladder stones. LET YOUR CAREGIVER KNOW ABOUT:  Allergies to food or medicine.  Medicines taken, including vitamins, herbs, eyedrops, over-the-counter medicines, and creams.  Use of steroids (by mouth or creams).  Previous problems with anesthetics or numbing medicines.  History of bleeding problems or blood clots.  Previous surgery.  Other health problems, including diabetes and kidney problems.  Possibility of pregnancy, if this applies. PROCEDURE The area around the opening to your urethra will be cleaned. A medicine to numb your urethra (local anesthetic) is used. If a tissue sample or stone is removed during the procedure, you may be given a medicine to make you sleep (general anesthetic). Your caregiver will gently insert the tip of the cystoscope  into your urethra. The cystoscope will be slowly glided through your urethra and into your bladder. Sterile fluid will flow through the cystoscope and into your bladder. The fluid will expand and stretch your bladder. This gives your caregiver a better view of your bladder walls. The procedure lasts about 15-20 minutes. AFTER THE PROCEDURE If a local anesthetic is used, you will be allowed to go home as soon as you are ready. If a general anesthetic is used, you will be taken to a recovery area until you are stable. You may have temporary bleeding and burning on urination. Document Released: 12/25/1999 Document Revised: 09/21/2011 Document Reviewed: 06/20/2011 [facility] Patient Information 2015 [place], [place]. This information is not intended to replace advice given to you by your health care provider. Make sure you discuss any questions you have with your health care provider.  Dysuria Dysuria is the medical term for pain with urination. There are many causes for dysuria, but urinary tract infection is the most common. If a urinalysis was performed it can show that there is a urinary tract infection. A urine culture confirms that you or your child is sick. You will need to follow up with a healthcare provider because:  If a urine culture was done you will need to know the culture results and treatment recommendations.  If the urine culture was positive, you or your child will need to be put on antibiotics or know if the antibiotics prescribed are the right antibiotics for your urinary tract infection.  If the urine culture is negative (no urinary tract infection), then other causes may need to be explored or antibiotics need to be stopped. Today laboratory work may have been done and there  does not seem to be an infection. If cultures were done they will take at least 24 to 48 hours to be completed. Today x-rays may have been taken and they read as normal. No cause can be found for the problems. The  x-rays may be re-read by a radiologist and you will be contacted if additional findings are made. You or your child may have been put on medications to help with this problem until you can see your primary caregiver. If the problems get better, see your primary caregiver if the problems return. If you were given antibiotics (medications which kill germs), take all of the mediations as directed for the full course of treatment.  If laboratory work was done, you need to find the results. Leave a telephone number where you can be reached. If this is not possible, make sure you find out how you are to get test results. HOME CARE INSTRUCTIONS   Drink lots of fluids. For adults, drink eight, 8 ounce glasses of clear juice or water a day. For children, replace fluids as suggested by your caregiver.  Empty the bladder often. Avoid holding urine for long periods of time.  After a bowel movement, women should cleanse front to back, using each tissue only once.  Empty your bladder before and after sexual intercourse.  Take all the medicine given to you until it is gone. You may feel better in a few days, but TAKE ALL MEDICINE.  Avoid caffeine, tea, alcohol and carbonated beverages, because they tend to irritate the bladder.  In men, alcohol may irritate the prostate.  Only take over-the-counter or prescription medicines for pain, discomfort, or fever as directed by your caregiver.  If your caregiver has given you a follow-up appointment, it is very important to keep that appointment. Not keeping the appointment could result in a chronic or permanent injury, pain, and disability. If there is any problem keeping the appointment, you must call back to this facility for assistance. SEEK IMMEDIATE MEDICAL CARE IF:   Back pain develops.  A fever develops.  There is nausea (feeling sick to your stomach) or vomiting (throwing up).  Problems are no better with medications or are getting worse. MAKE SURE  YOU:   Understand these instructions.  Will watch your condition.  Will get help right away if you are not doing well or get worse. Document Released: 09/25/2003 Document Revised: 03/21/2011 Document Reviewed: 08/02/2007 [facility] Patient Information 2015 [place], [place]. This information is not intended to replace advice given to you by your health care provider. Make sure you discuss any questions you have with your health care provider.

## 2013-12-24 NOTE — Discharge Summary (Signed)
Physician Discharge Summary  Patient ID: [name] MRN: [id number] DOB/AGE: [id number] 75 y.o.  Admit date: 12/23/2013 Discharge date: 12/24/2013 Cc: Dr. 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   Discharged Condition: good. Voiding well.   Hospital Course:  Cystoscopy, TUR bladder mass Significant Diagnostic Studies: No results found.  Discharge Exam: Blood pressure 135/55, pulse 79, temperature 97.6 F (36.4 C), temperature source Oral, resp. rate 16, height 5' (1.524 m), weight 58.968 kg (130 lb), SpO2 100 %.   Disposition: 01-Home or Self Care  Discharge Instructions    Discontinue IV    Complete by:  As directed             Medication List    TAKE these medications        acidophilus Caps capsule  Take 1 capsule by mouth every evening.     amLODipine 5 MG tablet  Commonly known as:  NORVASC  Take 5 mg by mouth 2 (two) times daily.     desonide 0.05 % cream  Commonly known as:  DESOWEN  Apply 1 application topically 2 (two) times daily as needed.     losartan 100 MG tablet  Commonly known as:  COZAAR  Take 100 mg by mouth daily.     losartan 100 MG tablet  Commonly known as:  COZAAR  Take 1 tablet (100 mg total) by mouth daily.     metoprolol succinate 50 MG 24 hr tablet  Commonly known as:  TOPROL-XL  Take 50 mg by mouth every morning.     pantoprazole 40 MG tablet  Commonly known as:  PROTONIX  Take 40 mg by mouth 2 (two) times daily.     phenazopyridine 200 MG tablet  Commonly known as:  PYRIDIUM  Take 1 tablet (200 mg total) by mouth 3 (three) times daily as needed for pain.     polyethylene glycol packet  Commonly known as:  MIRALAX / GLYCOLAX  Take 17 g by mouth daily as needed for moderate constipation.     RESTASIS 0.05 % ophthalmic emulsion  Generic drug:  cycloSPORINE  Place 1 drop into both eyes 2  (two) times daily.     traMADol-acetaminophen 37.5-325 MG per tablet  Commonly known as:  ULTRACET  Take 1 tablet by mouth every 6 (six) hours as needed.     trimethoprim 100 MG tablet  Commonly known as:  TRIMPEX  Take 1 tablet (100 mg total) by mouth 1 day or 1 dose.     vitamin B-12 1000 MCG tablet  Commonly known as:  CYANOCOBALAMIN  Take 1,000 mcg by mouth daily at 12 noon.     Vitamin D3 2000 UNITS Tabs  Take 1 tablet by mouth daily at 12 noon.           Follow-up Information    Follow up with Ailene Rud, MD.   Specialty:  Urology   Contact information:   [place] [address] [phone]     Discussed pathology with patient, and need for abdominal exploration, combined with General Surgery, as elective procedure. She will RTC for surgical planning.   SignedCarolan Clines I 12/24/2013, 9:29 AM

## 2013-12-24 NOTE — Progress Notes (Signed)
Urology Progress Note  1 Day Post-Op   Subjective: post TUR-bladder tumor> Pathology : I(nflammatory with necrosis. Most consistant with chronic fistula formation. No cancer seen. Note pt's recent hx of hospitalization for diverticulitis in August.      No acute urologic events overnight. Ambulation:   negative Flatus:    positive Bowel movement  negative  Pain: complete resolution  Objective:  Blood pressure 135/55, pulse 79, temperature 97.6 F (36.4 C), temperature source Oral, resp. rate 16, height 5' (1.524 m), weight 58.968 kg (130 lb), SpO2 100 %.  Physical Exam:  General:  No acute distress, awake Extremities: extremities normal, atraumatic, no cyanosis or edema Genitourinary:    Normal BUS Foley: out    I/O last 3 completed shifts: In: 1217.5 [I.V.:1217.5] Out: 034 [Urine:875]  No results for input(s): HGB, WBC, PLT in the last 72 hours.  Recent Labs     12/24/13  0408  NA  139  K  4.2  CL  103  CO2  24  BUN  15  CREATININE  0.74  CALCIUM  9.1  GFRNONAA  81*  GFRAA  >90     No results for input(s): INR, APTT in the last 72 hours.  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 She will need abdominal exploration and partial cystectomy, and probably bowel resection. Will arrange after holidays.   Catheter removed.Home on antibiotic suppression therapy.   RTC for further surgery scheduling

## 2014-01-28 ENCOUNTER — Other Ambulatory Visit: Payer: Self-pay | Admitting: Urology

## 2014-01-29 NOTE — Progress Notes (Signed)
Please put orders in Epic surgery 02-04-14 pre op 01-30-14 Thanks

## 2014-01-29 NOTE — Progress Notes (Addendum)
Called Dr. Arlyn Leak office requested release of  orders to Epic sign and held surgery 02-04-14 pre op 01-30-14 Thanks

## 2014-01-30 ENCOUNTER — Encounter (HOSPITAL_COMMUNITY)
Admission: RE | Admit: 2014-01-30 | Discharge: 2014-01-30 | Disposition: A | Discharge status: Home / Self Care | Payer: Medicare Other | Source: Ambulatory Visit | Attending: Surgery | Admitting: Surgery

## 2014-01-30 ENCOUNTER — Encounter (HOSPITAL_COMMUNITY): Payer: Self-pay

## 2014-01-30 DIAGNOSIS — Z01818 Encounter for other preprocedural examination: Secondary | ICD-10-CM | POA: Diagnosis not present

## 2014-01-30 DIAGNOSIS — N321 Vesicointestinal fistula: Secondary | ICD-10-CM | POA: Diagnosis not present

## 2014-01-30 HISTORY — DX: Nausea with vomiting, unspecified: Z98.890

## 2014-01-30 HISTORY — DX: Vesicointestinal fistula: N32.1

## 2014-01-30 HISTORY — DX: Nausea with vomiting, unspecified: R11.2

## 2014-01-30 HISTORY — DX: Rheumatic fever without heart involvement: I00

## 2014-01-30 HISTORY — DX: Anemia, unspecified: D64.9

## 2014-01-30 LAB — BASIC METABOLIC PANEL
Anion gap: 5 (ref 5–15)
BUN: 19 mg/dL (ref 6–23)
CHLORIDE: 104 meq/L (ref 96–112)
CO2: 27 mmol/L (ref 19–32)
Calcium: 9.1 mg/dL (ref 8.4–10.5)
Creatinine, Ser: 0.94 mg/dL (ref 0.50–1.10)
GFR calc non Af Amer: 58 mL/min — ABNORMAL LOW (ref >90)
GFR, EST AFRICAN AMERICAN: 67 mL/min — AB (ref >90)
GLUCOSE: 87 mg/dL (ref 70–99)
Potassium: 4.8 mmol/L (ref 3.5–5.1)
SODIUM: 136 mmol/L (ref 135–145)

## 2014-01-30 LAB — CBC
HCT: 37 % (ref 36.0–46.0)
Hemoglobin: 11.8 g/dL — ABNORMAL LOW (ref 12.0–15.0)
MCH: 29.6 pg (ref 26.0–34.0)
MCHC: 31.9 g/dL (ref 30.0–36.0)
MCV: 92.7 fL (ref 78.0–100.0)
Platelets: 293 10*3/uL (ref 150–400)
RBC: 3.99 MIL/uL (ref 3.87–5.11)
RDW: 14.3 % (ref 11.5–15.5)
WBC: 6.5 10*3/uL (ref 4.0–10.5)

## 2014-01-30 LAB — ABO/RH: ABO/RH(D): O POS

## 2014-01-30 NOTE — Progress Notes (Signed)
No antibiotic ordered for day of surgery 02/04/14. Please order antibiotic per SCIP protocol.  Thank you

## 2014-01-30 NOTE — Patient Instructions (Signed)
[name]  01/30/2014   Your procedure is scheduled on: Tuesday 02/04/14  Report to [facility] Main  Entrance and follow signs to               [place] at 05:30 AM.  Call this number if you have problems the morning of surgery [phone]   Remember:  Do not eat food or drink liquids :After Midnight.     Take these medicines the morning of surgery with A SIP OF WATER: amlodipine, eye drops, protonix                               You may not have any metal on your body including hair pins and              piercings  Do not wear jewelry, make-up, lotions, powders or perfumes.             Do not wear nail polish.  Do not shave  48 hours prior to surgery.              Men may shave face and neck.  Do not bring valuables to the hospital. [place].  Contacts, dentures or bridgework may not be worn into surgery.  Leave suitcase in the car. After surgery it may be brought to your room.    _____________________________________________________________________             [facility] - Preparing for Surgery Before surgery, you can play an important role.  Because skin is not sterile, your skin needs to be as free of germs as possible.  You can reduce the number of germs on your skin by washing with CHG (chlorahexidine gluconate) soap before surgery.  CHG is an antiseptic cleaner which kills germs and bonds with the skin to continue killing germs even after washing. Please DO NOT use if you have an allergy to CHG or antibacterial soaps.  If your skin becomes reddened/irritated stop using the CHG and inform your nurse when you arrive at Short Stay. Do not shave (including legs and underarms) for at least 48 hours prior to the first CHG shower.  You may shave your face/neck. Please follow these instructions carefully:  1.  Shower with CHG Soap the night before surgery and the  morning of Surgery.  2.  If you  choose to wash your hair, wash your hair first as usual with your  normal  shampoo.  3.  After you shampoo, rinse your hair and body thoroughly to remove the  shampoo.                           4.  Use CHG as you would any other liquid soap.  You can apply chg directly  to the skin and wash                       Gently with a scrungie or clean washcloth.  5.  Apply the CHG Soap to your body ONLY FROM THE NECK DOWN.   Do not use on face/ open  Wound or open sores. Avoid contact with eyes, ears mouth and genitals (private parts).                       Wash face,  Genitals (private parts) with your normal soap.             6.  Wash thoroughly, paying special attention to the area where your surgery  will be performed.  7.  Thoroughly rinse your body with warm water from the neck down.  8.  DO NOT shower/wash with your normal soap after using and rinsing off  the CHG Soap.                9.  Pat yourself dry with a clean towel.            10.  Wear clean pajamas.            11.  Place clean sheets on your bed the night of your first shower and do not  sleep with pets. Day of Surgery : Do not apply any lotions/deodorants the morning of surgery.  Please wear clean clothes to the hospital/surgery center.  FAILURE TO FOLLOW THESE INSTRUCTIONS MAY RESULT IN THE CANCELLATION OF YOUR SURGERY PATIENT SIGNATURE_________________________________  NURSE SIGNATURE__________________________________  ________________________________________________________________________   Adam Phenix  An incentive spirometer is a tool that can help keep your lungs clear and active. This tool measures how well you are filling your lungs with each breath. Taking long deep breaths may help reverse or decrease the chance of developing breathing (pulmonary) problems (especially infection) following:  A long period of time when you are unable to move or be active. BEFORE THE PROCEDURE   If  the spirometer includes an indicator to show your best effort, your nurse or respiratory therapist will set it to a desired goal.  If possible, sit up straight or lean slightly forward. Try not to slouch.  Hold the incentive spirometer in an upright position. INSTRUCTIONS FOR USE  1. Sit on the edge of your bed if possible, or sit up as far as you can in bed or on a chair. 2. Hold the incentive spirometer in an upright position. 3. Breathe out normally. 4. Place the mouthpiece in your mouth and seal your lips tightly around it. 5. Breathe in slowly and as deeply as possible, raising the piston or the ball toward the top of the column. 6. Hold your breath for 3-5 seconds or for as long as possible. Allow the piston or ball to fall to the bottom of the column. 7. Remove the mouthpiece from your mouth and breathe out normally. 8. Rest for a few seconds and repeat Steps 1 through 7 at least 10 times every 1-2 hours when you are awake. Take your time and take a few normal breaths between deep breaths. 9. The spirometer may include an indicator to show your best effort. Use the indicator as a goal to work toward during each repetition. 10. After each set of 10 deep breaths, practice coughing to be sure your lungs are clear. If you have an incision (the cut made at the time of surgery), support your incision when coughing by placing a pillow or rolled up towels firmly against it. Once you are able to get out of bed, walk around indoors and cough well. You may stop using the incentive spirometer when instructed by your caregiver.  RISKS AND COMPLICATIONS  Take your time so you do not get  dizzy or light-headed.  If you are in pain, you may need to take or ask for pain medication before doing incentive spirometry. It is harder to take a deep breath if you are having pain. AFTER USE  Rest and breathe slowly and easily.  It can be helpful to keep track of a log of your progress. Your caregiver can  provide you with a simple table to help with this. If you are using the spirometer at home, follow these instructions: [employer] IF:   You are having difficultly using the spirometer.  You have trouble using the spirometer as often as instructed.  Your pain medication is not giving enough relief while using the spirometer.  You develop fever of 100.5 F (38.1 C) or higher. SEEK IMMEDIATE MEDICAL CARE IF:   You cough up bloody sputum that had not been present before.  You develop fever of 102 F (38.9 C) or greater.  You develop worsening pain at or near the incision site. MAKE SURE YOU:   Understand these instructions.  Will watch your condition.  Will get help right away if you are not doing well or get worse. Document Released: 05/09/2006 Document Revised: 03/21/2011 Document Reviewed: 07/10/2006 [facility] Patient Information 2014 [place], [place].   ________________________________________________________________________

## 2014-01-30 NOTE — Progress Notes (Signed)
Please put in orders for surgery on 02/04/14.  Thank you

## 2014-02-03 ENCOUNTER — Encounter (HOSPITAL_COMMUNITY): Payer: Self-pay | Admitting: Emergency Medicine

## 2014-02-03 ENCOUNTER — Ambulatory Visit (INDEPENDENT_AMBULATORY_CARE_PROVIDER_SITE_OTHER): Payer: Self-pay | Admitting: Surgery

## 2014-02-03 ENCOUNTER — Emergency Department (HOSPITAL_COMMUNITY)
Admission: EM | Admit: 2014-02-03 | Discharge: 2014-02-04 | Disposition: A | Discharge status: Home / Self Care | Payer: Medicare Other | Source: Home / Self Care | Attending: Emergency Medicine | Admitting: Emergency Medicine

## 2014-02-03 DIAGNOSIS — I10 Essential (primary) hypertension: Secondary | ICD-10-CM | POA: Diagnosis present

## 2014-02-03 DIAGNOSIS — Z8739 Personal history of other diseases of the musculoskeletal system and connective tissue: Secondary | ICD-10-CM

## 2014-02-03 DIAGNOSIS — Z79899 Other long term (current) drug therapy: Secondary | ICD-10-CM | POA: Insufficient documentation

## 2014-02-03 DIAGNOSIS — K567 Ileus, unspecified: Secondary | ICD-10-CM | POA: Diagnosis not present

## 2014-02-03 DIAGNOSIS — M1990 Unspecified osteoarthritis, unspecified site: Secondary | ICD-10-CM | POA: Diagnosis present

## 2014-02-03 DIAGNOSIS — R011 Cardiac murmur, unspecified: Secondary | ICD-10-CM

## 2014-02-03 DIAGNOSIS — K219 Gastro-esophageal reflux disease without esophagitis: Secondary | ICD-10-CM

## 2014-02-03 DIAGNOSIS — R112 Nausea with vomiting, unspecified: Secondary | ICD-10-CM

## 2014-02-03 DIAGNOSIS — K2270 Barrett's esophagus without dysplasia: Secondary | ICD-10-CM | POA: Diagnosis present

## 2014-02-03 DIAGNOSIS — N321 Vesicointestinal fistula: Principal | ICD-10-CM | POA: Diagnosis present

## 2014-02-03 DIAGNOSIS — Z79891 Long term (current) use of opiate analgesic: Secondary | ICD-10-CM

## 2014-02-03 DIAGNOSIS — Z823 Family history of stroke: Secondary | ICD-10-CM

## 2014-02-03 DIAGNOSIS — R197 Diarrhea, unspecified: Secondary | ICD-10-CM

## 2014-02-03 DIAGNOSIS — R1084 Generalized abdominal pain: Secondary | ICD-10-CM

## 2014-02-03 DIAGNOSIS — Z803 Family history of malignant neoplasm of breast: Secondary | ICD-10-CM

## 2014-02-03 DIAGNOSIS — R609 Edema, unspecified: Secondary | ICD-10-CM | POA: Diagnosis present

## 2014-02-03 DIAGNOSIS — Z8249 Family history of ischemic heart disease and other diseases of the circulatory system: Secondary | ICD-10-CM

## 2014-02-03 DIAGNOSIS — Z87891 Personal history of nicotine dependence: Secondary | ICD-10-CM | POA: Insufficient documentation

## 2014-02-03 DIAGNOSIS — K625 Hemorrhage of anus and rectum: Secondary | ICD-10-CM | POA: Diagnosis present

## 2014-02-03 DIAGNOSIS — K900 Celiac disease: Secondary | ICD-10-CM | POA: Diagnosis present

## 2014-02-03 DIAGNOSIS — D649 Anemia, unspecified: Secondary | ICD-10-CM

## 2014-02-03 DIAGNOSIS — K5732 Diverticulitis of large intestine without perforation or abscess without bleeding: Secondary | ICD-10-CM | POA: Diagnosis present

## 2014-02-03 LAB — CBC WITH DIFFERENTIAL/PLATELET
Basophils Absolute: 0 10*3/uL (ref 0.0–0.1)
Basophils Relative: 0 % (ref 0–1)
EOS ABS: 0 10*3/uL (ref 0.0–0.7)
EOS PCT: 0 % (ref 0–5)
HCT: 39 % (ref 36.0–46.0)
Hemoglobin: 12.8 g/dL (ref 12.0–15.0)
LYMPHS ABS: 0.6 10*3/uL — AB (ref 0.7–4.0)
Lymphocytes Relative: 4 % — ABNORMAL LOW (ref 12–46)
MCH: 29.7 pg (ref 26.0–34.0)
MCHC: 32.8 g/dL (ref 30.0–36.0)
MCV: 90.5 fL (ref 78.0–100.0)
MONO ABS: 0.8 10*3/uL (ref 0.1–1.0)
Monocytes Relative: 5 % (ref 3–12)
NEUTROS ABS: 13 10*3/uL — AB (ref 1.7–7.7)
Neutrophils Relative %: 91 % — ABNORMAL HIGH (ref 43–77)
PLATELETS: 275 10*3/uL (ref 150–400)
RBC: 4.31 MIL/uL (ref 3.87–5.11)
RDW: 13.7 % (ref 11.5–15.5)
WBC: 14.3 10*3/uL — ABNORMAL HIGH (ref 4.0–10.5)

## 2014-02-03 LAB — COMPREHENSIVE METABOLIC PANEL
ALK PHOS: 112 U/L (ref 39–117)
ALT: 15 U/L (ref 0–35)
AST: 24 U/L (ref 0–37)
Albumin: 4.5 g/dL (ref 3.5–5.2)
Anion gap: 17 — ABNORMAL HIGH (ref 5–15)
BUN: 22 mg/dL (ref 6–23)
CO2: 21 mmol/L (ref 19–32)
CREATININE: 0.93 mg/dL (ref 0.50–1.10)
Calcium: 10 mg/dL (ref 8.4–10.5)
Chloride: 104 mmol/L (ref 96–112)
GFR calc non Af Amer: 59 mL/min — ABNORMAL LOW (ref >90)
GFR, EST AFRICAN AMERICAN: 68 mL/min — AB (ref >90)
Glucose, Bld: 135 mg/dL — ABNORMAL HIGH (ref 70–99)
POTASSIUM: 4.2 mmol/L (ref 3.5–5.1)
Sodium: 142 mmol/L (ref 135–145)
TOTAL PROTEIN: 7.5 g/dL (ref 6.0–8.3)
Total Bilirubin: 0.6 mg/dL (ref 0.3–1.2)

## 2014-02-03 LAB — LIPASE, BLOOD: Lipase: 20 U/L (ref 11–59)

## 2014-02-03 MED ORDER — SODIUM CHLORIDE 0.9 % IV BOLUS (SEPSIS)
500.0000 mL | Freq: Once | INTRAVENOUS | Status: AC
  Administered 2014-02-03: 500 mL via INTRAVENOUS

## 2014-02-03 MED ORDER — DEXTROSE 5 % IV SOLN
1.0000 g | Freq: Once | INTRAVENOUS | Status: AC
  Administered 2014-02-04: 1 g via INTRAVENOUS
  Filled 2014-02-03: qty 10

## 2014-02-03 MED ORDER — METRONIDAZOLE IVPB CUSTOM
1.0000 g | Freq: Once | INTRAVENOUS | Status: DC
  Filled 2014-02-03: qty 200

## 2014-02-03 MED ORDER — METRONIDAZOLE 500 MG PO TABS
1000.0000 mg | ORAL_TABLET | Freq: Once | ORAL | Status: AC
  Administered 2014-02-04: 1000 mg via ORAL
  Filled 2014-02-03: qty 2

## 2014-02-03 MED ORDER — ONDANSETRON HCL 4 MG PO TABS: 4.0000 mg | ORAL_TABLET | Freq: Four times a day (QID) | ORAL | Status: DC

## 2014-02-03 NOTE — ED Provider Notes (Signed)
CSN: [id number]     Arrival date & time 02/03/14  6295 History   First MD Initiated Contact with Patient 02/03/14 2027     Chief Complaint  Patient presents with  . Emesis  . Diarrhea     Patient is a 74 y.o. female presenting with vomiting and diarrhea. The history is provided by the patient and a relative. No language interpreter was used.  Emesis Associated symptoms: diarrhea   Diarrhea Associated symptoms: vomiting    [name] presents for evaluation of vomiting and diarrhea. 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 She began the prep with taking Dulcolax and MiraLAX and Gatorade at 7 am this morning.by 11 AM she developed diarrhea, nausea, vomiting. She's had multiple episodes of vomiting and nausea today. She later took the antibiotics that were prescribed for 2pm. She has been unable to tolerate oral fluids since then.she denies any fevers but does have chills. She has abdominal pain prior to vomiting episodes, but then it resolves. Symptoms are moderate, intermittent, worsening.  Past Medical History  Diagnosis Date  . Hypertension   . Celiac disease     per Dr. Fuller Plan  . Diverticulitis     and diverticulosis in sigmoid colon   . GERD (gastroesophageal reflux disease)   . Erosive esophagitis   . Barrett's esophagus   . Hiatal hernia   . Blood in urine     has symptoms of UTI- burning and aching lower abdomen  . Heart murmur     DOES NOT CAUSE ANY PROBLEMS  . Degenerative joint disease     knees  . Colovesical fistula   . Rheumatic fever     as child  . Anemia     as child  . Complication of anesthesia 1966    adverse reaction to saddle block  . PONV (postoperative nausea and vomiting)    Past Surgical History  Procedure Laterality Date  . Appendectomy    . Carpal tunnel release Left   . Knee arthroscopy Right   . Bunionectomy Right   . Hysteroscopy w/d&c  N/A 11/06/2013    Procedure: DILATATION AND CURETTAGE /HYSTEROSCOPY;  Surgeon: Daria Pastures, MD;  Location: [facility];  Service: Gynecology;  Laterality: N/A;  . Cystoscopy w/ retrogrades Left 12/23/2013    Procedure: CYSTOSCOPY WITH LEFT RETROGRADE PYELOGRAM;  Surgeon: Ailene Rud, MD;  Location: [facility];  Service: Urology;  Laterality: Left;  . Transurethral resection of bladder tumor N/A 12/23/2013    Procedure: TRANSURETHRAL RESECTION OF BLADDER TUMOR (TURBT);  Surgeon: Ailene Rud, MD;  Location: [facility];  Service: Urology;  Laterality: N/A;  . Tonsillectomy      x2   Family History  Problem Relation Age of Onset  . Breast cancer Sister   . Heart disease Father   . Stroke Mother     multiple  . Colon cancer Neg Hx   . Esophageal cancer Neg Hx   . Rectal cancer Neg Hx   . Stomach cancer Neg Hx   . Diverticulitis Sister     colon surgery  . Diverticulitis Brother      colon surgery   History  Substance Use Topics  . Smoking status: Former Smoker    Types: Cigarettes    Quit date: 03/18/1967  . Smokeless tobacco: Never Used  . Alcohol Use: No   OB History    No  data available     Review of Systems  Gastrointestinal: Positive for vomiting and diarrhea.  All other systems reviewed and are negative.     Allergies  Gluten meal; Ace inhibitors; Ciprofloxacin; Ibuprofen; Levofloxacin; and Simvastatin  Home Medications   Prior to Admission medications   Medication Sig Start Date End Date Taking? Authorizing Provider  acidophilus (RISAQUAD) CAPS Take 1 capsule by mouth every evening.     Historical Provider, MD  amLODipine (NORVASC) 5 MG tablet Take 5 mg by mouth 2 (two) times daily.     Historical Provider, MD  Cholecalciferol (VITAMIN D3) 2000 UNITS TABS Take 1 tablet by mouth daily at 12 noon.     Historical Provider, MD  cycloSPORINE (RESTASIS) 0.05 % ophthalmic emulsion Place 1 drop into both eyes 2 (two) times daily.    Historical Provider, MD   losartan (COZAAR) 100 MG tablet Take 1 tablet (100 mg total) by mouth daily. Patient not taking: Reported on 01/30/2014 02/04/11 11/04/13  Kara Oats, MD  losartan (COZAAR) 100 MG tablet Take 100 mg by mouth every morning.     Historical Provider, MD  metoprolol succinate (TOPROL-XL) 50 MG 24 hr tablet Take 50 mg by mouth every evening.     Historical Provider, MD  pantoprazole (PROTONIX) 40 MG tablet Take 40 mg by mouth 2 (two) times daily.    Historical Provider, MD  phenazopyridine (PYRIDIUM) 200 MG tablet Take 1 tablet (200 mg total) by mouth 3 (three) times daily as needed for pain. Patient not taking: Reported on 01/30/2014 12/24/13   Ailene Rud, MD  polyethylene glycol [facility] Floria Raveling) packet Take 17 g by mouth daily as needed for moderate constipation.    Historical Provider, MD  traMADol (ULTRAM) 50 MG tablet Take 50 mg by mouth 4 (four) times daily as needed for moderate pain.    Historical Provider, MD  traMADol-acetaminophen (ULTRACET) 37.5-325 MG per tablet Take 1 tablet by mouth every 6 (six) hours as needed. Patient not taking: Reported on 01/30/2014 12/24/13   Ailene Rud, MD  trimethoprim (TRIMPEX) 100 MG tablet Take 1 tablet (100 mg total) by mouth 1 day or 1 dose. Patient taking differently: Take 100 mg by mouth daily.  12/24/13   Ailene Rud, MD  vitamin B-12 (CYANOCOBALAMIN) 1000 MCG tablet Take 1,000 mcg by mouth daily at 12 noon.     Historical Provider, MD   BP 163/70 mmHg  Pulse 90  Temp(Src) 98.1 F (36.7 C) (Oral)  Resp 20  SpO2 100% Physical Exam  Constitutional: She is oriented to person, place, and time. She appears well-developed and well-nourished.  HENT:  Head: Normocephalic and atraumatic.  Cardiovascular: Normal rate and regular rhythm.   No murmur heard. Pulmonary/Chest: Effort normal and breath sounds normal. No respiratory distress.  Abdominal: Soft. There is no rebound and no guarding.  Mild diffuse tenderness  without guarding or rebound  Musculoskeletal: She exhibits no edema or tenderness.  Neurological: She is alert and oriented to person, place, and time.  Skin: Skin is warm and dry.  Psychiatric: She has a normal mood and affect. Her behavior is normal.  Nursing note and vitals reviewed.   ED Course  Procedures (including critical care time) Labs Review Labs Reviewed  CBC WITH DIFFERENTIAL/PLATELET - Abnormal; Notable for the following:    WBC 14.3 (*)    Neutrophils Relative % 91 (*)    Neutro Abs 13.0 (*)    Lymphocytes Relative 4 (*)  Lymphs Abs 0.6 (*)    All other components within normal limits  COMPREHENSIVE METABOLIC PANEL - Abnormal; Notable for the following:    Glucose, Bld 135 (*)    GFR calc non Af Amer 59 (*)    GFR calc Af Amer 68 (*)    Anion gap 17 (*)    All other components within normal limits  LIPASE, BLOOD  URINALYSIS, ROUTINE W REFLEX MICROSCOPIC    Imaging Review No results found.   EKG Interpretation None      MDM   Final diagnoses:  Non-intractable vomiting with nausea, vomiting of unspecified type    Patient here for evaluation of vomiting and lower abdominal pain following a bowel prep for planned surgery in the morning. On recheck in the emergency department patient is feeling improved. Her nausea and vomiting have resolved. She has mild lower abdominal tenderness on recheck but there is no guarding or rebound tenderness. Patient states this is similar to her baseline abdominal pain. UA is pending at time of dictation. Discussed with Dr. Marcello Moores with general surgery via OR scrub nurse - recommends IVF and d/c home with planned surgery in the morning.  Pt provided dose of abx in ED given she missed her evening dose at home.     Quintella Reichert, MD 02/03/14 2337

## 2014-02-03 NOTE — Discharge Instructions (Signed)
Present for surgery in the morning as scheduled. Get rechecked immediately if you develop worsening pain or continued vomiting.    Nausea and Vomiting Nausea is a sick feeling that often comes before throwing up (vomiting). Vomiting is a reflex where stomach contents come out of your mouth. Vomiting can cause severe loss of body fluids (dehydration). Children and elderly adults can become dehydrated quickly, especially if they also have diarrhea. Nausea and vomiting are symptoms of a condition or disease. It is important to find the cause of your symptoms. CAUSES   Direct irritation of the stomach lining. This irritation can result from increased acid production (gastroesophageal reflux disease), infection, food poisoning, taking certain medicines (such as nonsteroidal anti-inflammatory drugs), alcohol use, or tobacco use.  Signals from the brain.These signals could be caused by a headache, heat exposure, an inner ear disturbance, increased pressure in the brain from injury, infection, a tumor, or a concussion, pain, emotional stimulus, or metabolic problems.  An obstruction in the gastrointestinal tract (bowel obstruction).  Illnesses such as diabetes, hepatitis, gallbladder problems, appendicitis, kidney problems, cancer, sepsis, atypical symptoms of a heart attack, or eating disorders.  Medical treatments such as chemotherapy and radiation.  Receiving medicine that makes you sleep (general anesthetic) during surgery. DIAGNOSIS Your caregiver may ask for tests to be done if the problems do not improve after a few days. Tests may also be done if symptoms are severe or if the reason for the nausea and vomiting is not clear. Tests may include:  Urine tests.  Blood tests.  Stool tests.  Cultures (to look for evidence of infection).  X-rays or other imaging studies. Test results can help your caregiver make decisions about treatment or the need for additional tests. TREATMENT You need to  stay well hydrated. Drink frequently but in small amounts.You may wish to drink water, sports drinks, clear broth, or eat frozen ice pops or gelatin dessert to help stay hydrated.When you eat, eating slowly may help prevent nausea.There are also some antinausea medicines that may help prevent nausea. HOME CARE INSTRUCTIONS   Take all medicine as directed by your caregiver.  If you do not have an appetite, do not force yourself to eat. However, you must continue to drink fluids.  If you have an appetite, eat a normal diet unless your caregiver tells you differently.  Eat a variety of complex carbohydrates (rice, wheat, potatoes, bread), lean meats, yogurt, fruits, and vegetables.  Avoid high-fat foods because they are more difficult to digest.  Drink enough water and fluids to keep your urine clear or pale yellow.  If you are dehydrated, ask your caregiver for specific rehydration instructions. Signs of dehydration may include:  Severe thirst.  Dry lips and mouth.  Dizziness.  Dark urine.  Decreasing urine frequency and amount.  Confusion.  Rapid breathing or pulse. SEEK IMMEDIATE MEDICAL CARE IF:   You have blood or brown flecks (like coffee grounds) in your vomit.  You have black or bloody stools.  You have a severe headache or stiff neck.  You are confused.  You have severe abdominal pain.  You have chest pain or trouble breathing.  You do not urinate at least once every 8 hours.  You develop cold or clammy skin.  You continue to vomit for longer than 24 to 48 hours.  You have a fever. MAKE SURE YOU:   Understand these instructions.  Will watch your condition.  Will get help right away if you are not doing well or  get worse. Document Released: 12/27/2004 Document Revised: 03/21/2011 Document Reviewed: 05/26/2010 [facility] Patient Information 2015 [place], [place]. This information is not intended to replace advice given to you by your health care  provider. Make sure you discuss any questions you have with your health care provider.

## 2014-02-03 NOTE — ED Notes (Addendum)
Pt has colon resection and bladder surgery tomorrow, today c/o vomiting and diarrhea. States she was taking the colon prep which caused the diarrhea but not vomiting. Denies pain states she feels bad all over. Pt is taking pre-op antibiotics along wit the colon and has not eating since last night. Pt c/o weakness.

## 2014-02-03 NOTE — H&P (Signed)
Chief Complaint:  Colovesical fistula  History of Present Illness:  [name] is an 74 y.o. 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  Has been having more pain and arrangements made for colectomy and takedown of colovesical fistula with Drs. Chauncy Lean   Past Medical History  Diagnosis Date  . Hypertension   . Celiac disease     per Dr. Fuller Plan  . Diverticulitis     and diverticulosis in sigmoid colon   . GERD (gastroesophageal reflux disease)   . Erosive esophagitis   . Barrett's esophagus   . Hiatal hernia   . Blood in urine     has symptoms of UTI- burning and aching lower abdomen  . Heart murmur     DOES NOT CAUSE ANY PROBLEMS  . Degenerative joint disease     knees  . Colovesical fistula   . Rheumatic fever     as child  . Anemia     as child  . Complication of anesthesia 1966    adverse reaction to saddle block  . PONV (postoperative nausea and vomiting)     Past Surgical History  Procedure Laterality Date  . Appendectomy    . Carpal tunnel release Left   . Knee arthroscopy Right   . Bunionectomy Right   . Hysteroscopy w/d&c N/A 11/06/2013    Procedure: DILATATION AND CURETTAGE /HYSTEROSCOPY;  Surgeon: Daria Pastures, MD;  Location: [facility];  Service: Gynecology;  Laterality: N/A;  . Cystoscopy w/ retrogrades Left 12/23/2013    Procedure: CYSTOSCOPY WITH LEFT RETROGRADE PYELOGRAM;  Surgeon: Ailene Rud, MD;  Location: [facility];  Service: Urology;  Laterality: Left;  . Transurethral resection of bladder tumor N/A 12/23/2013    Procedure: TRANSURETHRAL RESECTION OF BLADDER TUMOR (TURBT);  Surgeon: Ailene Rud, MD;  Location: [facility];  Service: Urology;  Laterality: N/A;  . Tonsillectomy      x2    Current Outpatient Prescriptions  Medication Sig Dispense Refill  . acidophilus (RISAQUAD) CAPS Take 1 capsule by mouth every evening.     Marland Kitchen amLODipine (NORVASC) 5 MG tablet Take 5 mg by mouth 2 (two) times daily.     .  Cholecalciferol (VITAMIN D3) 2000 UNITS TABS Take 1 tablet by mouth daily at 12 noon.     . cycloSPORINE (RESTASIS) 0.05 % ophthalmic emulsion Place 1 drop into both eyes 2 (two) times daily.    Marland Kitchen losartan (COZAAR) 100 MG tablet Take 1 tablet (100 mg total) by mouth daily. (Patient not taking: Reported on 01/30/2014) 30 tablet 0  . losartan (COZAAR) 100 MG tablet Take 100 mg by mouth every morning.     . metoprolol succinate (TOPROL-XL) 50 MG 24 hr tablet Take 50 mg by mouth every evening.     . pantoprazole (PROTONIX) 40 MG tablet Take 40 mg by mouth 2 (two) times daily.    . phenazopyridine (PYRIDIUM) 200 MG tablet Take 1 tablet (200 mg total) by mouth 3 (three) times daily as needed for pain. (Patient not taking: Reported on 01/30/2014) 30 tablet 3  . polyethylene glycol (MIRALAX / GLYCOLAX) packet Take 17 g by mouth daily as needed for moderate constipation.    . traMADol (ULTRAM) 50 MG tablet Take 50 mg by mouth 4 (four) times daily as needed for moderate pain.    . traMADol-acetaminophen (ULTRACET) 37.5-325 MG per tablet Take 1 tablet by mouth every 6 (six) hours as needed. (Patient not taking: Reported on 01/30/2014) 30  tablet 0  . trimethoprim (TRIMPEX) 100 MG tablet Take 1 tablet (100 mg total) by mouth 1 day or 1 dose. (Patient taking differently: Take 100 mg by mouth daily. ) 30 tablet 1  . vitamin B-12 (CYANOCOBALAMIN) 1000 MCG tablet Take 1,000 mcg by mouth daily at 12 noon.      No current facility-administered medications for this visit.   Gluten meal; Ace inhibitors; Ciprofloxacin; Ibuprofen; Levofloxacin; and Simvastatin Family History  Problem Relation Age of Onset  . Breast cancer Sister   . Heart disease Father   . Stroke Mother     multiple  . Colon cancer Neg Hx   . Esophageal cancer Neg Hx   . Rectal cancer Neg Hx   . Stomach cancer Neg Hx   . Diverticulitis Sister     colon surgery  . Diverticulitis Brother      colon surgery   Social History:   reports that she  quit smoking about 46 years ago. Her smoking use included Cigarettes. She has never used smokeless tobacco. She reports that she does not drink alcohol or use illicit drugs.   REVIEW OF SYSTEMS : Negative except for see problem list  Physical Exam:   There were no vitals taken for this visit. There is no weight on file to calculate BMI.  Gen:  WDWN WF NAD  Neurological: Alert and oriented to person, place, and time. Motor and sensory function is grossly intact  Head: Normocephalic and atraumatic.  Eyes: Conjunctivae are normal. Pupils are equal, round, and reactive to light. No scleral icterus.  Neck: Normal range of motion. Neck supple. No tracheal deviation or thyromegaly present.  Cardiovascular:  SR without murmurs or gallops.  No carotid bruits Breast:  Not examined Respiratory: Effort normal.  No respiratory distress. No chest wall tenderness. Breath sounds normal.  No wheezes, rales or rhonchi.  Abdomen:  nontender GU:  Pelvic not repeated Musculoskeletal: Normal range of motion. Extremities are nontender. No cyanosis, edema or clubbing noted Lymphadenopathy: No cervical, preauricular, postauricular or axillary adenopathy is present Skin: Skin is warm and dry. No rash noted. No diaphoresis. No erythema. No pallor. Pscyh: Normal mood and affect. Behavior is normal. Judgment and thought content normal.   LABORATORY RESULTS: No results found for this or any previous visit (from the past 48 hour(s)).   RADIOLOGY RESULTS: No results found.  Problem List: Patient Active Problem List   Diagnosis Date Noted  . 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 Uterine polyp 08/06/2013  . Unspecified constipation 08/05/2013  . Thickened endometrium incidentally noted on CT 08/05/2013  . Diverticulitis of large intestine with abscess without bleeding 08/04/2013  . HTN (hypertension) 08/04/2013  . Ovarian cystic mass 02/01/2011  . Tubo-ovarian abscess 02/01/2011  . Hyponatremia 02/01/2011   . Hypokalemia 02/01/2011  . Barrett's esophagus 12/17/2008  . DIVERTICULITIS OF COLON 12/17/2008  . NONSPECIFIC ABN FINDING RAD & OTH EXAM GI TRACT 12/17/2008    Assessment & Plan: Colovesical fistula Colectomy and takedown of fistula.      Matt B. Hassell Done, MD, [facility] [phone] beeper [phone]  02/03/2014 2:31 PM

## 2014-02-04 ENCOUNTER — Inpatient Hospital Stay (HOSPITAL_COMMUNITY): Payer: Medicare Other

## 2014-02-04 ENCOUNTER — Inpatient Hospital Stay (HOSPITAL_COMMUNITY)
Admission: RE | Admit: 2014-02-04 | Discharge: 2014-02-13 | DRG: 654 | Disposition: A | Discharge status: Home / Self Care | Payer: Medicare Other | Source: Ambulatory Visit | Attending: Surgery | Admitting: Surgery

## 2014-02-04 ENCOUNTER — Inpatient Hospital Stay (HOSPITAL_COMMUNITY): Payer: Medicare Other | Admitting: Anesthesiology

## 2014-02-04 ENCOUNTER — Encounter (HOSPITAL_COMMUNITY)
Admission: RE | Disposition: A | Discharge status: Home / Self Care | Payer: Self-pay | Source: Ambulatory Visit | Attending: Surgery

## 2014-02-04 ENCOUNTER — Encounter (HOSPITAL_COMMUNITY): Payer: Self-pay | Admitting: *Deleted

## 2014-02-04 DIAGNOSIS — N321 Vesicointestinal fistula: Secondary | ICD-10-CM | POA: Diagnosis present

## 2014-02-04 DIAGNOSIS — R609 Edema, unspecified: Secondary | ICD-10-CM | POA: Diagnosis present

## 2014-02-04 DIAGNOSIS — Z823 Family history of stroke: Secondary | ICD-10-CM | POA: Diagnosis not present

## 2014-02-04 DIAGNOSIS — Z803 Family history of malignant neoplasm of breast: Secondary | ICD-10-CM | POA: Diagnosis not present

## 2014-02-04 DIAGNOSIS — K900 Celiac disease: Secondary | ICD-10-CM | POA: Diagnosis present

## 2014-02-04 DIAGNOSIS — Z79899 Other long term (current) drug therapy: Secondary | ICD-10-CM | POA: Diagnosis not present

## 2014-02-04 DIAGNOSIS — K219 Gastro-esophageal reflux disease without esophagitis: Secondary | ICD-10-CM | POA: Diagnosis present

## 2014-02-04 DIAGNOSIS — M1990 Unspecified osteoarthritis, unspecified site: Secondary | ICD-10-CM | POA: Diagnosis present

## 2014-02-04 DIAGNOSIS — Z8249 Family history of ischemic heart disease and other diseases of the circulatory system: Secondary | ICD-10-CM | POA: Diagnosis not present

## 2014-02-04 DIAGNOSIS — K625 Hemorrhage of anus and rectum: Secondary | ICD-10-CM | POA: Diagnosis present

## 2014-02-04 DIAGNOSIS — I10 Essential (primary) hypertension: Secondary | ICD-10-CM | POA: Diagnosis present

## 2014-02-04 DIAGNOSIS — Z87891 Personal history of nicotine dependence: Secondary | ICD-10-CM | POA: Diagnosis not present

## 2014-02-04 DIAGNOSIS — K2270 Barrett's esophagus without dysplasia: Secondary | ICD-10-CM | POA: Diagnosis present

## 2014-02-04 DIAGNOSIS — Y803 Surgical instruments, materials and physical medicine devices (including sutures) associated with adverse incidents: Secondary | ICD-10-CM

## 2014-02-04 DIAGNOSIS — K567 Ileus, unspecified: Secondary | ICD-10-CM | POA: Diagnosis not present

## 2014-02-04 DIAGNOSIS — K5732 Diverticulitis of large intestine without perforation or abscess without bleeding: Secondary | ICD-10-CM | POA: Diagnosis present

## 2014-02-04 DIAGNOSIS — Z79891 Long term (current) use of opiate analgesic: Secondary | ICD-10-CM | POA: Diagnosis not present

## 2014-02-04 HISTORY — PX: LAPAROSCOPIC PARTIAL COLECTOMY: SHX5907

## 2014-02-04 HISTORY — PX: VESICO-VAGINAL FISTULA REPAIR: SHX5129

## 2014-02-04 HISTORY — PX: TAKE DOWN OF INTESTINAL FISTULA: SHX6107

## 2014-02-04 LAB — URINALYSIS, ROUTINE W REFLEX MICROSCOPIC
Bilirubin Urine: NEGATIVE
Glucose, UA: NEGATIVE mg/dL
Ketones, ur: >80 mg/dL — AB
Nitrite: NEGATIVE
PROTEIN: NEGATIVE mg/dL
Specific Gravity, Urine: 1.013 (ref 1.005–1.030)
Urobilinogen, UA: 0.2 mg/dL (ref 0.0–1.0)
pH: 5.5 (ref 5.0–8.0)

## 2014-02-04 LAB — CBC
HEMATOCRIT: 35.7 % — AB (ref 36.0–46.0)
Hemoglobin: 11.6 g/dL — ABNORMAL LOW (ref 12.0–15.0)
MCH: 29.7 pg (ref 26.0–34.0)
MCHC: 32.5 g/dL (ref 30.0–36.0)
MCV: 91.5 fL (ref 78.0–100.0)
Platelets: 283 10*3/uL (ref 150–400)
RBC: 3.9 MIL/uL (ref 3.87–5.11)
RDW: 14.4 % (ref 11.5–15.5)
WBC: 12.2 10*3/uL — AB (ref 4.0–10.5)

## 2014-02-04 LAB — CREATININE, SERUM
Creatinine, Ser: 0.83 mg/dL (ref 0.50–1.10)
GFR calc Af Amer: 78 mL/min — ABNORMAL LOW (ref >90)
GFR, EST NON AFRICAN AMERICAN: 67 mL/min — AB (ref >90)

## 2014-02-04 LAB — URINE MICROSCOPIC-ADD ON

## 2014-02-04 LAB — HEMOGLOBIN A1C
HEMOGLOBIN A1C: 6 % — AB (ref <5.7)
MEAN PLASMA GLUCOSE: 126 mg/dL — AB (ref <117)

## 2014-02-04 LAB — TYPE AND SCREEN
ABO/RH(D): O POS
Antibody Screen: NEGATIVE

## 2014-02-04 SURGERY — LAPAROSCOPIC PARTIAL COLECTOMY
Anesthesia: General | Site: Abdomen

## 2014-02-04 MED ORDER — METOCLOPRAMIDE HCL 5 MG/ML IJ SOLN
INTRAMUSCULAR | Status: AC
  Filled 2014-02-04: qty 2

## 2014-02-04 MED ORDER — CHLORHEXIDINE GLUCONATE CLOTH 2 % EX PADS: 6.0000 | MEDICATED_PAD | Freq: Once | CUTANEOUS | Status: DC

## 2014-02-04 MED ORDER — LACTATED RINGERS IV SOLN: INTRAVENOUS | Status: DC

## 2014-02-04 MED ORDER — METOPROLOL SUCCINATE ER 50 MG PO TB24
50.0000 mg | ORAL_TABLET | Freq: Every day | ORAL | Status: DC
  Administered 2014-02-04 – 2014-02-05 (×2): 50 mg via ORAL
  Filled 2014-02-04 (×5): qty 1

## 2014-02-04 MED ORDER — HYDROMORPHONE HCL 1 MG/ML IJ SOLN
INTRAMUSCULAR | Status: AC
  Filled 2014-02-04: qty 1

## 2014-02-04 MED ORDER — PHENYLEPHRINE HCL 10 MG/ML IJ SOLN
INTRAMUSCULAR | Status: DC | PRN
  Administered 2014-02-04: 80 ug via INTRAVENOUS

## 2014-02-04 MED ORDER — METOCLOPRAMIDE HCL 5 MG/ML IJ SOLN
INTRAMUSCULAR | Status: DC | PRN
  Administered 2014-02-04: 10 mg via INTRAVENOUS

## 2014-02-04 MED ORDER — AMLODIPINE BESYLATE 5 MG PO TABS
5.0000 mg | ORAL_TABLET | Freq: Every day | ORAL | Status: DC
  Administered 2014-02-04 – 2014-02-12 (×3): 5 mg via ORAL
  Filled 2014-02-04 (×11): qty 1

## 2014-02-04 MED ORDER — GLYCOPYRROLATE 0.2 MG/ML IJ SOLN
INTRAMUSCULAR | Status: DC | PRN
  Administered 2014-02-04: 0.6 mg via INTRAVENOUS

## 2014-02-04 MED ORDER — DEXAMETHASONE SODIUM PHOSPHATE 10 MG/ML IJ SOLN
INTRAMUSCULAR | Status: AC
  Filled 2014-02-04: qty 1

## 2014-02-04 MED ORDER — HEPARIN SODIUM (PORCINE) 5000 UNIT/ML IJ SOLN
5000.0000 [IU] | Freq: Three times a day (TID) | INTRAMUSCULAR | Status: DC
  Administered 2014-02-04 – 2014-02-13 (×25): 5000 [IU] via SUBCUTANEOUS
  Filled 2014-02-04 (×32): qty 1

## 2014-02-04 MED ORDER — ETOMIDATE 2 MG/ML IV SOLN
INTRAVENOUS | Status: DC | PRN
  Administered 2014-02-04: 10 mg via INTRAVENOUS

## 2014-02-04 MED ORDER — CISATRACURIUM BESYLATE 20 MG/10ML IV SOLN
INTRAVENOUS | Status: AC
  Filled 2014-02-04: qty 10

## 2014-02-04 MED ORDER — SCOPOLAMINE 1 MG/3DAYS TD PT72
MEDICATED_PATCH | TRANSDERMAL | Status: DC | PRN
  Administered 2014-02-04: 1 via TRANSDERMAL

## 2014-02-04 MED ORDER — ONDANSETRON HCL 4 MG PO TABS
4.0000 mg | ORAL_TABLET | Freq: Four times a day (QID) | ORAL | Status: DC | PRN
  Filled 2014-02-04: qty 1

## 2014-02-04 MED ORDER — ONDANSETRON HCL 4 MG/2ML IJ SOLN
INTRAMUSCULAR | Status: DC | PRN
  Administered 2014-02-04: 4 mg via INTRAVENOUS

## 2014-02-04 MED ORDER — NEOSTIGMINE METHYLSULFATE 10 MG/10ML IV SOLN
INTRAVENOUS | Status: AC
  Filled 2014-02-04: qty 1

## 2014-02-04 MED ORDER — BUPIVACAINE LIPOSOME 1.3 % IJ SUSP
20.0000 mL | Freq: Once | INTRAMUSCULAR | Status: AC
  Administered 2014-02-04: 20 mL
  Filled 2014-02-04: qty 20

## 2014-02-04 MED ORDER — LACTATED RINGERS IR SOLN
Status: DC | PRN
  Administered 2014-02-04: 1000 mL

## 2014-02-04 MED ORDER — KCL IN DEXTROSE-NACL 20-5-0.45 MEQ/L-%-% IV SOLN
INTRAVENOUS | Status: DC
  Administered 2014-02-04 – 2014-02-05 (×2): via INTRAVENOUS
  Administered 2014-02-05 (×2): 100 mL/h via INTRAVENOUS
  Administered 2014-02-06: 20:00:00 via INTRAVENOUS
  Administered 2014-02-07: 1000 mL via INTRAVENOUS
  Administered 2014-02-07: 13:00:00 via INTRAVENOUS
  Administered 2014-02-08 – 2014-02-09 (×3): 100 mL/h via INTRAVENOUS
  Administered 2014-02-11 – 2014-02-13 (×3): via INTRAVENOUS
  Filled 2014-02-04 (×21): qty 1000

## 2014-02-04 MED ORDER — METOPROLOL SUCCINATE ER 50 MG PO TB24: 50.0000 mg | ORAL_TABLET | Freq: Every evening | ORAL | Status: DC

## 2014-02-04 MED ORDER — LACTATED RINGERS IV SOLN
INTRAVENOUS | Status: DC | PRN
  Administered 2014-02-04 (×2): via INTRAVENOUS

## 2014-02-04 MED ORDER — SODIUM CHLORIDE 0.9 % IJ SOLN
INTRAMUSCULAR | Status: AC
  Filled 2014-02-04: qty 10

## 2014-02-04 MED ORDER — 0.9 % SODIUM CHLORIDE (POUR BTL) OPTIME
TOPICAL | Status: DC | PRN
  Administered 2014-02-04 (×2): 2000 mL

## 2014-02-04 MED ORDER — METHYLENE BLUE 1 % INJ SOLN
INTRAMUSCULAR | Status: DC | PRN
  Administered 2014-02-04: 5 mL via SUBMUCOSAL

## 2014-02-04 MED ORDER — GLYCOPYRROLATE 0.2 MG/ML IJ SOLN
INTRAMUSCULAR | Status: AC
  Filled 2014-02-04: qty 3

## 2014-02-04 MED ORDER — CEFOTETAN DISODIUM-DEXTROSE 2-2.08 GM-% IV SOLR
INTRAVENOUS | Status: AC
  Filled 2014-02-04: qty 50

## 2014-02-04 MED ORDER — ONDANSETRON HCL 4 MG/2ML IJ SOLN
4.0000 mg | Freq: Four times a day (QID) | INTRAMUSCULAR | Status: DC | PRN
  Administered 2014-02-05 – 2014-02-12 (×7): 4 mg via INTRAVENOUS
  Filled 2014-02-04 (×8): qty 2

## 2014-02-04 MED ORDER — PHENYLEPHRINE 40 MCG/ML (10ML) SYRINGE FOR IV PUSH (FOR BLOOD PRESSURE SUPPORT)
PREFILLED_SYRINGE | INTRAVENOUS | Status: AC
  Filled 2014-02-04: qty 10

## 2014-02-04 MED ORDER — CISATRACURIUM BESYLATE (PF) 10 MG/5ML IV SOLN
INTRAVENOUS | Status: DC | PRN
  Administered 2014-02-04: 8 mg via INTRAVENOUS
  Administered 2014-02-04: 6 mg via INTRAVENOUS
  Administered 2014-02-04 (×2): 3 mg via INTRAVENOUS
  Administered 2014-02-04: 4 mg via INTRAVENOUS

## 2014-02-04 MED ORDER — CYCLOSPORINE 0.05 % OP EMUL
1.0000 [drp] | Freq: Two times a day (BID) | OPHTHALMIC | Status: DC
  Administered 2014-02-04 – 2014-02-13 (×18): 1 [drp] via OPHTHALMIC
  Filled 2014-02-04 (×24): qty 1

## 2014-02-04 MED ORDER — SODIUM CHLORIDE 0.9 % IJ SOLN
INTRAMUSCULAR | Status: AC
  Filled 2014-02-04: qty 3

## 2014-02-04 MED ORDER — ETOMIDATE 2 MG/ML IV SOLN
INTRAVENOUS | Status: AC
  Filled 2014-02-04: qty 10

## 2014-02-04 MED ORDER — PROPOFOL 10 MG/ML IV BOLUS
INTRAVENOUS | Status: AC
  Filled 2014-02-04: qty 20

## 2014-02-04 MED ORDER — SUFENTANIL CITRATE 50 MCG/ML IV SOLN
INTRAVENOUS | Status: AC
  Filled 2014-02-04: qty 1

## 2014-02-04 MED ORDER — LACTATED RINGERS IV SOLN
INTRAVENOUS | Status: DC | PRN
  Administered 2014-02-04 (×4): via INTRAVENOUS

## 2014-02-04 MED ORDER — PROMETHAZINE HCL 25 MG/ML IJ SOLN: 6.2500 mg | INTRAMUSCULAR | Status: DC | PRN

## 2014-02-04 MED ORDER — METOPROLOL TARTRATE 1 MG/ML IV SOLN
5.0000 mg | Freq: Four times a day (QID) | INTRAVENOUS | Status: DC
  Administered 2014-02-04 – 2014-02-06 (×6): 5 mg via INTRAVENOUS
  Filled 2014-02-04 (×11): qty 5

## 2014-02-04 MED ORDER — PROPOFOL 10 MG/ML IV BOLUS
INTRAVENOUS | Status: DC | PRN
  Administered 2014-02-04: 100 mg via INTRAVENOUS

## 2014-02-04 MED ORDER — SUFENTANIL CITRATE 50 MCG/ML IV SOLN
INTRAVENOUS | Status: DC | PRN
  Administered 2014-02-04: 10 ug via INTRAVENOUS
  Administered 2014-02-04 (×2): 5 ug via INTRAVENOUS
  Administered 2014-02-04 (×2): 10 ug via INTRAVENOUS

## 2014-02-04 MED ORDER — METHYLENE BLUE 1 % INJ SOLN
INTRAMUSCULAR | Status: AC
  Filled 2014-02-04: qty 10

## 2014-02-04 MED ORDER — MORPHINE SULFATE 2 MG/ML IJ SOLN
1.0000 mg | INTRAMUSCULAR | Status: DC | PRN
  Administered 2014-02-04 – 2014-02-12 (×29): 1 mg via INTRAVENOUS
  Filled 2014-02-04 (×30): qty 1

## 2014-02-04 MED ORDER — ONDANSETRON HCL 4 MG/2ML IJ SOLN
INTRAMUSCULAR | Status: AC
  Filled 2014-02-04: qty 2

## 2014-02-04 MED ORDER — DEXAMETHASONE SODIUM PHOSPHATE 10 MG/ML IJ SOLN
INTRAMUSCULAR | Status: DC | PRN
  Administered 2014-02-04: 10 mg via INTRAVENOUS

## 2014-02-04 MED ORDER — HEPARIN SODIUM (PORCINE) 5000 UNIT/ML IJ SOLN
5000.0000 [IU] | Freq: Once | INTRAMUSCULAR | Status: AC
  Administered 2014-02-04: 5000 [IU] via SUBCUTANEOUS
  Filled 2014-02-04: qty 1

## 2014-02-04 MED ORDER — SCOPOLAMINE 1 MG/3DAYS TD PT72
MEDICATED_PATCH | TRANSDERMAL | Status: AC
  Filled 2014-02-04: qty 1

## 2014-02-04 MED ORDER — HYDROMORPHONE HCL 1 MG/ML IJ SOLN
0.2500 mg | INTRAMUSCULAR | Status: DC | PRN
  Administered 2014-02-04 (×4): 0.5 mg via INTRAVENOUS

## 2014-02-04 MED ORDER — NEOSTIGMINE METHYLSULFATE 10 MG/10ML IV SOLN
INTRAVENOUS | Status: DC | PRN
  Administered 2014-02-04: 3.5 mg via INTRAVENOUS

## 2014-02-04 MED ORDER — DEXTROSE 5 % IV SOLN
2.0000 g | INTRAVENOUS | Status: AC
  Administered 2014-02-04: 2 g via INTRAVENOUS

## 2014-02-04 MED ORDER — METRONIDAZOLE IVPB CUSTOM: 1.0000 g | Freq: Once | INTRAVENOUS | Status: DC

## 2014-02-04 MED ORDER — DEXTROSE 5 % IV SOLN
2.0000 g | Freq: Two times a day (BID) | INTRAVENOUS | Status: AC
  Administered 2014-02-04: 2 g via INTRAVENOUS
  Filled 2014-02-04: qty 2

## 2014-02-04 SURGICAL SUPPLY — 109 items
ADAPTER GOLDBERG URETERAL (ADAPTER) IMPLANT
APPLIER CLIP 5 13 M/L LIGAMAX5 (MISCELLANEOUS)
APPLIER CLIP ROT 10 11.4 M/L (STAPLE)
APR CLP MED LRG 11.4X10 (STAPLE)
BLADE EXTENDED COATED 6.5IN (ELECTRODE) IMPLANT
BLADE HEX COATED 2.75 (ELECTRODE) ×4 IMPLANT
CABLE HIGH FREQUENCY MONO STRZ (ELECTRODE) ×4 IMPLANT
CATH AINSWORTH 30CC 24FR (CATHETERS) IMPLANT
CATH FOLEY 2WAY SLVR  5CC 22FR (CATHETERS)
CATH FOLEY 2WAY SLVR 5CC 22FR (CATHETERS) IMPLANT
CELLS DAT CNTRL 66122 CELL SVR (MISCELLANEOUS) IMPLANT
CLIP APPLIE 5 13 M/L LIGAMAX5 (MISCELLANEOUS) IMPLANT
CLIP APPLIE ROT 10 11.4 M/L (STAPLE) IMPLANT
CLIP LIGATING HEM O LOK PURPLE (MISCELLANEOUS) ×16 IMPLANT
CLIP LIGATING HEMO O LOK GREEN (MISCELLANEOUS) ×16 IMPLANT
COUNTER NEEDLE 20 DBL MAG RED (NEEDLE) ×4 IMPLANT
COVER SURGICAL LIGHT HANDLE (MISCELLANEOUS) IMPLANT
DECANTER SPIKE VIAL GLASS SM (MISCELLANEOUS) IMPLANT
DISSECTOR ROUND CHERRY 3/8 STR (MISCELLANEOUS) ×4 IMPLANT
DRAIN CHANNEL 10F 3/8 F FF (DRAIN) IMPLANT
DRAPE LAPAROSCOPIC ABDOMINAL (DRAPES) ×4 IMPLANT
DRAPE TABLE BACK 44X90 PK DISP (DRAPES) ×4 IMPLANT
DRAPE WARM FLUID 44X44 (DRAPE) ×4 IMPLANT
DRSG OPSITE POSTOP 3X4 (GAUZE/BANDAGES/DRESSINGS) IMPLANT
DRSG OPSITE POSTOP 4X10 (GAUZE/BANDAGES/DRESSINGS) IMPLANT
DRSG OPSITE POSTOP 4X6 (GAUZE/BANDAGES/DRESSINGS) IMPLANT
DRSG OPSITE POSTOP 4X8 (GAUZE/BANDAGES/DRESSINGS) ×4 IMPLANT
ELECT LIGASURE SHORT 9 REUSE (ELECTRODE) ×4 IMPLANT
ELECT PENCIL ROCKER SW 15FT (MISCELLANEOUS) ×4 IMPLANT
ELECT REM PT RETURN 9FT ADLT (ELECTROSURGICAL) ×8
ELECTRODE REM PT RTRN 9FT ADLT (ELECTROSURGICAL) ×4 IMPLANT
EVACUATOR SILICONE 100CC (DRAIN) IMPLANT
FLOSEAL 10ML (HEMOSTASIS) IMPLANT
GAUZE SPONGE 2X2 8PLY STRL LF (GAUZE/BANDAGES/DRESSINGS) IMPLANT
GAUZE SPONGE 4X4 12PLY STRL (GAUZE/BANDAGES/DRESSINGS) ×4 IMPLANT
GAUZE SPONGE 4X4 16PLY XRAY LF (GAUZE/BANDAGES/DRESSINGS) ×4 IMPLANT
GLOVE BIOGEL M 8.0 STRL (GLOVE) ×8 IMPLANT
GLOVE BIOGEL M STRL SZ7.5 (GLOVE) ×4 IMPLANT
GOWN STRL REUS W/TWL XL LVL3 (GOWN DISPOSABLE) ×28 IMPLANT
HEMOSTAT SURGICEL 4X8 (HEMOSTASIS) IMPLANT
KIT BASIN OR (CUSTOM PROCEDURE TRAY) ×4 IMPLANT
LEGGING LITHOTOMY PAIR STRL (DRAPES) ×4 IMPLANT
LIGASURE IMPACT 36 18CM CVD LR (INSTRUMENTS) ×4 IMPLANT
LOOP VESSEL MAXI BLUE (MISCELLANEOUS) ×4 IMPLANT
LUBRICANT JELLY K Y 4OZ (MISCELLANEOUS) ×4 IMPLANT
NS IRRIG 1000ML POUR BTL (IV SOLUTION) ×8 IMPLANT
PACK COLON (CUSTOM PROCEDURE TRAY) ×4 IMPLANT
PACK GENERAL/GYN (CUSTOM PROCEDURE TRAY) ×4 IMPLANT
PEN SKIN MARKING BROAD (MISCELLANEOUS) ×4 IMPLANT
PENCIL BUTTON HOLSTER BLD 10FT (ELECTRODE) ×4 IMPLANT
PLUG CATH AND CAP STER (CATHETERS) ×4 IMPLANT
RELOAD LINEAR CUT PROX 55 BLUE (ENDOMECHANICALS) IMPLANT
RTRCTR WOUND ALEXIS 18CM MED (MISCELLANEOUS)
SCISSORS LAP 5X45 EPIX DISP (ENDOMECHANICALS) ×4 IMPLANT
SCRUB PCMX 4 OZ (MISCELLANEOUS) ×4 IMPLANT
SET IRRIG TUBING LAPAROSCOPIC (IRRIGATION / IRRIGATOR) ×4 IMPLANT
SHEARS CURVED HARMONIC AC 45CM (MISCELLANEOUS) ×4 IMPLANT
SHEET LAVH (DRAPES) IMPLANT
SLEEVE XCEL OPT CAN 5 100 (ENDOMECHANICALS) IMPLANT
SPONGE GAUZE 2X2 STER 10/PKG (GAUZE/BANDAGES/DRESSINGS)
SPONGE LAP 18X18 X RAY DECT (DISPOSABLE) ×16 IMPLANT
SPONGE SURGIFOAM ABS GEL 100 (HEMOSTASIS) IMPLANT
STAPLER CUT CVD 40MM BLUE (STAPLE) ×4 IMPLANT
STAPLER CUT RELOAD BLUE (STAPLE) ×4 IMPLANT
STAPLER CUT RELOAD GREEN (STAPLE) ×4 IMPLANT
STAPLER GUN LINEAR PROX 60 (STAPLE) ×4 IMPLANT
STAPLER PROXIMATE 55 BLUE (STAPLE) IMPLANT
STAPLER VISISTAT 35W (STAPLE) ×4 IMPLANT
STENT SINGLE 7F (STENTS) ×4 IMPLANT
SUT CHROMIC 4 0 RB 1X27 (SUTURE) ×8 IMPLANT
SUT ETHILON 3 0 PS 1 (SUTURE) ×12 IMPLANT
SUT MON AB 4-0 SH 27 (SUTURE) ×8 IMPLANT
SUT NOVA 1 T20/GS 25DT (SUTURE) ×12 IMPLANT
SUT PDS AB 1 CTX 36 (SUTURE) ×16 IMPLANT
SUT PDS AB 1 TP1 96 (SUTURE) IMPLANT
SUT PDS AB 3-0 SH 27 (SUTURE) ×4 IMPLANT
SUT PDS AB 4-0 PS2 18 (SUTURE) IMPLANT
SUT PROLENE 2 0 KS (SUTURE) IMPLANT
SUT SILK 0 (SUTURE) ×6
SUT SILK 0 30XBRD TIE 6 (SUTURE) ×6 IMPLANT
SUT SILK 0 CT 1 30 (SUTURE) ×4 IMPLANT
SUT SILK 2 0 (SUTURE) ×8
SUT SILK 2 0 SH CR/8 (SUTURE) ×4 IMPLANT
SUT SILK 2-0 18XBRD TIE 12 (SUTURE) ×2 IMPLANT
SUT SILK 2-0 30XBRD TIE 12 (SUTURE) ×4 IMPLANT
SUT SILK 3 0 (SUTURE) ×3
SUT SILK 3 0 SH CR/8 (SUTURE) ×12 IMPLANT
SUT SILK 3-0 18XBRD TIE 12 (SUTURE) ×2 IMPLANT
SUT VIC AB 1 CT1 36 (SUTURE) ×12 IMPLANT
SUT VIC AB 2-0 UR5 27 (SUTURE) ×4 IMPLANT
SUT VIC AB 3-0 SH 27 (SUTURE) ×8
SUT VIC AB 3-0 SH 27XBRD (SUTURE) ×4 IMPLANT
SUT VIC AB 4-0 RB1 27 (SUTURE) ×4
SUT VIC AB 4-0 RB1 27XBRD (SUTURE) ×4 IMPLANT
SUT VICRYL 3-0 RB1 18 ABS (SUTURE) IMPLANT
SYR 30ML LL (SYRINGE) ×4 IMPLANT
SYR CONTROL 10ML LL (SYRINGE) ×4 IMPLANT
SYS LAPSCP GELPORT 120MM (MISCELLANEOUS) ×4
SYSTEM LAPSCP GELPORT 120MM (MISCELLANEOUS) ×2 IMPLANT
SYSTEM UROSTOMY GENTLE TOUCH (WOUND CARE) IMPLANT
TAPE UMBILICAL COTTON 1/8X30 (MISCELLANEOUS) IMPLANT
TOWEL OR 17X26 10 PK STRL BLUE (TOWEL DISPOSABLE) ×4 IMPLANT
TRAY FOLEY CATH 14FRSI W/METER (CATHETERS) ×4 IMPLANT
TROCAR XCEL NON-BLD 11X100MML (ENDOMECHANICALS) IMPLANT
TROCAR XCEL NON-BLD 5MMX100MML (ENDOMECHANICALS) ×4 IMPLANT
TUBING FILTER THERMOFLATOR (ELECTROSURGICAL) ×4 IMPLANT
URINEMETER 200ML W/220 (MISCELLANEOUS) IMPLANT
WATER STERILE IRR 1500ML POUR (IV SOLUTION) ×4 IMPLANT
YANKAUER SUCT BULB TIP NO VENT (SUCTIONS) ×4 IMPLANT

## 2014-02-04 NOTE — Op Note (Signed)
Pre-operative diagnosis :   Colo-vesicle fistula  Postoperative diagnosis:  Same  Operation:  Closure of Colo-vesicle fistula  Surgeon:  S. Gaynelle Arabian, MD  First assistant:  Rockne Coons , MD  Anesthesia:  General LMA  Preparation:  After appropriately anesthesia, the patient was brought to the operative room, placed on the operating table in the dorsal supine position where general endotracheal anesthesia was introduced. She underwent abdominal expiration, and bowel surgery per Dr. Hassell Done. Following bowel surgery, urology was called, for colovesical fistula extirpation.  Review history:  The patient is a  74 year old female, with a history of diverticulitis and abscess formation 6 months ago, with subsequent recurrent UTI, gross hematuria, with evaluation showing colovesical fistula in the posterior portion of the bladder. Repeat evaluation could not pinpoint the area of bowel involvement. The patient is now for abdominal exploration, and repair of colovesical fistula.   Likelihood of Success: Excellent.     TIME-OUT observed.:  Procedure:  With the colon retracted, the uterus and left ovary and infundibulum were identified. These were stuck to the bladder with inflammatory process noted. The area in the posterior portion of bladder was identified with the fistula formation. With sharp and blunt dissection, the left ovary and uterus were dissected free from the bladder without bleeding. His elected to leave the uterus and the left ovary intact. Allis clamp was placed over the fistula, with Foley catheter, 18 French with 10 mL of sterile water in the balloon left in the bladder. A 10 cm area of excision was then accomplished in order to completely excise the area of fistula, and edema. This tissue was sent to laboratory for examination.  Using 3-0 Vicryl suture, the mucosa was closed in running fashion. Using 2-0 Vicryl suture, the seromuscular layer was closed in running fashion. Methylene blue  was placed and a liter of sterile water, and this was hooked to the Foley catheter and placed in the bladder under gravity. The bladder was distended, and this displayed a watertight anastomosis. The bladder was then hooked to gravity drainage. The wound was irrigated with saline, and then the patient underwent bowel surgery by Dr. Hassell Done.

## 2014-02-04 NOTE — Interval H&P Note (Signed)
History and Physical Interval Note:  02/04/2014 7:27 AM  [name]  has presented today for surgery, with the diagnosis of DIVERTICULAR FISTULA  The various methods of treatment have been discussed with the patient and family. After consideration of risks, benefits and other options for treatment, the patient has consented to  Procedure(s): LAPAROSCOPIC PARTIAL COLECTOMY (N/A) TAKE DOWN OF COLOVESICAL FISTULA (N/A) CYSTECTOMY PARTIAL (N/A) as a surgical intervention .  The patient's history has been reviewed, patient examined, no change in status, stable for surgery.  I have reviewed the patient's chart and labs.  Questions were answered to the patient's satisfaction.     Meiah Zamudio B

## 2014-02-04 NOTE — Anesthesia Postprocedure Evaluation (Signed)
  Anesthesia Post-op Note  Patient: [name]  Procedure(s) Performed: Procedure(s): LAPAROSCOPIC PARTIAL COLECTOMY (N/A) TAKE DOWN OF COLOVESICAL FISTULA (N/A) COLOVESICAL FISTULA REPAIR  (N/A)  Patient Location: PACU  Anesthesia Type:General  Level of Consciousness: awake and alert   Airway and Oxygen Therapy: Patient Spontanous Breathing  Post-op Pain: mild  Post-op Assessment: Post-op Vital signs reviewed  Post-op Vital Signs: stable  Last Vitals:  Filed Vitals:   02/04/14 1330  BP: 163/79  Pulse:   Temp:   Resp: 15    Complications: No apparent anesthesia complications

## 2014-02-04 NOTE — Anesthesia Preprocedure Evaluation (Addendum)
Anesthesia Evaluation  Patient identified by MRN, date of birth, ID band Patient awake    Reviewed: Allergy & Precautions, H&P , Patient's Chart, lab work & pertinent test results, reviewed documented beta blocker date and time   History of Anesthesia Complications (+) history of anesthetic complications  Airway Mallampati: II  TM Distance: >3 FB Neck ROM: full    Dental no notable dental hx. (+) Teeth Intact   Pulmonary former smoker,  breath sounds clear to auscultation  Pulmonary exam normal       Cardiovascular hypertension, Pt. on home beta blockers and Pt. on medications + Valvular Problems/Murmurs Rhythm:regular Rate:Normal     Neuro/Psych  Neuromuscular disease    GI/Hepatic Neg liver ROS, hiatal hernia, PUD, GERD-  Medicated,  Endo/Other  negative endocrine ROS  Renal/GU      Musculoskeletal   Abdominal   Peds  Hematology negative hematology ROS (+)   Anesthesia Other Findings   Reproductive/Obstetrics                           Anesthesia Physical Anesthesia Plan  ASA: II  Anesthesia Plan: General   Post-op Pain Management:    Induction: Intravenous  Airway Management Planned: Oral ETT  Additional Equipment:   Intra-op Plan:   Post-operative Plan: Extubation in OR  Informed Consent: I have reviewed the patients History and Physical, chart, labs and discussed the procedure including the risks, benefits and alternatives for the proposed anesthesia with the patient or authorized representative who has indicated his/her understanding and acceptance.   Dental advisory given  Plan Discussed with:   Anesthesia Plan Comments:         Anesthesia Quick Evaluation

## 2014-02-04 NOTE — Anesthesia Procedure Notes (Signed)
Procedure Name: Intubation Date/Time: 02/04/2014 7:44 AM Performed by: Johnathan Hausen A Pre-anesthesia Checklist: Emergency Drugs available, Patient identified, Suction available, Patient being monitored and Timeout performed Patient Re-evaluated:Patient Re-evaluated prior to inductionOxygen Delivery Method: Circle system utilized Preoxygenation: Pre-oxygenation with 100% oxygen Intubation Type: Combination inhalational/ intravenous induction Laryngoscope Size: Mac and 4 Grade View: Grade I Tube type: Oral Tube size: 7.5 mm Number of attempts: 1 Airway Equipment and Method: Stylet Placement Confirmation: ETT inserted through vocal cords under direct vision,  breath sounds checked- equal and bilateral and positive ETCO2 Secured at: 22 cm Tube secured with: Tape Dental Injury: Teeth and Oropharynx as per pre-operative assessment

## 2014-02-04 NOTE — Op Note (Signed)
Surgeon: Kaylyn Lim, MD, FACS  Asst:  Alphonsa Overall M.D. FACS  Anes:  Gen.  Procedure: Laparoscopically assisted mobilization of the left colon and takedown of colovesical fistula and sigmoid resection, assist Dr. Minus Liberty in resection of bladder and closure, primary Fara Olden Baker-type side-to-side and 28 stapled EEA anastomosis at about 10 cm. Placement of Blake drain in the pelvis  Diagnosis: Diverticulitis with colovesical fistula  Complications: None noted  EBL:   30 cc  Drains: 119 Blake drain in the pelvis brought out through the right lower quadrant  Description of Procedure:  The patient was taken to OR 11 at [facility].  After anesthesia was administered and the patient was prepped a timeout was performed.  With the patient in stirrups the abdomen was prepped with ChloraPrep as per the colon protocol. I entered the abdomen to the left upper quadrant with a 5 mm Optiview without difficulty3I placed 2 other trochars one under the right midline and one in the midline in the suprapubic region. Through these a began mobilizing the splenic flexure did so with the harmonic scalpel. A family some redundancy of her descending colon which unfolded and then went up to the splenic flexure and freed that and got some mobilization toward the midline. I then worked my way down into the pelvis where the colon was really stuck densely to the left side what amounted to the bladder and uterus. At that point I made an 9 cm incision in the lower midline and using a wound protector went down the pelvis using of finger fracture technique to take this sigmoid diverticular mass off the left pelvic sidewall. At came through the fistula and then separated from the uterus. I did this with blunt dissection. Bleeding was controlled with electrocautery of the Harmonic Scalpel. I then was able to get down below this area which was down near the peritoneal reflection. Elected divide the bowel proximally with a contour  stapler and then go to the mesentery with a LigaSure.  I got below I used a green load to fire across the distal colon using again the contour stapler. Specimen was removed marked and sent to pathology and on gross inspection was felt to be consistent with sigmoid diverticulitis.  Dr. Gaynelle Arabian came in and resected the fibrotic fistula in the bladder and tested his two layer closure with methylene blue and did not leak. He is dictated this under separate op note.  I then moved toward creating a primary anastomosis using a [place] technique and placing the 29 anvil out through the antimesenteric border of the bowel. I then stapled off the the opening at the and. I then went below and Dr. Lucia Gaskins manage the superior portion. A brother 15 Ethicon Stealth stapler and the post came out just below the staple line. The anvil was joined to the Stealth closed in the medium range of staple height and fired. 2 rings were present and intact. I then inflated the colon under pressure and under water. No bubbles were seen. Elected to put a 61 Blake drain into the pelvis.  The peritoneum was closed in a running 2-0 PDS. Fascia was closed interrupted #1 Novafil. It was injected with Exparel irrigated and closed with staples. We followed the colon protocol including changing gowns and gloves and instruments between phases of the case. The patient tolerated the procedure well and was taken to the PACU in stable condition.     Matt B. Hassell Done, [place], [facility], [place]

## 2014-02-04 NOTE — Transfer of Care (Signed)
Immediate Anesthesia Transfer of Care Note  Patient: [name]  Procedure(s) Performed: Procedure(s): LAPAROSCOPIC PARTIAL COLECTOMY (N/A) TAKE DOWN OF COLOVESICAL FISTULA (N/A) COLOVESICAL FISTULA REPAIR  (N/A)  Patient Location: PACU  Anesthesia Type:General  Level of Consciousness: awake, sedated and patient cooperative  Airway & Oxygen Therapy: Patient Spontanous Breathing and Patient connected to face mask oxygen  Post-op Assessment: Report given to PACU RN and Post -op Vital signs reviewed and stable  Post vital signs: Reviewed and stable  Complications: No apparent anesthesia complications

## 2014-02-04 NOTE — H&P (View-Only) (Signed)
Chief Complaint:  Colovesical fistula  History of Present Illness:  [name] is an 74 y.o. 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  Has been having more pain and arrangements made for colectomy and takedown of colovesical fistula with Drs. Chauncy Lean   Past Medical History  Diagnosis Date  . Hypertension   . Celiac disease     per Dr. Fuller Plan  . Diverticulitis     and diverticulosis in sigmoid colon   . GERD (gastroesophageal reflux disease)   . Erosive esophagitis   . Barrett's esophagus   . Hiatal hernia   . Blood in urine     has symptoms of UTI- burning and aching lower abdomen  . Heart murmur     DOES NOT CAUSE ANY PROBLEMS  . Degenerative joint disease     knees  . Colovesical fistula   . Rheumatic fever     as child  . Anemia     as child  . Complication of anesthesia 1966    adverse reaction to saddle block  . PONV (postoperative nausea and vomiting)     Past Surgical History  Procedure Laterality Date  . Appendectomy    . Carpal tunnel release Left   . Knee arthroscopy Right   . Bunionectomy Right   . Hysteroscopy w/d&c N/A 11/06/2013    Procedure: DILATATION AND CURETTAGE /HYSTEROSCOPY;  Surgeon: Daria Pastures, MD;  Location: [facility];  Service: Gynecology;  Laterality: N/A;  . Cystoscopy w/ retrogrades Left 12/23/2013    Procedure: CYSTOSCOPY WITH LEFT RETROGRADE PYELOGRAM;  Surgeon: Ailene Rud, MD;  Location: [facility];  Service: Urology;  Laterality: Left;  . Transurethral resection of bladder tumor N/A 12/23/2013    Procedure: TRANSURETHRAL RESECTION OF BLADDER TUMOR (TURBT);  Surgeon: Ailene Rud, MD;  Location: [facility];  Service: Urology;  Laterality: N/A;  . Tonsillectomy      x2    Current Outpatient Prescriptions  Medication Sig Dispense Refill  . acidophilus (RISAQUAD) CAPS Take 1 capsule by mouth every evening.     Marland Kitchen amLODipine (NORVASC) 5 MG tablet Take 5 mg by mouth 2 (two) times daily.     .  Cholecalciferol (VITAMIN D3) 2000 UNITS TABS Take 1 tablet by mouth daily at 12 noon.     . cycloSPORINE (RESTASIS) 0.05 % ophthalmic emulsion Place 1 drop into both eyes 2 (two) times daily.    Marland Kitchen losartan (COZAAR) 100 MG tablet Take 1 tablet (100 mg total) by mouth daily. (Patient not taking: Reported on 01/30/2014) 30 tablet 0  . losartan (COZAAR) 100 MG tablet Take 100 mg by mouth every morning.     . metoprolol succinate (TOPROL-XL) 50 MG 24 hr tablet Take 50 mg by mouth every evening.     . pantoprazole (PROTONIX) 40 MG tablet Take 40 mg by mouth 2 (two) times daily.    . phenazopyridine (PYRIDIUM) 200 MG tablet Take 1 tablet (200 mg total) by mouth 3 (three) times daily as needed for pain. (Patient not taking: Reported on 01/30/2014) 30 tablet 3  . polyethylene glycol (MIRALAX / GLYCOLAX) packet Take 17 g by mouth daily as needed for moderate constipation.    . traMADol (ULTRAM) 50 MG tablet Take 50 mg by mouth 4 (four) times daily as needed for moderate pain.    . traMADol-acetaminophen (ULTRACET) 37.5-325 MG per tablet Take 1 tablet by mouth every 6 (six) hours as needed. (Patient not taking: Reported on 01/30/2014) 30  tablet 0  . trimethoprim (TRIMPEX) 100 MG tablet Take 1 tablet (100 mg total) by mouth 1 day or 1 dose. (Patient taking differently: Take 100 mg by mouth daily. ) 30 tablet 1  . vitamin B-12 (CYANOCOBALAMIN) 1000 MCG tablet Take 1,000 mcg by mouth daily at 12 noon.      No current facility-administered medications for this visit.   Gluten meal; Ace inhibitors; Ciprofloxacin; Ibuprofen; Levofloxacin; and Simvastatin Family History  Problem Relation Age of Onset  . Breast cancer Sister   . Heart disease Father   . Stroke Mother     multiple  . Colon cancer Neg Hx   . Esophageal cancer Neg Hx   . Rectal cancer Neg Hx   . Stomach cancer Neg Hx   . Diverticulitis Sister     colon surgery  . Diverticulitis Brother      colon surgery   Social History:   reports that she  quit smoking about 46 years ago. Her smoking use included Cigarettes. She has never used smokeless tobacco. She reports that she does not drink alcohol or use illicit drugs.   REVIEW OF SYSTEMS : Negative except for see problem list  Physical Exam:   There were no vitals taken for this visit. There is no weight on file to calculate BMI.  Gen:  WDWN WF NAD  Neurological: Alert and oriented to person, place, and time. Motor and sensory function is grossly intact  Head: Normocephalic and atraumatic.  Eyes: Conjunctivae are normal. Pupils are equal, round, and reactive to light. No scleral icterus.  Neck: Normal range of motion. Neck supple. No tracheal deviation or thyromegaly present.  Cardiovascular:  SR without murmurs or gallops.  No carotid bruits Breast:  Not examined Respiratory: Effort normal.  No respiratory distress. No chest wall tenderness. Breath sounds normal.  No wheezes, rales or rhonchi.  Abdomen:  nontender GU:  Pelvic not repeated Musculoskeletal: Normal range of motion. Extremities are nontender. No cyanosis, edema or clubbing noted Lymphadenopathy: No cervical, preauricular, postauricular or axillary adenopathy is present Skin: Skin is warm and dry. No rash noted. No diaphoresis. No erythema. No pallor. Pscyh: Normal mood and affect. Behavior is normal. Judgment and thought content normal.   LABORATORY RESULTS: No results found for this or any previous visit (from the past 48 hour(s)).   RADIOLOGY RESULTS: No results found.  Problem List: Patient Active Problem List   Diagnosis Date Noted  . 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 Uterine polyp 08/06/2013  . Unspecified constipation 08/05/2013  . Thickened endometrium incidentally noted on CT 08/05/2013  . Diverticulitis of large intestine with abscess without bleeding 08/04/2013  . HTN (hypertension) 08/04/2013  . Ovarian cystic mass 02/01/2011  . Tubo-ovarian abscess 02/01/2011  . Hyponatremia 02/01/2011   . Hypokalemia 02/01/2011  . Barrett's esophagus 12/17/2008  . DIVERTICULITIS OF COLON 12/17/2008  . NONSPECIFIC ABN FINDING RAD & OTH EXAM GI TRACT 12/17/2008    Assessment & Plan: Colovesical fistula Colectomy and takedown of fistula.      Matt B. Hassell Done, MD, [facility] [phone] beeper [phone]  02/03/2014 2:31 PM

## 2014-02-05 ENCOUNTER — Encounter (HOSPITAL_COMMUNITY): Payer: Self-pay | Admitting: Surgery

## 2014-02-05 LAB — BASIC METABOLIC PANEL
ANION GAP: 10 (ref 5–15)
BUN: 11 mg/dL (ref 6–23)
CHLORIDE: 104 mmol/L (ref 96–112)
CO2: 23 mmol/L (ref 19–32)
CREATININE: 0.65 mg/dL (ref 0.50–1.10)
Calcium: 8.4 mg/dL (ref 8.4–10.5)
GFR calc Af Amer: >90 mL/min (ref >90)
GFR, EST NON AFRICAN AMERICAN: 85 mL/min — AB (ref >90)
Glucose, Bld: 168 mg/dL — ABNORMAL HIGH (ref 70–99)
POTASSIUM: 4.2 mmol/L (ref 3.5–5.1)
SODIUM: 137 mmol/L (ref 135–145)

## 2014-02-05 LAB — CBC
HCT: 32 % — ABNORMAL LOW (ref 36.0–46.0)
HEMOGLOBIN: 10.5 g/dL — AB (ref 12.0–15.0)
MCH: 29.7 pg (ref 26.0–34.0)
MCHC: 32.8 g/dL (ref 30.0–36.0)
MCV: 90.7 fL (ref 78.0–100.0)
Platelets: 254 10*3/uL (ref 150–400)
RBC: 3.53 MIL/uL — AB (ref 3.87–5.11)
RDW: 14.6 % (ref 11.5–15.5)
WBC: 11.4 10*3/uL — ABNORMAL HIGH (ref 4.0–10.5)

## 2014-02-05 NOTE — Progress Notes (Signed)
Patient ID: [name], female   DOB: [birth year], 74 y.o.   MRN: [id number] [facility] Progress Note:   1 Day Post-Op  Subjective: Mental status is clear.  NG removed.  Objective: Vital signs in last 24 hours: Temp:  [97.6 F (36.4 C)-98.9 F (37.2 C)] 98.9 F (37.2 C) (01/27 1248) Pulse Rate:  [67-81] 67 (01/27 1248) Resp:  [16] 16 (01/27 1248) BP: (134-157)/(54-96) 134/54 mmHg (01/27 1248) SpO2:  [98 %-99 %] 99 % (01/27 1248)  Intake/Output from previous day: 01/26 0701 - 01/27 0700 In: 6160 [P.O.:60; I.V.:6050; IV Piggyback:50] Out: 2870 [Urine:2360; Drains:260; Blood:250] Intake/Output this shift: Total I/O In: 60 [P.O.:60] Out: 585 [Urine:500; Emesis/NG output:75; Drains:10]  Physical Exam: Work of breathing is normal.  Pain controlled.  No flautus. JP in place  Lab Results:  Results for orders placed or performed during the hospital encounter of 02/04/14 (from the past 48 hour(s))  Hemoglobin A1c     Status: Abnormal   Collection Time: 02/04/14  6:30 AM  Result Value Ref Range   Hgb A1c MFr Bld 6.0 (H) <5.7 %    Comment: (NOTE)                                                                       According to the ADA Clinical Practice Recommendations for 2011, when HbA1c is used as a screening test:  >=6.5%   Diagnostic of Diabetes Mellitus           (if abnormal result is confirmed) 5.7-6.4%   Increased risk of developing Diabetes Mellitus References:Diagnosis and Classification of Diabetes Mellitus,Diabetes WUJW,1191,47(WGNFA 1):S62-S69 and Standards of Medical Care in         Diabetes - 2011,Diabetes Care,2011,34 (Suppl 1):S11-S61.    Mean Plasma Glucose 126 (H) <117 mg/dL    Comment: Performed at [facility]  CBC     Status: Abnormal   Collection Time: 02/04/14  3:40 PM  Result Value Ref Range   WBC 12.2 (H) 4.0 - 10.5 K/uL   RBC 3.90 3.87 - 5.11 MIL/uL   Hemoglobin 11.6 (L) 12.0 - 15.0 g/dL   HCT 35.7 (L) 36.0 - 46.0 %   MCV 91.5  78.0 - 100.0 fL   MCH 29.7 26.0 - 34.0 pg   MCHC 32.5 30.0 - 36.0 g/dL   RDW 14.4 11.5 - 15.5 %   Platelets 283 150 - 400 K/uL  Creatinine, serum     Status: Abnormal   Collection Time: 02/04/14  3:40 PM  Result Value Ref Range   Creatinine, Ser 0.83 0.50 - 1.10 mg/dL   GFR calc non Af Amer 67 (L) >90 mL/min   GFR calc Af Amer 78 (L) >90 mL/min    Comment: (NOTE) The eGFR has been calculated using the CKD EPI equation. This calculation has not been validated in all clinical situations. eGFR's persistently <90 mL/min signify possible Chronic Kidney Disease.   Basic metabolic panel     Status: Abnormal   Collection Time: 02/05/14  5:37 AM  Result Value Ref Range   Sodium 137 135 - 145 mmol/L   Potassium 4.2 3.5 - 5.1 mmol/L   Chloride 104 96 - 112 mmol/L   CO2 23 19 - 32  mmol/L   Glucose, Bld 168 (H) 70 - 99 mg/dL   BUN 11 6 - 23 mg/dL    Comment: DELTA CHECK NOTED REPEATED TO VERIFY    Creatinine, Ser 0.65 0.50 - 1.10 mg/dL   Calcium 8.4 8.4 - 10.5 mg/dL   GFR calc non Af Amer 85 (L) >90 mL/min   GFR calc Af Amer >90 >90 mL/min    Comment: (NOTE) The eGFR has been calculated using the CKD EPI equation. This calculation has not been validated in all clinical situations. eGFR's persistently <90 mL/min signify possible Chronic Kidney Disease.    Anion gap 10 5 - 15  CBC     Status: Abnormal   Collection Time: 02/05/14  6:45 AM  Result Value Ref Range   WBC 11.4 (H) 4.0 - 10.5 K/uL   RBC 3.53 (L) 3.87 - 5.11 MIL/uL   Hemoglobin 10.5 (L) 12.0 - 15.0 g/dL   HCT 32.0 (L) 36.0 - 46.0 %   MCV 90.7 78.0 - 100.0 fL   MCH 29.7 26.0 - 34.0 pg   MCHC 32.8 30.0 - 36.0 g/dL   RDW 14.6 11.5 - 15.5 %   Platelets 254 150 - 400 K/uL    Radiology/Results: Dg Abd Portable 1v  02/04/2014   CLINICAL DATA:  Incorrect instrument count.  EXAM: PORTABLE ABDOMEN - 1 VIEW  COMPARISON:  None.  FINDINGS: The bowel gas pattern is normal. Nasogastric tube tip is seen in expected position of  distal stomach. Surgical staples seen in left upper quadrant of the abdomen. Midline surgical staples are seen in the pelvis. Surgical drain is also seen in the pelvis. No other radiopaque foreign body is noted.  IMPRESSION: No other radiopaque foreign body seen other than surgical staples.   Electronically Signed   By: [name] M.D.   On: 02/04/2014 12:32    Anti-infectives: Anti-infectives    Start     Dose/Rate Route Frequency Ordered Stop   02/04/14 1800  cefoTEtan (CEFOTAN) 2 g in dextrose 5 % 50 mL IVPB     2 g100 mL/hr over 30 Minutes Intravenous Every 12 hours 02/04/14 1450 02/04/14 1829   02/04/14 0600  cefoTEtan (CEFOTAN) 2 g in dextrose 5 % 50 mL IVPB     2 g100 mL/hr over 30 Minutes Intravenous On call to O.R. 02/04/14 0553 02/04/14 0730      Assessment/Plan: Problem List: Patient Active Problem List   Diagnosis Date Noted  . 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 Uterine polyp 08/06/2013  . Unspecified constipation 08/05/2013  . Thickened endometrium incidentally noted on CT 08/05/2013  . Diverticulitis of large intestine with abscess without bleeding 08/04/2013  . HTN (hypertension) 08/04/2013  . Ovarian cystic mass 02/01/2011  . Tubo-ovarian abscess 02/01/2011  . Hyponatremia 02/01/2011  . Hypokalemia 02/01/2011  . Barrett's esophagus 12/17/2008  . DIVERTICULITIS OF COLON 12/17/2008  . NONSPECIFIC ABN FINDING RAD & OTH EXAM GI TRACT 12/17/2008    Doing well thus far.  1 Day Post-Op    LOS: 1 day   Matt B. Hassell Done, MD, [facility] [phone] beeper [phone]  02/05/2014 3:45 PM

## 2014-02-05 NOTE — Care Management Note (Signed)
    Page 1 of 1   02/12/2014     12:56:09 PM CARE MANAGEMENT NOTE 02/12/2014  Patient:  [name], [name]   Account Number:  [id number]  Date Initiated:  02/05/2014  Documentation initiated by:  Sunday Spillers  Subjective/Objective Assessment:   74 yo female admitted s/p Closure of Colo-vesicle fistula. PTA lived at home with spouse.     Action/Plan:   Home when stable   Anticipated DC Date:  02/13/2014   Anticipated DC Plan:  [place]  CM consult      Choice offered to / List presented to:             Status of service:  Completed, signed off Medicare Important Message given?  YES (If response is "NO", the following Medicare IM given date fields will be blank) Date Medicare IM given:  02/12/2014 Medicare IM given by:  [facility] Date Additional Medicare IM given:   Additional Medicare IM given by:    Discharge Disposition:  HOME/SELF CARE  Per UR Regulation:  Reviewed for med. necessity/level of care/duration of stay  If discussed at [place] of Stay Meetings, dates discussed:    Comments:  02-12-14 Sunday Spillers RN CM 1200 Spoke with patient and daughter at bedside. Patient has not HH needs at this time, she is worried about who will assist with her husbands care, daughter assured her that they would arrange needed care for him. Patient encouraged to focus on own recovery.  02-05-14 Sunday Spillers RN CM 1000 Spoke with patient and daughter at bedside per request of the daughter. They were requesting information on assistance at home. Discussed skilled vs custodial services, insurance coverage for skilled but not for custodial. Patient has a longterm care policy that may cover non skilled services. Daughter will look at policy, is interested in [facility] for care. Daughter will contact [facility] and see if policy covers their services. Patient is primary caregiver for husband who has mild dementia and is in poor health. She is  concerned about his care, patient's son is caring for him now. Encouraged patient to focus on self recovery and let other family members take care of husband. Patient appreciative of information. Will continue to follow for d/c needs, reassess once NG d/ced and patient mobilizes.

## 2014-02-05 NOTE — Progress Notes (Signed)
1 Day Post-Op Subjective: Patient reports surgical pain, abdominalpain. Minimal JP drainage, Foley clear urine. NG irritation.   Objective: Vital signs in last 24 hours: Temp:  [97.5 F (36.4 C)-98.8 F (37.1 C)] 98.8 F (37.1 C) (01/27 0618) Pulse Rate:  [67-97] 67 (01/27 0618) Resp:  [13-17] 16 (01/27 0618) BP: (135-181)/(59-96) 141/62 mmHg (01/27 0618) SpO2:  [96 %-100 %] 98 % (01/27 0618)  Intake/Output from previous day: 01/26 0701 - 01/27 0700 In: 6160 [P.O.:60; I.V.:6050; IV Piggyback:50] Out: 2870 [Urine:2360; Drains:260; Blood:250] Intake/Output this shift:  JP: 260 cc.   Physical Exam:  General:alert, cooperative, appears stated age and no distress GI: soft, involuntary guarding and tenderness: suprapubic. NG draining clear mucous. No BS.  Female genitalia: not done not indicated Extremities: extremities normal, atraumatic, no cyanosis or edema  Lab Results:  Recent Labs  02/03/14 1910 02/04/14 1540 02/05/14 0645  HGB 12.8 11.6* 10.5*  HCT 39.0 35.7* 32.0*   BMET  Recent Labs  02/03/14 1910 02/04/14 1540 02/05/14 0537  NA 142  --  137  K 4.2  --  4.2  CL 104  --  104  CO2 21  --  23  GLUCOSE 135*  --  168*  BUN 22  --  11  CREATININE 0.93 0.83 0.65  CALCIUM 10.0  --  8.4   No results for input(s): LABPT, INR in the last 72 hours. No results for input(s): LABURIN in the last 72 hours. Results for orders placed or performed during the hospital encounter of 11/06/13  Urine culture     Status: None   Collection Time: 11/06/13  9:45 AM  Result Value Ref Range Status   Specimen Description URINE, CATHETERIZED  Final   Special Requests NONE  Final   Culture  Setup Time   Final    11/06/2013 18:46 Performed at [facility]   Final    10,000 COLONIES/ML Performed at [facility]   Culture   Final    Multiple bacterial morphotypes present, none predominant. Suggest appropriate recollection if clinically  indicated. Performed at [facility]   Report Status 11/07/2013 FINAL  Final    Studies/Results: Dg Abd Portable 1v  02/04/2014   CLINICAL DATA:  Incorrect instrument count.  EXAM: PORTABLE ABDOMEN - 1 VIEW  COMPARISON:  None.  FINDINGS: The bowel gas pattern is normal. Nasogastric tube tip is seen in expected position of distal stomach. Surgical staples seen in left upper quadrant of the abdomen. Midline surgical staples are seen in the pelvis. Surgical drain is also seen in the pelvis. No other radiopaque foreign body is noted.  IMPRESSION: No other radiopaque foreign body seen other than surgical staples.   Electronically Signed   By: [name] M.D.   On: 02/04/2014 12:32    Assessment/Plan: Mrs Cregan appears Urologically stable POD 1.  Will continue to follow.  Plan to leave foley for 10 days prior to cystogram.    LOS: 1 day   Dalylah Ramey I 02/05/2014, 9:21 AM

## 2014-02-05 NOTE — Progress Notes (Signed)
RE: NGT/nausea.  NGT clamped for 1 hour when pt received po meds this am.  Pt reported nausea and given Zofran. Daughter said pt "always get sick when she takes her medicine on an empty stomach."  Received orders to d/c NGT.  Discussed with pt/daughter possibility that nausea due to NGT being clamped and pt/daughter in agreement with this plan:  NGT was reconnected for 1 hour.  Then pt ambulated in halls and NGT left clamped.  If no nausea this afternoon, will plan to remove NGT.  If nausea persists, will notify MD before removing NGT.

## 2014-02-06 MED ORDER — METOPROLOL TARTRATE 1 MG/ML IV SOLN
5.0000 mg | Freq: Four times a day (QID) | INTRAVENOUS | Status: DC | PRN
  Administered 2014-02-06 – 2014-02-08 (×3): 5 mg via INTRAVENOUS
  Filled 2014-02-06 (×4): qty 5

## 2014-02-06 NOTE — Progress Notes (Signed)
Patient ID: [name], female   DOB: 1940/03/20, 74 y.o.   MRN: [id number] [facility] Progress Note:   2 Days Post-Op  Subjective: Mental status is clear Objective: Vital signs in last 24 hours: Temp:  [98.2 F (36.8 C)-99.1 F (37.3 C)] 99.1 F (37.3 C) (01/28 0537) Pulse Rate:  [57-72] 57 (01/28 0537) Resp:  [14-16] 16 (01/28 0537) BP: (134-147)/(54-66) 138/62 mmHg (01/28 0537) SpO2:  [95 %-99 %] 96 % (01/28 0537)  Intake/Output from previous day: 01/27 0701 - 01/28 0700 In: 2856.7 [P.O.:60; I.V.:2796.7] Out: 3340 [Urine:3200; Emesis/NG output:75; Drains:65] Intake/Output this shift:    Physical Exam: Work of breathing is normal.  Incisons oK.  JP serosanguinous.  Foley in place  Lab Results:  Results for orders placed or performed during the hospital encounter of 02/04/14 (from the past 48 hour(s))  CBC     Status: Abnormal   Collection Time: 02/04/14  3:40 PM  Result Value Ref Range   WBC 12.2 (H) 4.0 - 10.5 K/uL   RBC 3.90 3.87 - 5.11 MIL/uL   Hemoglobin 11.6 (L) 12.0 - 15.0 g/dL   HCT 35.7 (L) 36.0 - 46.0 %   MCV 91.5 78.0 - 100.0 fL   MCH 29.7 26.0 - 34.0 pg   MCHC 32.5 30.0 - 36.0 g/dL   RDW 14.4 11.5 - 15.5 %   Platelets 283 150 - 400 K/uL  Creatinine, serum     Status: Abnormal   Collection Time: 02/04/14  3:40 PM  Result Value Ref Range   Creatinine, Ser 0.83 0.50 - 1.10 mg/dL   GFR calc non Af Amer 67 (L) >90 mL/min   GFR calc Af Amer 78 (L) >90 mL/min    Comment: (NOTE) The eGFR has been calculated using the CKD EPI equation. This calculation has not been validated in all clinical situations. eGFR's persistently <90 mL/min signify possible Chronic Kidney Disease.   Basic metabolic panel     Status: Abnormal   Collection Time: 02/05/14  5:37 AM  Result Value Ref Range   Sodium 137 135 - 145 mmol/L   Potassium 4.2 3.5 - 5.1 mmol/L   Chloride 104 96 - 112 mmol/L   CO2 23 19 - 32 mmol/L   Glucose, Bld 168 (H) 70 - 99 mg/dL   BUN 11 6 -  23 mg/dL    Comment: DELTA CHECK NOTED REPEATED TO VERIFY    Creatinine, Ser 0.65 0.50 - 1.10 mg/dL   Calcium 8.4 8.4 - 10.5 mg/dL   GFR calc non Af Amer 85 (L) >90 mL/min   GFR calc Af Amer >90 >90 mL/min    Comment: (NOTE) The eGFR has been calculated using the CKD EPI equation. This calculation has not been validated in all clinical situations. eGFR's persistently <90 mL/min signify possible Chronic Kidney Disease.    Anion gap 10 5 - 15  CBC     Status: Abnormal   Collection Time: 02/05/14  6:45 AM  Result Value Ref Range   WBC 11.4 (H) 4.0 - 10.5 K/uL   RBC 3.53 (L) 3.87 - 5.11 MIL/uL   Hemoglobin 10.5 (L) 12.0 - 15.0 g/dL   HCT 32.0 (L) 36.0 - 46.0 %   MCV 90.7 78.0 - 100.0 fL   MCH 29.7 26.0 - 34.0 pg   MCHC 32.8 30.0 - 36.0 g/dL   RDW 14.6 11.5 - 15.5 %   Platelets 254 150 - 400 K/uL    Radiology/Results: Dg Abd Portable 1v  02/04/2014   CLINICAL DATA:  Incorrect instrument count.  EXAM: PORTABLE ABDOMEN - 1 VIEW  COMPARISON:  None.  FINDINGS: The bowel gas pattern is normal. Nasogastric tube tip is seen in expected position of distal stomach. Surgical staples seen in left upper quadrant of the abdomen. Midline surgical staples are seen in the pelvis. Surgical drain is also seen in the pelvis. No other radiopaque foreign body is noted.  IMPRESSION: No other radiopaque foreign body seen other than surgical staples.   Electronically Signed   By: [name] M.D.   On: 02/04/2014 12:32    Anti-infectives: Anti-infectives    Start     Dose/Rate Route Frequency Ordered Stop   02/04/14 1800  cefoTEtan (CEFOTAN) 2 g in dextrose 5 % 50 mL IVPB     2 g100 mL/hr over 30 Minutes Intravenous Every 12 hours 02/04/14 1450 02/04/14 1829   02/04/14 0600  cefoTEtan (CEFOTAN) 2 g in dextrose 5 % 50 mL IVPB     2 g100 mL/hr over 30 Minutes Intravenous On call to O.R. 02/04/14 0553 02/04/14 0730      Assessment/Plan: Problem List: Patient Active Problem List   Diagnosis Date Noted   . 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 Uterine polyp 08/06/2013  . Unspecified constipation 08/05/2013  . Thickened endometrium incidentally noted on CT 08/05/2013  . Diverticulitis of large intestine with abscess without bleeding 08/04/2013  . HTN (hypertension) 08/04/2013  . Ovarian cystic mass 02/01/2011  . Tubo-ovarian abscess 02/01/2011  . Hyponatremia 02/01/2011  . Hypokalemia 02/01/2011  . Barrett's esophagus 12/17/2008  . DIVERTICULITIS OF COLON 12/17/2008  . NONSPECIFIC ABN FINDING RAD & OTH EXAM GI TRACT 12/17/2008    Stable postop 2 Days Post-Op    LOS: 2 days   Matt B. Hassell Done, MD, [facility] [phone] beeper [phone]  02/06/2014 7:21 AM

## 2014-02-06 NOTE — Progress Notes (Signed)
2 Days Post-Op Subjective: Patient reports walking, and using IS. Minimal JP output. Foley clear urine.   Objective: Vital signs in last 24 hours: Temp:  [98.1 F (36.7 C)-99.1 F (37.3 C)] 98.1 F (36.7 C) (01/28 1034) Pulse Rate:  [57-72] 60 (01/28 1034) Resp:  [14-16] 16 (01/28 1034) BP: (134-147)/(52-66) 138/52 mmHg (01/28 1034) SpO2:  [95 %-99 %] 95 % (01/28 1034)  Intake/Output from previous day: 01/27 0701 - 01/28 0700 In: 2856.7 [P.O.:60; Velazquez.V.:2796.7] Out: 3340 [Urine:3200; Emesis/NG output:75; Drains:65] Intake/Output this shift:    Physical Exam:  General:alert and cooperative GI: not done and soft,  tender, no bowel sounds, no palpable masses, no organomegaly, no inguinal hernia Female genitalia: not done normal external genitalia, vulva, vagina, cervix, uterus and adnexa   Lab Results:  Recent Labs  02/03/14 1910 02/04/14 1540 02/05/14 0645  HGB 12.8 11.6* 10.5*  HCT 39.0 35.7* 32.0*   BMET  Recent Labs  02/03/14 1910 02/04/14 1540 02/05/14 0537  NA 142  --  137  K 4.2  --  4.2  CL 104  --  104  CO2 21  --  23  GLUCOSE 135*  --  168*  BUN 22  --  11  CREATININE 0.93 0.83 0.65  CALCIUM 10.0  --  8.4   No results for input(s): LABPT, INR in the last 72 hours. No results for input(s): LABURIN in the last 72 hours. Results for orders placed or performed during the hospital encounter of 11/06/13  Urine culture     Status: None   Collection Time: 11/06/13  9:45 AM  Result Value Ref Range Status   Specimen Description URINE, CATHETERIZED  Final   Special Requests NONE  Final   Culture  Setup Time   Final    11/06/2013 18:46 Performed at [place]   Final    10,000 COLONIES/ML Performed at [facility]   Culture   Final    Multiple bacterial morphotypes present, none predominant. Suggest appropriate recollection if clinically indicated. Performed at [facility]   Report Status 11/07/2013 FINAL   Final  JP down to 65cc yesterday.   Studies/Results: Dg Abd Portable 1v  02/04/2014   CLINICAL DATA:  Incorrect instrument count.  EXAM: PORTABLE ABDOMEN - 1 VIEW  COMPARISON:  None.  FINDINGS: The bowel gas pattern is normal. Nasogastric tube tip is seen in expected position of distal stomach. Surgical staples seen in left upper quadrant of the abdomen. Midline surgical staples are seen in the pelvis. Surgical drain is also seen in the pelvis. No other radiopaque foreign body is noted.  IMPRESSION: No other radiopaque foreign body seen other than surgical staples.   Electronically Signed   By: [name] M.D.   On: 02/04/2014 12:32    Assessment/Plan: Pt continuing to improve.  P: leave foley in place after discharge. Will plan office cystogram and foley removal next week.    LOS: 2 days   [name] 02/06/2014, 10:35 AM

## 2014-02-07 MED ORDER — PNEUMOCOCCAL VAC POLYVALENT 25 MCG/0.5ML IJ INJ
0.5000 mL | INJECTION | INTRAMUSCULAR | Status: DC
  Filled 2014-02-07 (×2): qty 0.5

## 2014-02-07 NOTE — Progress Notes (Signed)
3 Days Post-Op Subjective: Patient reports+ flatus. NG out. Feels much improved. JP minimal op. ( 20cc last shift)  Objective: Vital signs in last 24 hours: Temp:  [97.8 F (36.6 C)-98.7 F (37.1 C)] 98.4 F (36.9 C) (01/29 1655) Pulse Rate:  [60-73] 71 (01/29 1655) Resp:  [16-18] 16 (01/29 1655) BP: (121-145)/(47-60) 134/57 mmHg (01/29 1655) SpO2:  [95 %-98 %] 95 % (01/29 1655)  Intake/Output from previous day: 01/28 0701 - 01/29 0700 In: 2403.3 [I.V.:2403.3] Out: 2765 [Urine:2725; Drains:40] Intake/Output this shift: Total I/O In: 1280 [P.O.:480; I.V.:800] Out: 1220 [Urine:1200; Drains:20]  Physical Exam:  General:WDWNF in NAD GI: tender,. But improving.   Lab Results:  Recent Labs  02/05/14 0645  HGB 10.5*  HCT 32.0*   BMET  Recent Labs  02/05/14 0537  NA 137  K 4.2  CL 104  CO2 23  GLUCOSE 168*  BUN 11  CREATININE 0.65  CALCIUM 8.4   No results for input(s): LABPT, INR in the last 72 hours. No results for input(s): LABURIN in the last 72 hours. Results for orders placed or performed during the hospital encounter of 11/06/13  Urine culture     Status: None   Collection Time: 11/06/13  9:45 AM  Result Value Ref Range Status   Specimen Description URINE, CATHETERIZED  Final   Special Requests NONE  Final   Culture  Setup Time   Final    11/06/2013 18:46 Performed at [place]   Final    10,000 COLONIES/ML Performed at [facility]   Culture   Final    Multiple bacterial morphotypes present, none predominant. Suggest appropriate recollection if clinically indicated. Performed at [facility]   Report Status 11/07/2013 FINAL  Final    Studies/Results:  Assessment/Plan:  Stable today. + Flatus. NG out. Minimal JP drainage. Currently on clear liquids.  P; leave foley catheter.   LOS: 3 days   Trinna Kunst I 02/07/2014, 5:55 PM

## 2014-02-07 NOTE — Progress Notes (Signed)
Patient ID: [name], female   DOB: [birth year], 74 y.o.   MRN: [id number] [facility] Progress Note:   3 Days Post-Op  Subjective: Mental status is clear.  Passed flatus Objective: Vital signs in last 24 hours: Temp:  [97.9 F (36.6 C)-98.7 F (37.1 C)] 97.9 F (36.6 C) (01/29 0500) Pulse Rate:  [58-63] 61 (01/29 0500) Resp:  [16-18] 16 (01/29 0500) BP: (134-145)/(52-60) 145/56 mmHg (01/29 0500) SpO2:  [95 %-96 %] 95 % (01/29 0500)  Intake/Output from previous day: 01/28 0701 - 01/29 0700 In: 2403.3 [I.V.:2403.3] Out: 2765 [Urine:2725; Drains:40] Intake/Output this shift:    Physical Exam: Work of breathing is normal.  JP serosanguinous.  Minimal pain  Lab Results:  No results found for this or any previous visit (from the past 48 hour(s)).  Radiology/Results: No results found.  Anti-infectives: Anti-infectives    Start     Dose/Rate Route Frequency Ordered Stop   02/04/14 1800  cefoTEtan (CEFOTAN) 2 g in dextrose 5 % 50 mL IVPB     2 g100 mL/hr over 30 Minutes Intravenous Every 12 hours 02/04/14 1450 02/04/14 1829   02/04/14 0600  cefoTEtan (CEFOTAN) 2 g in dextrose 5 % 50 mL IVPB     2 g100 mL/hr over 30 Minutes Intravenous On call to O.R. 02/04/14 0553 02/04/14 0730      Assessment/Plan: Problem List: Patient Active Problem List   Diagnosis Date Noted  . 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 Uterine polyp 08/06/2013  . Unspecified constipation 08/05/2013  . Thickened endometrium incidentally noted on CT 08/05/2013  . Diverticulitis of large intestine with abscess without bleeding 08/04/2013  . HTN (hypertension) 08/04/2013  . Ovarian cystic mass 02/01/2011  . Tubo-ovarian abscess 02/01/2011  . Hyponatremia 02/01/2011  . Hypokalemia 02/01/2011  . Barrett's esophagus 12/17/2008  . DIVERTICULITIS OF COLON 12/17/2008  . NONSPECIFIC ABN FINDING RAD & OTH EXAM GI TRACT 12/17/2008    Doing well.  Start clear  liquids PO 3 Days Post-Op    LOS: 3 days   Matt B. Hassell Done, MD, [facility] [phone] beeper [phone]  02/07/2014 8:27 AM

## 2014-02-07 NOTE — Progress Notes (Signed)
Pt and her son asked why she  was not receiving scheduled IV Lopressor since she could not tolerate pills on her empty stomach since she is NPO. I told them that she was receiving prn Lopressor, and scheduled po Norvasc and Toprol XL. They both reiterated that she could not take po's, and thought the order was changed to scheduled IV Lopressor for her bp. I suggested that they discuss the matter with Dr. Hassell Done in the am.

## 2014-02-08 LAB — CBC WITH DIFFERENTIAL/PLATELET
BASOS ABS: 0 10*3/uL (ref 0.0–0.1)
Basophils Relative: 0 % (ref 0–1)
Eosinophils Absolute: 0.3 10*3/uL (ref 0.0–0.7)
Eosinophils Relative: 6 % — ABNORMAL HIGH (ref 0–5)
HCT: 31.7 % — ABNORMAL LOW (ref 36.0–46.0)
HEMOGLOBIN: 10.4 g/dL — AB (ref 12.0–15.0)
Lymphocytes Relative: 22 % (ref 12–46)
Lymphs Abs: 1.1 10*3/uL (ref 0.7–4.0)
MCH: 29.8 pg (ref 26.0–34.0)
MCHC: 32.8 g/dL (ref 30.0–36.0)
MCV: 90.8 fL (ref 78.0–100.0)
Monocytes Absolute: 0.5 10*3/uL (ref 0.1–1.0)
Monocytes Relative: 10 % (ref 3–12)
NEUTROS ABS: 3.1 10*3/uL (ref 1.7–7.7)
Neutrophils Relative %: 62 % (ref 43–77)
PLATELETS: 258 10*3/uL (ref 150–400)
RBC: 3.49 MIL/uL — AB (ref 3.87–5.11)
RDW: 13.9 % (ref 11.5–15.5)
WBC: 4.9 10*3/uL (ref 4.0–10.5)

## 2014-02-08 MED ORDER — PNEUMOCOCCAL VAC POLYVALENT 25 MCG/0.5ML IJ INJ
0.5000 mL | INJECTION | INTRAMUSCULAR | Status: DC
Start: 2014-02-09 — End: 2014-02-13
  Filled 2014-02-08 (×2): qty 0.5

## 2014-02-08 MED ORDER — METOPROLOL TARTRATE 1 MG/ML IV SOLN
5.0000 mg | Freq: Four times a day (QID) | INTRAVENOUS | Status: DC
  Administered 2014-02-08 – 2014-02-13 (×18): 5 mg via INTRAVENOUS
  Filled 2014-02-08 (×24): qty 5

## 2014-02-08 NOTE — Progress Notes (Signed)
Patient ID: [name], female   DOB: [birth year], 74 y.o.   MRN: [id number] 4 Days Post-Op  Subjective: Abdomen is "sore" but no severe pain. She did have some burning generalized discomfort after drinking clear liquids yesterday. Has been passing flatus and feels her abdomen is softer and less distended than yesterday.  Objective: Vital signs in last 24 hours: Temp:  [97.8 F (36.6 C)-98.4 F (36.9 C)] 98.1 F (36.7 C) (01/30 0535) Pulse Rate:  [61-73] 61 (01/30 0535) Resp:  [16-18] 18 (01/30 0535) BP: (121-171)/(47-63) 171/63 mmHg (01/30 0535) SpO2:  [95 %-98 %] 96 % (01/30 0535) Last BM Date: 02/04/14  Intake/Output from previous day: 01/29 0701 - 01/30 0700 In: 2323 [P.O.:480; I.V.:1843] Out: 3265 [Urine:3200; Drains:65] Intake/Output this shift:    General appearance: alert, cooperative and no distress GI: mild diffuse tenderness to very light touch. Incision/Wound: clean and dry without evidence of infection  Lab Results:   Recent Labs  02/08/14 0500  WBC 4.9  HGB 10.4*  HCT 31.7*  PLT 258   BMET No results for input(s): NA, K, CL, CO2, GLUCOSE, BUN, CREATININE, CALCIUM in the last 72 hours.   Studies/Results: No results found.  Anti-infectives: Anti-infectives    Start     Dose/Rate Route Frequency Ordered Stop   02/04/14 1800  cefoTEtan (CEFOTAN) 2 g in dextrose 5 % 50 mL IVPB     2 g100 mL/hr over 30 Minutes Intravenous Every 12 hours 02/04/14 1450 02/04/14 1829   02/04/14 0600  cefoTEtan (CEFOTAN) 2 g in dextrose 5 % 50 mL IVPB     2 g100 mL/hr over 30 Minutes Intravenous On call to O.R. 02/04/14 0553 02/04/14 0730      Assessment/Plan: s/p Procedure(s): LAPAROSCOPIC PARTIAL COLECTOMY TAKE DOWN OF COLOVESICAL FISTULA COLOVESICAL FISTULA REPAIR  Doing well without apparent complication. Continue clear liquids today. Ambulation encouraged.   LOS: 4 days    Jaeden Messer T 02/08/2014

## 2014-02-08 NOTE — Plan of Care (Signed)
Problem: Phase II Progression Outcomes Goal: Return of bowel function (flatus, BM) IF ABDOMINAL SURGERY:  Outcome: Progressing + flatus; moved a little mucous/bloody clots

## 2014-02-08 NOTE — Progress Notes (Signed)
Re:  BP meds.  Advised Dr. Excell Seltzer that pt has been refusing po BP meds (24 hr Metoprolol and qd amlodipine) due to premorbid problem with taking any po meds in the absence of a full stomach (causing nausea and distress).  Pt had been given PRN Metoprolol by night shift due to SBP in 180s.  Previously, pt had been receiving q6 IV Metoprolol.  Dr. Excell Seltzer d/c po metoprolol and re-ordered scheduled IV metoprolol.  Pt still refusing po amlodipine.

## 2014-02-09 LAB — GLUCOSE, CAPILLARY
GLUCOSE-CAPILLARY: 127 mg/dL — AB (ref 70–99)
Glucose-Capillary: 132 mg/dL — ABNORMAL HIGH (ref 70–99)

## 2014-02-09 NOTE — Plan of Care (Signed)
Problem: Phase II Progression Outcomes Goal: Tolerating diet Outcome: Not Progressing Poor po (CL diet) intake. Pt c/o intermittent nausea which resolves with Zofran.

## 2014-02-09 NOTE — Progress Notes (Signed)
Patient ID: [name], female   DOB: May 05, 1940, 74 y.o.   MRN: [id number] 5 Days Post-Op  Subjective: Feels about the same. Has been walking in the hall. Occasional nausea. Has had some flatus and a couple of episodes of a small amount of blood per rectum. Some occasional burning pain in her abdomen when moving that is about the same.  Objective: Vital signs in last 24 hours: Temp:  [98.2 F (36.8 C)-99 F (37.2 C)] 99 F (37.2 C) (01/31 0549) Pulse Rate:  [64-78] 78 (01/31 0549) Resp:  [16-18] 16 (01/31 0549) BP: (147-181)/(60-115) 168/80 mmHg (01/31 0549) SpO2:  [96 %-99 %] 96 % (01/31 0549) Last BM Date: 02/04/14  Intake/Output from previous day: 01/30 0701 - 01/31 0700 In: 2640 [P.O.:240; I.V.:2400] Out: 2680 [Urine:2650; Drains:30] Intake/Output this shift: Total I/O In: 240 [P.O.:240] Out: 265 [Urine:250; Drains:15]  General appearance: alert, cooperative and no distress GI: mild diffuse tenderness without guarding. Nondistended. A few bowel sounds. Incision/Wound: no erythema or unusual drainage  Lab Results:   Recent Labs  02/08/14 0500  WBC 4.9  HGB 10.4*  HCT 31.7*  PLT 258   BMET No results for input(s): NA, K, CL, CO2, GLUCOSE, BUN, CREATININE, CALCIUM in the last 72 hours.   Studies/Results: No results found.  Anti-infectives: Anti-infectives    Start     Dose/Rate Route Frequency Ordered Stop   02/04/14 1800  cefoTEtan (CEFOTAN) 2 g in dextrose 5 % 50 mL IVPB     2 g100 mL/hr over 30 Minutes Intravenous Every 12 hours 02/04/14 1450 02/04/14 1829   02/04/14 0600  cefoTEtan (CEFOTAN) 2 g in dextrose 5 % 50 mL IVPB     2 g100 mL/hr over 30 Minutes Intravenous On call to O.R. 02/04/14 0553 02/04/14 0730      Assessment/Plan: s/p Procedure(s): LAPAROSCOPIC PARTIAL COLECTOMY TAKE DOWN OF COLOVESICAL FISTULA COLOVESICAL FISTULA REPAIR  Still appears to have some degree of ileus. Continue clear liquids only. Small amount of rectal bleeding. No  evidence of ongoing bleeding. Check labs in a.m.   LOS: 5 days    Jeury Mcnab T 02/09/2014

## 2014-02-10 LAB — BASIC METABOLIC PANEL
ANION GAP: 6 (ref 5–15)
BUN: 7 mg/dL (ref 6–23)
CHLORIDE: 107 mmol/L (ref 96–112)
CO2: 24 mmol/L (ref 19–32)
Calcium: 8.4 mg/dL (ref 8.4–10.5)
Creatinine, Ser: 0.66 mg/dL (ref 0.50–1.10)
GFR, EST NON AFRICAN AMERICAN: 84 mL/min — AB (ref >90)
Glucose, Bld: 113 mg/dL — ABNORMAL HIGH (ref 70–99)
Potassium: 3.7 mmol/L (ref 3.5–5.1)
Sodium: 137 mmol/L (ref 135–145)

## 2014-02-10 LAB — CBC
HCT: 31.6 % — ABNORMAL LOW (ref 36.0–46.0)
HEMOGLOBIN: 10.3 g/dL — AB (ref 12.0–15.0)
MCH: 29.8 pg (ref 26.0–34.0)
MCHC: 32.6 g/dL (ref 30.0–36.0)
MCV: 91.3 fL (ref 78.0–100.0)
PLATELETS: 284 10*3/uL (ref 150–400)
RBC: 3.46 MIL/uL — ABNORMAL LOW (ref 3.87–5.11)
RDW: 14.6 % (ref 11.5–15.5)
WBC: 5 10*3/uL (ref 4.0–10.5)

## 2014-02-10 MED ORDER — HYDRALAZINE HCL 20 MG/ML IJ SOLN
20.0000 mg | INTRAMUSCULAR | Status: DC | PRN
Start: 2014-02-10 — End: 2014-02-13
  Administered 2014-02-10 – 2014-02-13 (×2): 20 mg via INTRAVENOUS
  Filled 2014-02-10 (×2): qty 1

## 2014-02-10 MED ORDER — CETYLPYRIDINIUM CHLORIDE 0.05 % MT LIQD: 7.0000 mL | Freq: Two times a day (BID) | OROMUCOSAL | Status: DC

## 2014-02-10 MED ORDER — CHLORHEXIDINE GLUCONATE 0.12 % MT SOLN
15.0000 mL | Freq: Two times a day (BID) | OROMUCOSAL | Status: DC
  Administered 2014-02-10: 15 mL via OROMUCOSAL
  Filled 2014-02-10: qty 15

## 2014-02-10 NOTE — Progress Notes (Signed)
Patient ID: [name], female   DOB: 1940-12-04, 74 y.o.   MRN: [id number] [facility] Progress Note:   6 Days Post-Op  Subjective: Mental status is clear.  Feeling good Objective: Vital signs in last 24 hours: Temp:  [97.9 F (36.6 C)-98.3 F (36.8 C)] 97.9 F (36.6 C) (02/01 0900) Pulse Rate:  [64-82] 70 (02/01 0900) Resp:  [16-18] 16 (02/01 0900) BP: (136-161)/(54-72) 143/63 mmHg (02/01 0900) SpO2:  [96 %-99 %] 96 % (02/01 0900)  Intake/Output from previous day: 01/31 0701 - 02/01 0700 In: 3000 [P.O.:600; I.V.:2400] Out: 1965 [UTMLY:6503; Drains:15] Intake/Output this shift: Total I/O In: 120 [P.O.:120] Out: 605 [Urine:575; Drains:30]  Physical Exam: Work of breathing is normal  Lab Results:  Results for orders placed or performed during the hospital encounter of 02/04/14 (from the past 48 hour(s))  Glucose, capillary     Status: Abnormal   Collection Time: 02/09/14  7:41 AM  Result Value Ref Range   Glucose-Capillary 127 (H) 70 - 99 mg/dL  Glucose, capillary     Status: Abnormal   Collection Time: 02/09/14 11:57 AM  Result Value Ref Range   Glucose-Capillary 132 (H) 70 - 99 mg/dL  CBC     Status: Abnormal   Collection Time: 02/10/14  5:30 AM  Result Value Ref Range   WBC 5.0 4.0 - 10.5 K/uL   RBC 3.46 (L) 3.87 - 5.11 MIL/uL   Hemoglobin 10.3 (L) 12.0 - 15.0 g/dL   HCT 31.6 (L) 36.0 - 46.0 %   MCV 91.3 78.0 - 100.0 fL   MCH 29.8 26.0 - 34.0 pg   MCHC 32.6 30.0 - 36.0 g/dL   RDW 14.6 11.5 - 15.5 %   Platelets 284 150 - 400 K/uL  Basic metabolic panel     Status: Abnormal   Collection Time: 02/10/14  5:30 AM  Result Value Ref Range   Sodium 137 135 - 145 mmol/L   Potassium 3.7 3.5 - 5.1 mmol/L   Chloride 107 96 - 112 mmol/L   CO2 24 19 - 32 mmol/L   Glucose, Bld 113 (H) 70 - 99 mg/dL   BUN 7 6 - 23 mg/dL   Creatinine, Ser 0.66 0.50 - 1.10 mg/dL   Calcium 8.4 8.4 - 10.5 mg/dL   GFR calc non Af Amer 84 (L) >90 mL/min   GFR calc Af Amer >90 >90  mL/min    Comment: (NOTE) The eGFR has been calculated using the CKD EPI equation. This calculation has not been validated in all clinical situations. eGFR's persistently <90 mL/min signify possible Chronic Kidney Disease.    Anion gap 6 5 - 15    Radiology/Results: No results found.  Anti-infectives: Anti-infectives    Start     Dose/Rate Route Frequency Ordered Stop   02/04/14 1800  cefoTEtan (CEFOTAN) 2 g in dextrose 5 % 50 mL IVPB     2 g100 mL/hr over 30 Minutes Intravenous Every 12 hours 02/04/14 1450 02/04/14 1829   02/04/14 0600  cefoTEtan (CEFOTAN) 2 g in dextrose 5 % 50 mL IVPB     2 g100 mL/hr over 30 Minutes Intravenous On call to O.R. 02/04/14 0553 02/04/14 0730      Assessment/Plan: Problem List: Patient Active Problem List   Diagnosis Date Noted  . 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 Uterine polyp 08/06/2013  . Unspecified constipation 08/05/2013  . Thickened endometrium incidentally noted on CT 08/05/2013  . Diverticulitis of large  intestine with abscess without bleeding 08/04/2013  . HTN (hypertension) 08/04/2013  . Ovarian cystic mass 02/01/2011  . Tubo-ovarian abscess 02/01/2011  . Hyponatremia 02/01/2011  . Hypokalemia 02/01/2011  . Barrett's esophagus 12/17/2008  . DIVERTICULITIS OF COLON 12/17/2008  . NONSPECIFIC ABN FINDING RAD & OTH EXAM GI TRACT 12/17/2008    JP serous.  Having watery BMs as expected.  Will advance to full liquids.   6 Days Post-Op    LOS: 6 days   Matt B. Hassell Done, MD, [facility] [phone] beeper [phone]  02/10/2014 1:26 PM

## 2014-02-11 MED ORDER — ENSURE COMPLETE PO LIQD
237.0000 mL | Freq: Two times a day (BID) | ORAL | Status: DC
  Administered 2014-02-11 – 2014-02-13 (×4): 237 mL via ORAL

## 2014-02-11 NOTE — Progress Notes (Signed)
7 Days Post-Op Subjective: Patient reportsdifficulty with BP meds and diet ( gluten free, and on liquid diet). Urine clear.  Objective: Vital signs in last 24 hours: Temp:  [97.8 F (36.6 C)-98.3 F (36.8 C)] 97.9 F (36.6 C) (02/02 0545) Pulse Rate:  [63-122] 80 (02/02 0545) Resp:  [16-18] 16 (02/02 0545) BP: (139-181)/(49-70) 172/65 mmHg (02/02 0545) SpO2:  [96 %-100 %] 99 % (02/02 0545)  Intake/Output from previous day: 02/01 0701 - 02/02 0700 In: 1009.2 [P.O.:240; I.V.:769.2] Out: 1530 [Urine:1475; Drains:55] Intake/Output this shift:    Physical Exam:  General:alert and cooperative GI: not done and soft, non tender, normal bowel sounds, no palpable masses, no organomegaly, no inguinal hernia GU: normal BUS. S-p flat.    Lab Results:  Recent Labs  02/10/14 0530  HGB 10.3*  HCT 31.6*   BMET  Recent Labs  02/10/14 0530  NA 137  K 3.7  CL 107  CO2 24  GLUCOSE 113*  BUN 7  CREATININE 0.66  CALCIUM 8.4   No results for input(s): LABPT, INR in the last 72 hours. No results for input(s): LABURIN in the last 72 hours. Results for orders placed or performed during the hospital encounter of 11/06/13  Urine culture     Status: None   Collection Time: 11/06/13  9:45 AM  Result Value Ref Range Status   Specimen Description URINE, CATHETERIZED  Final   Special Requests NONE  Final   Culture  Setup Time   Final    11/06/2013 18:46 Performed at [place]   Final    10,000 COLONIES/ML Performed at [facility]   Culture   Final    Multiple bacterial morphotypes present, none predominant. Suggest appropriate recollection if clinically indicated. Performed at [facility]   Report Status 11/07/2013 FINAL  Final    Studies/Results: No results found.  Assessment/Plan:  Urologically stable. Pt needs nursing/phjarmacy/nutritional consult for her Gluten intolerance and her post op status.    LOS: 7 days   Briley Bumgarner,  Kemoni Quesenberry I 02/11/2014, 8:43 AM

## 2014-02-11 NOTE — Progress Notes (Signed)
INITIAL NUTRITION ASSESSMENT  DOCUMENTATION CODES Per approved criteria  -Not Applicable   INTERVENTION: - Pt questions answered regarding a gluten-free diet. - Ensure Complete po BID, each supplement provides 350 kcal and 13 grams of protein - RD will continue to follow for nutrition care plan.  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   Goal: Pt to meet >/= 90% of their estimated nutrition needs   Monitor:  Weight trend, po intake, acceptance of supplements, labs  Reason for Assessment: RN request  74 y.o. female  Admitting Dx: <principal problem not specified>  ASSESSMENT: 74 y.o. 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 Has been having more pain and arrangements made for colectomy and takedown of colovesical fistula  - Pt with questions regarding foods she is able to eat that are easy to digest and gluten free. Pt has a history of Celiac disease, diagnosed in 2007. Pt aware of what she is able to eat and is good at reading food labels. - Pt advised that she is able to use Ensure or Boost at home to help add calories and protein to diet when her appetite is poor. Will order while pt in hospital. - Pt reports no recent weight loss. Her appetite is starting to improve. - No signs of fat or muscle wasting.  Labs reviewed  Height: Ht Readings from Last 1 Encounters:  02/04/14 5' (1.524 m)    Weight: Wt Readings from Last 1 Encounters:  02/04/14 129 lb (58.514 kg)    Ideal Body Weight: 45.5 kg  % Ideal Body Weight: 129%  Wt Readings from Last 10 Encounters:  02/04/14 129 lb (58.514 kg)  09/26/13 132 lb 6 oz (60.045 kg)  08/04/13 139 lb 5.3 oz (63.2 kg)  10/09/12 147 lb (66.679 kg)  09/25/12 147 lb 3.2 oz (66.769 kg)  03/03/11 136 lb 1.6 oz (61.735 kg)  02/01/11 140 lb 4 oz (63.617 kg)  12/17/08 147 lb 9.6 oz (66.951 kg)   BMI:  Body mass index is 25.19  kg/(m^2).  Estimated Nutritional Needs: Kcal: 1500-1700 Protein: 80-90 g Fluid: 1.7 L/day  Skin: closed incisions on perineum and abdomen  Diet Order: Diet gluten free  EDUCATION NEEDS: -Education needs addressed   Intake/Output Summary (Last 24 hours) at 02/11/14 1307 Last data filed at 02/11/14 1230  Gross per 24 hour  Intake 1149.17 ml  Output   1150 ml  Net  -0.83 ml    Last BM: 2/1   Labs:   Recent Labs Lab 02/04/14 1540 02/05/14 0537 02/10/14 0530  NA  --  137 137  K  --  4.2 3.7  CL  --  104 107  CO2  --  23 24  BUN  --  11 7  CREATININE 0.83 0.65 0.66  CALCIUM  --  8.4 8.4  GLUCOSE  --  168* 113*    CBG (last 3)   Recent Labs  02/09/14 0741 02/09/14 1157  GLUCAP 127* 132*    Scheduled Meds: . amLODipine  5 mg Oral Daily  . cycloSPORINE  1 drop Both Eyes BID  . heparin subcutaneous  5,000 Units Subcutaneous 3 times per day  . metoprolol  5 mg Intravenous 4 times per day  . pneumococcal 23 valent vaccine  0.5 mL Intramuscular Tomorrow-1000    Continuous Infusions: . dextrose 5 % and 0.45 % NaCl with KCl 20 mEq/L 50 mL/hr at 02/10/14 1437  Past Medical History  Diagnosis Date  . Hypertension   . Celiac disease     per Dr. Fuller Plan  . Diverticulitis     and diverticulosis in sigmoid colon   . GERD (gastroesophageal reflux disease)   . Erosive esophagitis   . Barrett's esophagus   . Hiatal hernia   . Blood in urine     has symptoms of UTI- burning and aching lower abdomen  . Heart murmur     DOES NOT CAUSE ANY PROBLEMS  . Degenerative joint disease     knees  . Colovesical fistula   . Rheumatic fever     as child  . Anemia     as child  . Complication of anesthesia 1966    adverse reaction to saddle block  . PONV (postoperative nausea and vomiting)     Past Surgical History  Procedure Laterality Date  . Appendectomy    . Carpal tunnel release Left   . Knee arthroscopy Right   . Bunionectomy Right   . Hysteroscopy w/d&c  N/A 11/06/2013    Procedure: DILATATION AND CURETTAGE /HYSTEROSCOPY;  Surgeon: Daria Pastures, MD;  Location: [facility];  Service: Gynecology;  Laterality: N/A;  . Cystoscopy w/ retrogrades Left 12/23/2013    Procedure: CYSTOSCOPY WITH LEFT RETROGRADE PYELOGRAM;  Surgeon: Ailene Rud, MD;  Location: [facility];  Service: Urology;  Laterality: Left;  . Transurethral resection of bladder tumor N/A 12/23/2013    Procedure: TRANSURETHRAL RESECTION OF BLADDER TUMOR (TURBT);  Surgeon: Ailene Rud, MD;  Location: [facility];  Service: Urology;  Laterality: N/A;  . Tonsillectomy      x2  . Laparoscopic partial colectomy N/A 02/04/2014    Procedure: LAPAROSCOPIC PARTIAL COLECTOMY;  Surgeon: Pedro Earls, MD;  Location: [facility];  Service: General;  Laterality: N/A;  . Take down of intestinal fistula N/A 02/04/2014    Procedure: TAKE DOWN OF COLOVESICAL FISTULA;  Surgeon: Pedro Earls, MD;  Location: [facility];  Service: General;  Laterality: N/A;  . Vesico-vaginal fistula repair N/A 02/04/2014    Procedure: COLOVESICAL FISTULA REPAIR ;  Surgeon: Ailene Rud, MD;  Location: [facility];  Service: Urology;  Laterality: N/A;    Laurette Schimke MS, RD, LDN

## 2014-02-11 NOTE — Progress Notes (Signed)
Patient ID: [name], female   DOB: October 08, 1940, 74 y.o.   MRN: [id number] [facility] Progress Note:   7 Days Post-Op  Subjective: Mental status is clear.  Needs solids to take her BP meds.  Will advance to gluten free Objective: Vital signs in last 24 hours: Temp:  [97.8 F (36.6 C)-98.3 F (36.8 C)] 97.9 F (36.6 C) (02/02 0545) Pulse Rate:  [63-122] 88 (02/02 1005) Resp:  [16-18] 16 (02/02 0545) BP: (139-185)/(49-70) 160/66 mmHg (02/02 1104) SpO2:  [97 %-100 %] 99 % (02/02 0545)  Intake/Output from previous day: 02/01 0701 - 02/02 0700 In: 1009.2 [P.O.:240; I.V.:769.2] Out: 1530 [FXOVA:9191; Drains:55] Intake/Output this shift: Total I/O In: 60 [P.O.:60] Out: 200 [Urine:200]  Physical Exam: Work of breathing is normal.  Incisions OK.  JP serous-to be removed  Lab Results:  Results for orders placed or performed during the hospital encounter of 02/04/14 (from the past 48 hour(s))  Glucose, capillary     Status: Abnormal   Collection Time: 02/09/14 11:57 AM  Result Value Ref Range   Glucose-Capillary 132 (H) 70 - 99 mg/dL  CBC     Status: Abnormal   Collection Time: 02/10/14  5:30 AM  Result Value Ref Range   WBC 5.0 4.0 - 10.5 K/uL   RBC 3.46 (L) 3.87 - 5.11 MIL/uL   Hemoglobin 10.3 (L) 12.0 - 15.0 g/dL   HCT 31.6 (L) 36.0 - 46.0 %   MCV 91.3 78.0 - 100.0 fL   MCH 29.8 26.0 - 34.0 pg   MCHC 32.6 30.0 - 36.0 g/dL   RDW 14.6 11.5 - 15.5 %   Platelets 284 150 - 400 K/uL  Basic metabolic panel     Status: Abnormal   Collection Time: 02/10/14  5:30 AM  Result Value Ref Range   Sodium 137 135 - 145 mmol/L   Potassium 3.7 3.5 - 5.1 mmol/L   Chloride 107 96 - 112 mmol/L   CO2 24 19 - 32 mmol/L   Glucose, Bld 113 (H) 70 - 99 mg/dL   BUN 7 6 - 23 mg/dL   Creatinine, Ser 0.66 0.50 - 1.10 mg/dL   Calcium 8.4 8.4 - 10.5 mg/dL   GFR calc non Af Amer 84 (L) >90 mL/min   GFR calc Af Amer >90 >90 mL/min    Comment: (NOTE) The eGFR has been calculated using the  CKD EPI equation. This calculation has not been validated in all clinical situations. eGFR's persistently <90 mL/min signify possible Chronic Kidney Disease.    Anion gap 6 5 - 15    Radiology/Results: No results found.  Anti-infectives: Anti-infectives    Start     Dose/Rate Route Frequency Ordered Stop   02/04/14 1800  cefoTEtan (CEFOTAN) 2 g in dextrose 5 % 50 mL IVPB     2 g100 mL/hr over 30 Minutes Intravenous Every 12 hours 02/04/14 1450 02/04/14 1829   02/04/14 0600  cefoTEtan (CEFOTAN) 2 g in dextrose 5 % 50 mL IVPB     2 g100 mL/hr over 30 Minutes Intravenous On call to O.R. 02/04/14 0553 02/04/14 0730      Assessment/Plan: Problem List: Patient Active Problem List   Diagnosis Date Noted  . 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 Uterine polyp 08/06/2013  . Unspecified constipation 08/05/2013  . Thickened endometrium incidentally noted on CT 08/05/2013  . Diverticulitis of large intestine with abscess without bleeding 08/04/2013  . HTN (hypertension) 08/04/2013  .  Ovarian cystic mass 02/01/2011  . Tubo-ovarian abscess 02/01/2011  . Hyponatremia 02/01/2011  . Hypokalemia 02/01/2011  . Barrett's esophagus 12/17/2008  . DIVERTICULITIS OF COLON 12/17/2008  . NONSPECIFIC ABN FINDING RAD & OTH EXAM GI TRACT 12/17/2008    Foley in place.  Doing well.  Advance diet.  7 Days Post-Op    LOS: 7 days   Matt B. Hassell Done, MD, [facility] [phone] beeper [phone]  02/11/2014 11:08 AM

## 2014-02-12 MED ORDER — PANTOPRAZOLE SODIUM 40 MG PO TBEC
40.0000 mg | DELAYED_RELEASE_TABLET | Freq: Two times a day (BID) | ORAL | Status: DC
  Administered 2014-02-12: 40 mg via ORAL
  Filled 2014-02-12 (×3): qty 1

## 2014-02-12 NOTE — Progress Notes (Signed)
Patient ID: [name], female   DOB: 1940-11-09, 74 y.o.   MRN: [id number] [facility] Progress Note:   8 Days Post-Op  Subjective: Mental status is clear.   Objective: Vital signs in last 24 hours: Temp:  [97.9 F (36.6 C)-98.2 F (36.8 C)] 97.9 F (36.6 C) (02/03 0547) Pulse Rate:  [60-87] 60 (02/03 0547) Resp:  [16] 16 (02/03 0547) BP: (152-158)/(61-74) 158/61 mmHg (02/03 0547) SpO2:  [100 %] 100 % (02/03 0547)  Intake/Output from previous day: 02/02 0701 - 02/03 0700 In: 1740 [P.O.:540; I.V.:1200] Out: 2025 [Urine:2000; Drains:25] Intake/Output this shift: Total I/O In: 200 [I.V.:200] Out: 700 [Urine:700]  Physical Exam: Work of breathing is normal. Incisions ok.    Lab Results:  No results found for this or any previous visit (from the past 48 hour(s)).  Radiology/Results: No results found.  Anti-infectives: Anti-infectives    Start     Dose/Rate Route Frequency Ordered Stop   02/04/14 1800  cefoTEtan (CEFOTAN) 2 g in dextrose 5 % 50 mL IVPB     2 g100 mL/hr over 30 Minutes Intravenous Every 12 hours 02/04/14 1450 02/04/14 1829   02/04/14 0600  cefoTEtan (CEFOTAN) 2 g in dextrose 5 % 50 mL IVPB     2 g100 mL/hr over 30 Minutes Intravenous On call to O.R. 02/04/14 0553 02/04/14 0730      Assessment/Plan: Problem List: Patient Active Problem List   Diagnosis Date Noted  . 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 Uterine polyp 08/06/2013  . Unspecified constipation 08/05/2013  . Thickened endometrium incidentally noted on CT 08/05/2013  . Diverticulitis of large intestine with abscess without bleeding 08/04/2013  . HTN (hypertension) 08/04/2013  . Ovarian cystic mass 02/01/2011  . Tubo-ovarian abscess 02/01/2011  . Hyponatremia 02/01/2011  . Hypokalemia 02/01/2011  . Barrett's esophagus 12/17/2008  . DIVERTICULITIS OF COLON 12/17/2008  . NONSPECIFIC ABN FINDING RAD & OTH EXAM GI TRACT 12/17/2008    Having some  bms.  Foley in place.  Taking meds OK for now.  Hopeful discharge tomorrow 8 Days Post-Op    LOS: 8 days   Matt B. Hassell Done, MD, [facility] [phone] beeper [phone]  02/12/2014 1:50 PM

## 2014-02-13 MED ORDER — HYDROCODONE-ACETAMINOPHEN 5-325 MG PO TABS
1.0000 | ORAL_TABLET | ORAL | Status: AC
  Administered 2014-02-13: 1 via ORAL
  Filled 2014-02-13: qty 1

## 2014-02-13 MED ORDER — HYDROCODONE-ACETAMINOPHEN 5-325 MG PO TABS: 1.0000 | ORAL_TABLET | ORAL | Status: DC | PRN

## 2014-02-13 NOTE — Progress Notes (Signed)
Nurse reviewed discharge instructions with pt and family.  Pt verbalized understanding of discharge instructions, follow up appointments and new medication.  Pt taught foley care and demonstrated how to change foley drainage bag from leg bag to standard drainage bag.  Pt's staples also removed and steri strips applied to midline incision prior to discharge.

## 2014-02-13 NOTE — Discharge Instructions (Signed)
Gluten-Free Diet for Celiac Disease Gluten is a protein found in wheat, rye, barley, and triticale (a cross between wheat and rye) grains. People with celiac disease need to have a gluten-free diet. With celiac disease, gluten interferes with the absorption of food and may also cause intestinal injury.  Strict compliance is important even during symptom-free periods. This means eliminating all foods with gluten from your diet permanently. This requires some significant changes but is very manageable. WHAT DO I NEED TO KNOW ABOUT A GLUTEN-FREE DIET?  Look for items labeled with "GF." Looking for GF will make it easier to identify products that are safe to eat.  Read all labels. Gluten may have been added as a minor ingredient where least expected, such as in shredded cheeses or ice creams. Always check food labels and investigate questionable ingredients. Talk to your dietitian or health care provider if you have questions about certain foods or need help finding GF foods.  Check when in doubt. If you are not sure whether an ingredient contains gluten, check with the manufacturer. Note that some manufacturers may change ingredients without notice. Always read labels.   Know how food is prepared. Since flour and cereal products are often used in the preparation of foods, it is important to be aware of the methods of preparation used, as well as the ingredients in the foods themselves. This is especially true when you are dining out. Ask restaurants if they have a gluten-free menu.  Watch for cross-contamination. Cross-contamination occurs when gluten-free foods come into contact with foods that contain gluten. It often happens during the manufacturing process. Always check the ingredient list and for warnings on packages, such as "may contain gluten."  Eat a balanced diet. It is important to still get enough fiber, iron, and B vitamins in your diet. Look for enriched whole grain gluten-free products  and continue to eat a well-balanced diet of the important non-grain items, such as vegetables, fruit, lean proteins, legumes, and dairy.  Consider taking a gluten-free multivitamin and mineral supplement. Discuss this with your health care provider. WHAT KEY WORDS HELP IDENTIFY GLUTEN? Know key words to help identify gluten. A dietitian can help you identify possible harmful ingredients in the foods you normally eat. Words to check for on food labels include:   Flour, enriched flour, bromated flour, white flour, durum flour, graham flour, phosphated flour, self-rising flour, semolina, or farina.  Starch, dextrin, modified food starch, or cereal.  Thickening, fillers, or emulsifiers.  Any kind of malt flavoring, extract, or syrup (malt is made from barley and includes malt vinegar, malted milk, and malted beverages).  Hydrolyzed vegetable protein. WHAT FOODS CAN I EAT? Below is a list of common foods that are allowed with a gluten-free diet.  Grains Products made from the following flours or grains:amaranth,bean flours, 100% buckwheat flour, corn, millet, nut flours or meals, GF oats, quinoa, rice, sorghum, teff, any all-purpose 100% GF flour mix, rice wafers, pure cornmeal tortillas, popcorn, some crackers, some chips, and hot cereals made from cornmeal. Ask your dietitian which specific hot and cold cereals are allowed. Hominy, rice or wild rice, and special GF pasta. Some Asian rice noodles or bean noodles. Arrowroot starch, corn bran, corn flour, corn germ, cornmeal, corn starch, potato flour, potato starch flour, and rice bran. Rice flours: plain, brown, and sweet. Rice polish, soy flour, tapioca starch. Vegetables All plain, fresh, frozen, or canned vegetables.  Fruits All fresh, frozen, canned, dried fruits, and fruit juices.  Meats and Other  Protein Foods Meat, fish, poultry, or eggs prepared without added wheat, rye, barley, or triticale. Some luncheon meat and some frankfurters.  Pure meat. All aged cheese, most processed cheese products, some cottage cheese, and some cream cheese. Dried beans, dried peas, and lentils.  Dairy Milk and yogurt made with allowed ingredients.  Beverages Coffee (regular or decaffeinated), tea, herbal tea (read label to be sure that no wheat flour has been added). Carbonated beverages and some root beers. Wine, sake, and distilled spirits, such as gin, vodka, and whiskey. GF beers and GF ciders.  Sweetsand Desserts Sugar, honey, some syrups, molasses, jelly, jam, plain hard candy, marshmallows, gumdrops, homemade candies free of wheat, rye, barley, or triticale. Coconut. Custard, some pudding mixes, and homemade puddings from cornstarch, rice, and tapioca. Gelatin desserts, sorbets, frozen ice pops, and sherbet. Cake, cookies, and other desserts prepared with allowed flours. Some commercial ice creams. Ask your dietitian about specific brands of dessert that are allowed.  Fats and Oils Butter, margarine, vegetable oil, sour cream not containing modified food starch, whipping cream, shortening, lard, cream, and some mayonnaise. Some commercial salad dressings. Peanut butter.  Other Homemade broth and soups made with allowed ingredients; some canned or frozen soups. Any other combination or prepared foods that do not contain gluten. Monosodium glutamate (MSG). Cider, rice, and wine vinegar. Baking soda and baking powder. Certain soy sauces (Tamari). Ask your dietitian about specific brands that are allowed. Nuts, coconut, chocolate, and pure cocoa powder. Salt, pepper, herbs, spices, extracts, and food colorings. The items listed above may not be a complete list of allowed foods or beverages. Contact your dietitian for more options.  WHAT FOODS CAN I NOT EAT? Below is a list of common foods that are not allowed with a gluten-free diet.  Grains Barley, bran, bulgur, cracked wheat, graham, malt, matzo, wheat germ, and all wheat and rye cereals  including spelt and kamut. Avoid cereals containing malt as a flavoring, such as rice cereal. Also avoid regular noodles, spaghetti, macaroni, and most packaged rice mixes, and all others containing wheat, rye, barley, or triticale.  Vegetables Most creamed vegetables, most vegetables canned in sauces, and any vegetables prepared with wheat, rye, barley, or triticale.  Fruits Thickened or prepared fruits and some pie fillings.  Meats and Other Protein Sources Any meat or meat alternative containing wheat, rye, barley, or gluten stabilizers (such as some hot dogs, salami, cold cuts, or sausage). Bread-containing products, such as Swiss steak, croquettes, and meatloaf. Most tuna canned in vegetable broth, [place] with hydrolyzed vegetable protein (HVP) injected as part of the basting, and any cheese product containing oat gum as an ingredient. Seitan. Imitation fish. Dairy Commercial chocolate milk, which may have cereal added, and malted milk. Beverages Certain cereal beverages. Beer and ciders (unless GF), ale, malted milk, and some root beers. Sweetsand Desserts Commercial candies containing wheat, rye, barley, or triticale. Certain toffees are dusted with wheat flour. Chocolate-coated nuts, which are often rolled in flour. Cakes, cookies, doughnuts, and pastries that are prepared with wheat, barley, rye, or triticale flour. Some commercial ice creams, ice cream flavors which contain cookies, crumbs, or cheesecake. Ice cream cones. Commercially prepared mixes for cakes, cookies, and other desserts unless marked GF. Bread pudding and other puddings thickened with flour. Fats and Oils Some commercial salad dressings and sour cream containing modified food starch.  Condiments Some curry powder, some dry seasoning mixes, some gravy extracts, some meat sauces, some ketchup, some prepared mustard, horseradish. Other All soups containing wheat,  rye, barley, or triticale flour. Bouillon and bouillon  cubes that contain HVP. Combination or prepared foods that contain gluten. Some soy sauce, some chip dips, and some chewing gum. Yeast extract (contains barley). Caramel color (may contain malt). The items listed above may not be a complete list of foods and beverages to avoid. Contact your dietitian for more information. Document Released: 12/27/2004 Document Revised: 05/13/2013 Document Reviewed: 10/31/2012 [facility] Patient Information 2015 [place], [place]. This information is not intended to replace advice given to you by your health care provider. Make sure you discuss any questions you have with your health care provider. Celiac Disease Celiac disease is a digestive disease that causes your body's natural defense system (immune system) to react against its own cells. It interferes with taking in (absorbing) nutrients from food. Celiac disease is also known as celiac sprue, nontropical sprue, and gluten-sensitive enteropathy. People who have celiac disease cannot tolerate gluten. Gluten is a protein found in wheat, rye, and barley. With time, celiac disease will damage the cells lining the small intestine. This leads to being unable to absorb nutrients from food (malabsorption), diarrhea, and nutritional problems. CAUSES  Celiac disease is genetic. This means you have a higher likelihood of getting the disease if someone in your family has or has had it. Up to 10% of your close relatives (parent, sibling, child) may also have the disease.  People with celiac disease tend to have other autoimmune diseases. The link may be genetic. These diseases include:  Dermatitis herpetiformis.  Thyroid disease.  Systemic lupus erythematosus.  Type 1 diabetes.  Liver disease.  Collagen vascular disease.  Rheumatoid arthritis.  Sjogren syndrome. SYMPTOMS  The symptoms of celiac disease vary from person to person. The symptoms are generally digestive or nutritional. Digestive symptoms  include:  Recurring belly (abdominal) bloating and pain.  Gas.  Long-term (chronic) diarrhea.  Pale, bad-smelling, greasy, or oily stool. Nutritional symptoms include:  Failure to thrive in infants.  Delayed growth in children.  Weight loss in children and adults.  Missed menstrual periods (often due to extreme weight loss).  Anemia.  Weakening bones (osteoporosis).  Fatigue and weakness.  Tingling or other signs of nerve damage (peripheral neuropathy).  Depression. DIAGNOSIS  If your symptoms and physical exam suggest that a digestive disorder or malnutrition is present, your caregiver may suspect celiac disease. You may have already begun a gluten-free diet. If symptoms persist, testing may be needed to confirm the diagnosis. Some tests are best done while you are on a normal, unrestricted diet. Tests may include:  Blood tests to check for nutritional deficiencies.  Blood tests to look for evidence that the body is producing antibodies against its own small intestine cells.  Taking a tissue sample (biopsy) from the small bowel for evaluation.  X-rays of the small bowel.  Evaluating the stool for fat.  Tests to check for nutrient absorption from the intestine. TREATMENT  It is important to seek treatment. Untreated celiac disease can cause growth problems (in children), anemia, osteoporosis, and possible nerve problems. A pregnant patient with untreated celiac disease has a higher risk of miscarriage, and the fetus has an increased risk of low birth weight and other growth problems. If celiac disease is diagnosed in the early stages, treatment can allow you to live a long, nearly symptom-free life. Treatment includes following a gluten-free diet. This means avoiding all foods that contain gluten. Eating even a small amount of gluten can damage your intestine. For most people, following this diet  will stop symptoms. It will heal existing intestinal damage and prevent  further damage. Improvements begin within days of starting the diet. The small intestine is usually completely healed within 3 to 6 months, or it may take up to 2 years for older adults. A small percentage of people do not improve on the gluten-free diet. Depending on your age and the stage at which you were diagnosed, some problems such as delayed growth and discolored teeth may not improve. Sometimes, damaged intestines cannot heal. If your intestines are not absorbing enough nutrients, you may need to receive nutrition supplements through an intravenous (IV) tube. Drug treatments are being tested for unresponsive celiac disease. In this case, you may need to be evaluated for complications of the disease. Your caregiver may also recommend:  A pneumonia vaccination.  Nutrients and other treatments for any nutritional deficiencies. Your caregiver can provide you with more information on a gluten-free diet. Discussion with a dietitian skilled in this illness will be valuable. Support groups may also be helpful. HOME CARE INSTRUCTIONS   Focus on a gluten-free diet. This diet must become a way of life.  Monitor your response to the gluten-free diet and treat any nutritional deficiencies.  Prepare ahead of time if you decide to eat outside the home.  Make and keep your regular follow-up visits with your caregiver.  Suggest to family members that they get screened for early signs of the disease. SEEK MEDICAL CARE IF:   You continue to have digestive symptoms (gas, cramping, diarrhea) despite a proper diet.  You have trouble sticking to the gluten-free diet.  You develop an itchy rash with groups of tiny blisters.  You develop severe weakness, balance problems, menstrual problems, or depression. Document Released: 12/27/2004 Document Revised: 05/13/2013 Document Reviewed: 04/15/2009 [facility] Patient Information 2015 [place], [place]. This information is not intended to replace advice given to  you by your health care provider. Make sure you discuss any questions you have with your health care provider.

## 2014-02-13 NOTE — Discharge Summary (Signed)
Physician Discharge Summary  Patient ID: [name] MRN: [id number] DOB/AGE: 19-Nov-1940 74 y.o.  Admit date: 02/04/2014 Discharge date: 02/13/2014  Admission Diagnoses:  Colovesical fistula  Discharge Diagnoses:  same  Active Problems:   Colovesical fistula   Surgery:  Lap assisted takedown of colovesical fistula, sigmoid colectomy, low anterior anastomosis  Discharged Condition: improved  Hospital Course:   Following surgery, the patient was admitted to resolved her postop ileus.  A Foley catheter was left in place during the entire hospitalization and will be removed by Dr. Gaynelle Arabian in the office.  The source of the fistula was diverticulitis.  A pelvic drain was placed and removed prior to discharge.  Incisions were healing well.  Ready for discharge taking gluten free solid diet.  Consults: none  Significant Diagnostic Studies: path    Discharge Exam: Blood pressure 169/68, pulse 86, temperature 98.6 F (37 C), temperature source Oral, resp. rate 16, height 5' (1.524 m), weight 129 lb (58.514 kg), SpO2 99 %. Incisions OK-staples removed.  Foley in place and will be removed in by Dr. Gaynelle Arabian in his office.    Disposition: 01-Home or Self Care     Medication List    ASK your doctor about these medications        acetaminophen 500 MG tablet  Commonly known as:  TYLENOL  Take 500 mg by mouth every 6 (six) hours as needed for moderate pain (pain).     acidophilus Caps capsule  Take 1 capsule by mouth every evening.     amLODipine 5 MG tablet  Commonly known as:  NORVASC  Take 5 mg by mouth daily.     losartan 100 MG tablet  Commonly known as:  COZAAR  Take 100 mg by mouth every morning.     losartan 100 MG tablet  Commonly known as:  COZAAR  Take 1 tablet (100 mg total) by mouth daily.     metoprolol succinate 50 MG 24 hr tablet  Commonly known as:  TOPROL-XL  Take 50 mg by mouth every evening.     metroNIDAZOLE 500 MG tablet  Commonly known as:  FLAGYL   Take 500 mg by mouth 3 (three) times daily.     neomycin 500 MG tablet  Commonly known as:  MYCIFRADIN  Take 1,000 mg by mouth 3 (three) times daily.     ondansetron 4 MG tablet  Commonly known as:  ZOFRAN  Take 1 tablet (4 mg total) by mouth every 6 (six) hours.     pantoprazole 40 MG tablet  Commonly known as:  PROTONIX  Take 40 mg by mouth 2 (two) times daily.     phenazopyridine 200 MG tablet  Commonly known as:  PYRIDIUM  Take 1 tablet (200 mg total) by mouth 3 (three) times daily as needed for pain.     polyethylene glycol packet  Commonly known as:  MIRALAX / GLYCOLAX  Take 17 g by mouth daily as needed for mild constipation or moderate constipation (constipation).     RESTASIS 0.05 % ophthalmic emulsion  Generic drug:  cycloSPORINE  Place 1 drop into both eyes 2 (two) times daily.     traMADol 50 MG tablet  Commonly known as:  ULTRAM  Take 50 mg by mouth 4 (four) times daily as needed for moderate pain (pain).     trimethoprim 100 MG tablet  Commonly known as:  TRIMPEX  Take 1 tablet (100 mg total) by mouth 1 day or 1 dose.  vitamin B-12 1000 MCG tablet  Commonly known as:  CYANOCOBALAMIN  Take 1,000 mcg by mouth daily at 12 noon.     Vitamin D3 2000 UNITS Tabs  Take 1 tablet by mouth daily at 12 noon.         SignedPedro Earls 02/13/2014, 7:27 AM

## 2014-02-22 ENCOUNTER — Telehealth (INDEPENDENT_AMBULATORY_CARE_PROVIDER_SITE_OTHER): Payer: Self-pay | Admitting: Surgery

## 2014-02-22 NOTE — Telephone Encounter (Signed)
[name] had a colovesical fistula operated on by Drs. Martin/Tannenbaum on 02/04/2014.  She call about constipation.  She said that she talked to our office yesterday, though I do not see a note to that effect.  She is having hard stool balls, but not distended.  I suggested she try colace.  She is already taking Miralax, which I advised against taking too much of.  She will check with our office if she gets worse.  Kara Overall, MD, [facility] Pager: [phone] Office phone:  [phone]

## 2014-08-29 ENCOUNTER — Encounter: Payer: Self-pay | Admitting: Physical Therapy

## 2014-08-29 ENCOUNTER — Ambulatory Visit: Payer: Medicare Other | Attending: Family Medicine | Admitting: Physical Therapy

## 2014-08-29 DIAGNOSIS — G5601 Carpal tunnel syndrome, right upper limb: Secondary | ICD-10-CM | POA: Insufficient documentation

## 2014-08-29 DIAGNOSIS — M7711 Lateral epicondylitis, right elbow: Secondary | ICD-10-CM | POA: Diagnosis not present

## 2014-08-29 DIAGNOSIS — M25611 Stiffness of right shoulder, not elsewhere classified: Secondary | ICD-10-CM | POA: Diagnosis present

## 2014-08-29 DIAGNOSIS — M25511 Pain in right shoulder: Secondary | ICD-10-CM | POA: Diagnosis present

## 2014-08-29 NOTE — Therapy (Signed)
[place], [place], [address] Phone: [phone]   Fax:  [phone]  Physical Therapy Evaluation  Patient Details  Name: [name] MRN: [id number] Date of Birth: 12/17/1940 Referring Provider:  Cari Caraway, MD  Encounter Date: 08/29/2014      PT End of Session - 08/29/14 1043    Visit Number 1   Number of Visits 12   Date for PT Re-Evaluation 10/10/14   PT Start Time 0840   PT Stop Time 0920   PT Time Calculation (min) 40 min   Activity Tolerance Patient tolerated treatment well;Patient limited by pain   Behavior During Therapy [facility] for tasks assessed/performed      Past Medical History  Diagnosis Date  . Hypertension   . Celiac disease     per Dr. Fuller Plan  . Diverticulitis     and diverticulosis in sigmoid colon   . GERD (gastroesophageal reflux disease)   . Erosive esophagitis   . Barrett's esophagus   . Hiatal hernia   . Blood in urine     has symptoms of UTI- burning and aching lower abdomen  . Heart murmur     DOES NOT CAUSE ANY PROBLEMS  . Degenerative joint disease     knees  . Colovesical fistula   . Rheumatic fever     as child  . Anemia     as child  . Complication of anesthesia 1966    adverse reaction to saddle block  . PONV (postoperative nausea and vomiting)     Past Surgical History  Procedure Laterality Date  . Appendectomy    . Carpal tunnel release Left   . Knee arthroscopy Right   . Bunionectomy Right   . Hysteroscopy w/d&c N/A 11/06/2013    Procedure: DILATATION AND CURETTAGE /HYSTEROSCOPY;  Surgeon: Daria Pastures, MD;  Location: [facility];  Service: Gynecology;  Laterality: N/A;  . Cystoscopy w/ retrogrades Left 12/23/2013    Procedure: CYSTOSCOPY WITH LEFT RETROGRADE PYELOGRAM;  Surgeon: Ailene Rud, MD;  Location: [facility];  Service: Urology;  Laterality: Left;  . Transurethral resection of bladder tumor N/A 12/23/2013    Procedure: TRANSURETHRAL  RESECTION OF BLADDER TUMOR (TURBT);  Surgeon: Ailene Rud, MD;  Location: [facility];  Service: Urology;  Laterality: N/A;  . Tonsillectomy      x2  . Laparoscopic partial colectomy N/A 02/04/2014    Procedure: LAPAROSCOPIC PARTIAL COLECTOMY;  Surgeon: Pedro Earls, MD;  Location: [facility];  Service: General;  Laterality: N/A;  . Take down of intestinal fistula N/A 02/04/2014    Procedure: TAKE DOWN OF COLOVESICAL FISTULA;  Surgeon: Pedro Earls, MD;  Location: [facility];  Service: General;  Laterality: N/A;  . Vesico-vaginal fistula repair N/A 02/04/2014    Procedure: COLOVESICAL FISTULA REPAIR ;  Surgeon: Ailene Rud, MD;  Location: [facility];  Service: Urology;  Laterality: N/A;    There were no vitals filed for this visit.  Visit Diagnosis:  Lateral epicondylitis (tennis elbow), right  Right shoulder pain  Carpal tunnel syndrome of right wrist      Subjective Assessment - 08/29/14 0927    Subjective PT. c/o of Rt. forearm pain that started about a month ago when rolling up umbrellas. Since then the pain has spread to her right shoulder. She is right handed and limited in ADLs and everyday activities due to her pain.    Pertinent History HTN, GERD, Erosive and Barretts esophagus  Limitations Lifting;Writing;House hold activities   How long can you sit comfortably? no effect   How long can you stand comfortably? no effect   How long can you walk comfortably? no effect   Patient Stated Goals return to  normal   Currently in Pain? Yes   Pain Score 4    Pain Location Shoulder   Pain Orientation Right   Pain Onset 1 to 4 weeks ago   Pain Frequency Intermittent   Aggravating Factors  dressing, picking up a cup of coffee, moving the arm, reaching, sleeping   Pain Relieving Factors ice, tylenol   Effect of Pain on Daily Activities affects ADLs, limited in everyday activites because she is right handed   Multiple Pain Sites Yes   Pain Score 1   Pain Location Elbow    Pain Orientation Right   Pain Descriptors / Indicators Burning;Aching   Pain Type Acute pain   Pain Radiating Towards shoulder   Pain Onset 1 to 4 weeks ago   Pain Frequency Intermittent   Aggravating Factors  carrying items, lifting, dressing, sleeping   Pain Relieving Factors ice, tylenol   Effect of Pain on Daily Activities limited in doing everyday activities because she is right handed            [facility] PT Assessment - 08/29/14 0844    Assessment   Medical Diagnosis Lateral epicondylitis and RC syndrome   Onset Date/Surgical Date --  month ago   Hand Dominance Right   Next MD Visit sept 8 for physical   Prior Therapy yes   Precautions   Precautions None   Restrictions   Weight Bearing Restrictions No   Balance Screen   Has the patient fallen in the past 6 months No   Has the patient had a decrease in activity level because of a fear of falling?  No   Is the patient reluctant to leave their home because of a fear of falling?  No   Home Environment   Living Environment Private residence   Living Arrangements Spouse/significant other   Type of [place] to enter   Entrance Stairs-Number of Steps 2   Entrance Stairs-Rails None   Home Layout One level   Home Equipment None   Prior Function   Level of Independence Independent   Cognition   Overall Cognitive Status Within Functional Limits for tasks assessed   Observation/Other Assessments   Observations holds Rt. arm stiff and elbow bent to 90   Skin Integrity intact   Focus on Therapeutic Outcomes (FOTO)  56%  goal 37%   Observation/Other Assessments-Edema    Edema --  none   Sensation   Light Touch Appears Intact   Coordination   Gross Motor Movements are Fluid and Coordinated Not tested   Fine Motor Movements are Fluid and Coordinated Not tested   Functional Tests   Functional tests --  n/a   Posture/Postural Control   Posture/Postural Control Postural limitations   Postural  Limitations Rounded Shoulders;Forward head   Tone   Assessment Location --  increased tone in upper traps bilaterally, pain on Rt.   ROM / Strength   AROM / PROM / Strength AROM;Strength   AROM   Overall AROM  Deficits   AROM Assessment Site Shoulder;Elbow   Right/Left Shoulder Right;Left   Right Shoulder Flexion 130 Degrees  130 sitting and supine   Right Shoulder ABduction 105 Degrees  100 in supine   Right Shoulder  Internal Rotation 85 Degrees  supine   Right Shoulder External Rotation 53 Degrees  supine   Left Shoulder Flexion 143 Degrees   Left Shoulder ABduction 165 Degrees   Left Shoulder Internal Rotation 83 Degrees   Left Shoulder External Rotation 55 Degrees   Right/Left Elbow --  [facility]   Strength   Overall Strength Within functional limits for tasks performed  strong but painful on Rt.    Overall Strength Comments all motions including wrist 5/5 bilaterally   Strength Assessment Site Shoulder;Elbow   Flexibility   Soft Tissue Assessment /Muscle Length no   Palpation   Palpation comment tender over rt. biceps tendon   Special Tests    Special Tests --  maudsley +. hawkins kennedy +, + biceps load                   [facility] Adult PT Treatment/Exercise - 08/29/14 0844    Exercises   Exercises Shoulder   Shoulder Exercises: ROM/Strengthening   Other ROM/Strengthening Exercises cane exercises/ flexion and abduction x5/each 2 sets                PT Education - 08/29/14 1043    Education provided Yes   Education Details POC/PT, HEP   Person(s) Educated Patient   Methods Explanation;Handout   Comprehension Verbalized understanding          PT Short Term Goals - 08/29/14 1055    PT SHORT TERM GOAL #1   Title Pt. will be I with initial HEP   Time 2   Period Weeks   Status New   PT SHORT TERM GOAL #2   Title Pt. will demonstrate and verbalize understanding of condition management including RICE, positioning, and HEP   Time 2   Period  Weeks   Status New   PT SHORT TERM GOAL #3   Title Pt will report a decrease in shoulder pain from 4/10 to 2/10 indicating proper pain management both in the clinic and at home   Time 3   Period Weeks   Status New           PT Long Term Goals - 08/29/14 1058    PT LONG TERM GOAL #1   Title Pt. will be I with advanced HEP   Time 6   Period Weeks   Status New   PT LONG TERM GOAL #2   Title Pt. will report no pain with functional activities indicating return to PLOF   Time 6   Period Weeks   Status New   PT LONG TERM GOAL #3   Title Pt will have a negative Maudleys (middle finger resisted special test) indicating decreased pain in extensor muscles   Time 6   Period Weeks   Status New   PT LONG TERM GOAL #4   Title Pt. Rt. shoulder  AROM in sitting will improve to match the L in order to regain functional mobility and return to PLOF   PT LONG TERM GOAL #5   Title FOTO will improve from 56% to 40% indicating improved functional mobility   Time 6   Period Weeks   Status New               Plan - 08/29/14 1045    Clinical Impression Statement Pt. is a 74 year old female that c/o forearm pain along extensor muscles and shoulder pain. Pt. has a history of carpal tunnel bilaterally and has had surgery on her L wrist.  Pt. presents with signs of possible Rt. shoulder impingement with adhesive capsulitis due to decrease use of Rt. UE since her injury. She also has increased pain, limited ROM, decreased performance in ADLs due to pain, and limited in functional activities. Pt. would benefit from skilled PT services to progress towards pain free PLOF.   Pt will benefit from skilled therapeutic intervention in order to improve on the following deficits Decreased range of motion;Impaired tone;Impaired UE functional use;Pain;Improper body mechanics;Postural dysfunction;Decreased strength;Decreased mobility   Rehab Potential Good   PT Frequency 2x / week   PT Duration 6 weeks   PT  Treatment/Interventions ADLs/Self Care Home Management;Ultrasound;Cryotherapy;Electrical Stimulation;Moist Heat;Therapeutic exercise;Therapeutic activities;Functional mobility training;Passive range of motion;Patient/family education;Dry needling;Taping;Manual techniques   PT Next Visit Plan strenthening of extensors, supine shoulder ROM exercises, ultrasound, manual massage   PT Home Exercise Plan cane exercises into flexion and abduction, wrist extensor isometrics   Consulted and Agree with Plan of Care Patient         Problem List Patient Active Problem List   Diagnosis Date Noted  . 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 Uterine polyp 08/06/2013  . Unspecified constipation 08/05/2013  . Thickened endometrium incidentally noted on CT 08/05/2013  . Diverticulitis of large intestine with abscess without bleeding 08/04/2013  . HTN (hypertension) 08/04/2013  . Ovarian cystic mass 02/01/2011  . Tubo-ovarian abscess 02/01/2011  . Hyponatremia 02/01/2011  . Hypokalemia 02/01/2011  . Barrett's esophagus 12/17/2008  . DIVERTICULITIS OF COLON 12/17/2008  . NONSPECIFIC ABN FINDING RAD & OTH EXAM GI TRACT 12/17/2008   Radonna Ricker, SPT Radonna Ricker 08/29/2014, 12:09 PM  [facility] [address] [place], [place], [address] Phone: [phone]   Fax:  [phone]

## 2014-09-03 ENCOUNTER — Ambulatory Visit: Payer: Medicare Other | Admitting: Physical Therapy

## 2014-09-03 DIAGNOSIS — M25511 Pain in right shoulder: Secondary | ICD-10-CM

## 2014-09-03 DIAGNOSIS — M25611 Stiffness of right shoulder, not elsewhere classified: Secondary | ICD-10-CM

## 2014-09-03 DIAGNOSIS — M7711 Lateral epicondylitis, right elbow: Secondary | ICD-10-CM | POA: Diagnosis not present

## 2014-09-03 DIAGNOSIS — G5601 Carpal tunnel syndrome, right upper limb: Secondary | ICD-10-CM

## 2014-09-03 NOTE — Therapy (Addendum)
[place], [place], [address] Phone: [phone]   Fax:  [phone]  Physical Therapy Treatment  Patient Details  Name: [name] MRN: [id number] Date of Birth: 04-Jun-1940 Referring Provider:  Cari Caraway, MD  Encounter Date: 09/03/2014      PT End of Session - 09/03/14 0900    Visit Number 2   Number of Visits 12   Date for PT Re-Evaluation 10/10/14   PT Start Time 0845   PT Stop Time 0945   PT Time Calculation (min) 60 min      Past Medical History  Diagnosis Date  . Hypertension   . Celiac disease     per Dr. Fuller Plan  . Diverticulitis     and diverticulosis in sigmoid colon   . GERD (gastroesophageal reflux disease)   . Erosive esophagitis   . Barrett's esophagus   . Hiatal hernia   . Blood in urine     has symptoms of UTI- burning and aching lower abdomen  . Heart murmur     DOES NOT CAUSE ANY PROBLEMS  . Degenerative joint disease     knees  . Colovesical fistula   . Rheumatic fever     as child  . Anemia     as child  . Complication of anesthesia 1966    adverse reaction to saddle block  . PONV (postoperative nausea and vomiting)     Past Surgical History  Procedure Laterality Date  . Appendectomy    . Carpal tunnel release Left   . Knee arthroscopy Right   . Bunionectomy Right   . Hysteroscopy w/d&c N/A 11/06/2013    Procedure: DILATATION AND CURETTAGE /HYSTEROSCOPY;  Surgeon: Daria Pastures, MD;  Location: [facility];  Service: Gynecology;  Laterality: N/A;  . Cystoscopy w/ retrogrades Left 12/23/2013    Procedure: CYSTOSCOPY WITH LEFT RETROGRADE PYELOGRAM;  Surgeon: Ailene Rud, MD;  Location: [facility];  Service: Urology;  Laterality: Left;  . Transurethral resection of bladder tumor N/A 12/23/2013    Procedure: TRANSURETHRAL RESECTION OF BLADDER TUMOR (TURBT);  Surgeon: Ailene Rud, MD;  Location: [facility];  Service: Urology;  Laterality: N/A;  .  Tonsillectomy      x2  . Laparoscopic partial colectomy N/A 02/04/2014    Procedure: LAPAROSCOPIC PARTIAL COLECTOMY;  Surgeon: Pedro Earls, MD;  Location: [facility];  Service: General;  Laterality: N/A;  . Take down of intestinal fistula N/A 02/04/2014    Procedure: TAKE DOWN OF COLOVESICAL FISTULA;  Surgeon: Pedro Earls, MD;  Location: [facility];  Service: General;  Laterality: N/A;  . Vesico-vaginal fistula repair N/A 02/04/2014    Procedure: COLOVESICAL FISTULA REPAIR ;  Surgeon: Ailene Rud, MD;  Location: [facility];  Service: Urology;  Laterality: N/A;    There were no vitals filed for this visit.  Visit Diagnosis:  Lateral epicondylitis (tennis elbow), right  Right shoulder pain  Carpal tunnel syndrome of right wrist  Stiffness of joint, shoulder region, right      Subjective Assessment - 09/03/14 0858    Subjective 4/10 shoulder pain at rest, 7/10 with overhead exercises and even more at night when trying to sleep. The forearm is just tender with palpation 2/10 and seems better than a week ago   Currently in Pain? Yes   Pain Score 4    Pain Location Shoulder   Pain Orientation Right   Pain Descriptors / Indicators Aching  [facility] Adult PT Treatment/Exercise - 09/03/14 0901    Shoulder Exercises: Standing   Row Strengthening;10 reps;Theraband   Theraband Level (Shoulder Row) Level 2 (Red)   Row Limitations also 10 reps scap squeezes   Shoulder Exercises: ROM/Strengthening   Other ROM/Strengthening Exercises cane exercises/ flexion and abduction x5/each 2 sets  supine   Other ROM/Strengthening Exercises Seated wrist extension isometrics and AROM   Shoulder Exercises: Stretch   Corner Stretch 3 reps;30 seconds   Modalities   Modalities Electrical Stimulation;Ultrasound   Acupuncturist Location Right shoulder   Electrical Stimulation Action IFC   Electrical Stimulation Parameters to  tolerance   Electrical Stimulation Goals Pain   Ultrasound   Ultrasound Location Right elbow extensors   Ultrasound Parameters 20% 1.0 w/cm2 3 mhx x 8 min   Ultrasound Goals Pain                PT Education - 09/03/14 1358    Education provided Yes   Education Details Row red band   Person(s) Educated Patient   Methods Explanation;Handout   Comprehension Verbalized understanding          PT Short Term Goals - 08/29/14 1055    PT SHORT TERM GOAL #1   Title Pt. will be I with initial HEP   Time 2   Period Weeks   Status New   PT SHORT TERM GOAL #2   Title Pt. will demonstrate and verbalize understanding of condition management including RICE, positioning, and HEP   Time 2   Period Weeks   Status New   PT SHORT TERM GOAL #3   Title Pt will report a decrease in shoulder pain from 4/10 to 2/10 indicating proper pain management both in the clinic and at home   Time 3   Period Weeks   Status New           PT Long Term Goals - 08/29/14 1058    PT LONG TERM GOAL #1   Title Pt. will be I with advanced HEP   Time 6   Period Weeks   Status New   PT LONG TERM GOAL #2   Title Pt. will report no pain with functional activities indicating return to PLOF   Time 6   Period Weeks   Status New   PT LONG TERM GOAL #3   Title Pt will have a negative Maudleys (middle finger resisted special test) indicating decreased pain in extensor muscles   Time 6   Period Weeks   Status New   PT LONG TERM GOAL #4   Title Pt. Rt. shoulder  AROM in sitting will improve to match the L in order to regain functional mobility and return to PLOF   PT LONG TERM GOAL #5   Title FOTO will improve from 56% to 40% indicating improved functional mobility   Time 6   Period Weeks   Status New               Plan - 09/03/14 1338    Clinical Impression Statement Review of HEP and addition of standing Row with red band. Pt has pain with overhead ROM in right shoulder. Her elbow pain is  decreasing. Trial of ultrasound to wrist extensors with significant decrease in tenderness. Trial of  estim for resting shoulder pain.     PT Next Visit Plan Assess benefit of [place] and E stim, therex as tolerated, add weight to wrist extension and work Nurse, mental health  if still minimal pain. add corner stretch for shoulders , review row        Problem List Patient Active Problem List   Diagnosis Date Noted  . 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 Uterine polyp 08/06/2013  . Unspecified constipation 08/05/2013  . Thickened endometrium incidentally noted on CT 08/05/2013  . Diverticulitis of large intestine with abscess without bleeding 08/04/2013  . HTN (hypertension) 08/04/2013  . Ovarian cystic mass 02/01/2011  . Tubo-ovarian abscess 02/01/2011  . Hyponatremia 02/01/2011  . Hypokalemia 02/01/2011  . Barrett's esophagus 12/17/2008  . DIVERTICULITIS OF COLON 12/17/2008  . NONSPECIFIC ABN FINDING RAD & OTH EXAM GI TRACT 12/17/2008    Dorene Ar, PTA 09/03/2014, 1:59 PM  [facility] [address] [place], [place], [address] Phone: [phone]   Fax:  [phone]

## 2014-09-03 NOTE — Patient Instructions (Signed)
   Copyright  VHI. All rights reserved.  Resistive Band Rowing   With resistive band anchored in door, grasp both ends. Keeping elbows bent, pull back, squeezing shoulder blades together. Hold __5__ seconds. Repeat ___15_ times. Do ___2_ sessions per day.  [email or web address].[email or web address]   Copyright  VHI. All rights reserved.

## 2014-09-09 ENCOUNTER — Ambulatory Visit: Payer: Medicare Other | Admitting: Physical Therapy

## 2014-09-09 DIAGNOSIS — M7711 Lateral epicondylitis, right elbow: Secondary | ICD-10-CM | POA: Diagnosis not present

## 2014-09-09 DIAGNOSIS — M25511 Pain in right shoulder: Secondary | ICD-10-CM

## 2014-09-09 DIAGNOSIS — G5601 Carpal tunnel syndrome, right upper limb: Secondary | ICD-10-CM

## 2014-09-09 DIAGNOSIS — M25611 Stiffness of right shoulder, not elsewhere classified: Secondary | ICD-10-CM

## 2014-09-09 NOTE — Therapy (Signed)
[place], [place], [address] Phone: [phone]   Fax:  [phone]  Physical Therapy Treatment  Patient Details  Name: [name] MRN: [id number] Date of Birth: [birth year] Referring Provider:  Cari Caraway, MD  Encounter Date: 09/09/2014      PT End of Session - 09/09/14 0939    Visit Number 3   Number of Visits 12   Date for PT Re-Evaluation 10/10/14   PT Start Time 0933   PT Stop Time 1017   PT Time Calculation (min) 44 min      Past Medical History  Diagnosis Date  . Hypertension   . Celiac disease     per Dr. Fuller Plan  . Diverticulitis     and diverticulosis in sigmoid colon   . GERD (gastroesophageal reflux disease)   . Erosive esophagitis   . Barrett's esophagus   . Hiatal hernia   . Blood in urine     has symptoms of UTI- burning and aching lower abdomen  . Heart murmur     DOES NOT CAUSE ANY PROBLEMS  . Degenerative joint disease     knees  . Colovesical fistula   . Rheumatic fever     as child  . Anemia     as child  . Complication of anesthesia 1966    adverse reaction to saddle block  . PONV (postoperative nausea and vomiting)     Past Surgical History  Procedure Laterality Date  . Appendectomy    . Carpal tunnel release Left   . Knee arthroscopy Right   . Bunionectomy Right   . Hysteroscopy w/d&c N/A 11/06/2013    Procedure: DILATATION AND CURETTAGE /HYSTEROSCOPY;  Surgeon: Daria Pastures, MD;  Location: [facility];  Service: Gynecology;  Laterality: N/A;  . Cystoscopy w/ retrogrades Left 12/23/2013    Procedure: CYSTOSCOPY WITH LEFT RETROGRADE PYELOGRAM;  Surgeon: Ailene Rud, MD;  Location: [facility];  Service: Urology;  Laterality: Left;  . Transurethral resection of bladder tumor N/A 12/23/2013    Procedure: TRANSURETHRAL RESECTION OF BLADDER TUMOR (TURBT);  Surgeon: Ailene Rud, MD;  Location: [facility];  Service: Urology;  Laterality: N/A;  .  Tonsillectomy      x2  . Laparoscopic partial colectomy N/A 02/04/2014    Procedure: LAPAROSCOPIC PARTIAL COLECTOMY;  Surgeon: Pedro Earls, MD;  Location: [facility];  Service: General;  Laterality: N/A;  . Take down of intestinal fistula N/A 02/04/2014    Procedure: TAKE DOWN OF COLOVESICAL FISTULA;  Surgeon: Pedro Earls, MD;  Location: [facility];  Service: General;  Laterality: N/A;  . Vesico-vaginal fistula repair N/A 02/04/2014    Procedure: COLOVESICAL FISTULA REPAIR ;  Surgeon: Ailene Rud, MD;  Location: [facility];  Service: Urology;  Laterality: N/A;    There were no vitals filed for this visit.  Visit Diagnosis:  Lateral epicondylitis (tennis elbow), right  Right shoulder pain  Carpal tunnel syndrome of right wrist  Stiffness of joint, shoulder region, right      Subjective Assessment - 09/09/14 0934    Subjective Shoulder is not much better. The ultrasound and estim helped for a day. The wrist is better   Currently in Pain? Yes   Pain Score 5    Pain Location Shoulder   Aggravating Factors  sleeping    Pain Relieving Factors ice. tylenol  [facility] Adult PT Treatment/Exercise - 09/09/14 0944    Shoulder Exercises: Standing   Extension Strengthening;Both;15 reps;Theraband   Theraband Level (Shoulder Extension) Level 2 (Red)   Row Strengthening;10 reps;Theraband   Theraband Level (Shoulder Row) Level 2 (Red)   Other Standing Exercises wall ladder flexion and scaption x 5 each to tolerance   Shoulder Exercises: ROM/Strengthening   Other ROM/Strengthening Exercises supine cane flexion, and pullovers x 10   Other ROM/Strengthening Exercises seated wrist AROM, then added 1 # and emphasized eccentric control x 15   Manual Therapy   Manual Therapy Soft tissue mobilization;Taping   Soft tissue mobilization upper traps, levqator and teres on right. Teres most tenderness   Kinesiotex Inhibit Muscle   Kinesiotix   Inhibit Muscle   shoulder taping for pain with movement out of small KT tape book                  PT Short Term Goals - 09/09/14 0940    PT SHORT TERM GOAL #1   Title Pt. will be I with initial HEP   Time 2   Period Weeks   Status Achieved   PT SHORT TERM GOAL #2   Title Pt. will demonstrate and verbalize understanding of condition management including RICE, positioning, and HEP   Time 2   Period Weeks   Status Achieved   PT SHORT TERM GOAL #3   Title Pt will report a decrease in shoulder pain from 4/10 to 2/10 indicating proper pain management both in the clinic and at home   Time 3   Period Weeks   Status On-going           PT Long Term Goals - 08/29/14 1058    PT LONG TERM GOAL #1   Title Pt. will be I with advanced HEP   Time 6   Period Weeks   Status New   PT LONG TERM GOAL #2   Title Pt. will report no pain with functional activities indicating return to PLOF   Time 6   Period Weeks   Status New   PT LONG TERM GOAL #3   Title Pt will have a negative Maudleys (middle finger resisted special test) indicating decreased pain in extensor muscles   Time 6   Period Weeks   Status New   PT LONG TERM GOAL #4   Title Pt. Rt. shoulder  AROM in sitting will improve to match the L in order to regain functional mobility and return to PLOF   PT LONG TERM GOAL #5   Title FOTO will improve from 56% to 40% indicating improved functional mobility   Time 6   Period Weeks   Status New               Plan - 09/09/14 1021    Clinical Impression Statement Improved tolerance to supine cane and to standing wall ladder exercises. Pt reports continued pain sleeping as most bothersome. Added eccentrics to wrist extension without pain. Manual therapy to right upper traps and teres. trial of  KT tape for shoulder pain.    PT Next Visit Plan assess benefit of manual and KT tape on shoulder pain. Continue AAROM, try scap mobs, try ultrasound to shoulder, continue tape if helpful          Problem List Patient Active Problem List   Diagnosis Date Noted  . 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 Uterine polyp 08/06/2013  . Unspecified  constipation 08/05/2013  . Thickened endometrium incidentally noted on CT 08/05/2013  . Diverticulitis of large intestine with abscess without bleeding 08/04/2013  . HTN (hypertension) 08/04/2013  . Ovarian cystic mass 02/01/2011  . Tubo-ovarian abscess 02/01/2011  . Hyponatremia 02/01/2011  . Hypokalemia 02/01/2011  . Barrett's esophagus 12/17/2008  . DIVERTICULITIS OF COLON 12/17/2008  . NONSPECIFIC ABN FINDING RAD & OTH EXAM GI TRACT 12/17/2008    Dorene Ar, PTA 09/09/2014, 10:32 AM  [facility] [address] [place], [place], [address] Phone: [phone]   Fax:  [phone]

## 2014-09-11 ENCOUNTER — Ambulatory Visit: Payer: Medicare Other | Attending: Family Medicine | Admitting: Physical Therapy

## 2014-09-11 DIAGNOSIS — M25511 Pain in right shoulder: Secondary | ICD-10-CM | POA: Diagnosis present

## 2014-09-11 DIAGNOSIS — M25611 Stiffness of right shoulder, not elsewhere classified: Secondary | ICD-10-CM | POA: Diagnosis present

## 2014-09-11 DIAGNOSIS — G5601 Carpal tunnel syndrome, right upper limb: Secondary | ICD-10-CM | POA: Insufficient documentation

## 2014-09-11 DIAGNOSIS — M7711 Lateral epicondylitis, right elbow: Secondary | ICD-10-CM | POA: Diagnosis not present

## 2014-09-11 NOTE — Therapy (Signed)
[place], [place], [address] Phone: [phone]   Fax:  [phone]  Physical Therapy Treatment  Patient Details  Name: [name] MRN: [id number] Date of Birth: 03-21-1940 Referring Provider:  Cari Caraway, MD  Encounter Date: 09/11/2014      PT End of Session - 09/11/14 0855    Visit Number 4   Number of Visits 12   Date for PT Re-Evaluation 10/10/14   PT Start Time 0846   PT Stop Time 0942   PT Time Calculation (min) 56 min      Past Medical History  Diagnosis Date  . Hypertension   . Celiac disease     per Dr. Fuller Plan  . Diverticulitis     and diverticulosis in sigmoid colon   . GERD (gastroesophageal reflux disease)   . Erosive esophagitis   . Barrett's esophagus   . Hiatal hernia   . Blood in urine     has symptoms of UTI- burning and aching lower abdomen  . Heart murmur     DOES NOT CAUSE ANY PROBLEMS  . Degenerative joint disease     knees  . Colovesical fistula   . Rheumatic fever     as child  . Anemia     as child  . Complication of anesthesia 1966    adverse reaction to saddle block  . PONV (postoperative nausea and vomiting)     Past Surgical History  Procedure Laterality Date  . Appendectomy    . Carpal tunnel release Left   . Knee arthroscopy Right   . Bunionectomy Right   . Hysteroscopy w/d&c N/A 11/06/2013    Procedure: DILATATION AND CURETTAGE /HYSTEROSCOPY;  Surgeon: Daria Pastures, MD;  Location: [facility];  Service: Gynecology;  Laterality: N/A;  . Cystoscopy w/ retrogrades Left 12/23/2013    Procedure: CYSTOSCOPY WITH LEFT RETROGRADE PYELOGRAM;  Surgeon: Ailene Rud, MD;  Location: [facility];  Service: Urology;  Laterality: Left;  . Transurethral resection of bladder tumor N/A 12/23/2013    Procedure: TRANSURETHRAL RESECTION OF BLADDER TUMOR (TURBT);  Surgeon: Ailene Rud, MD;  Location: [facility];  Service: Urology;  Laterality: N/A;  .  Tonsillectomy      x2  . Laparoscopic partial colectomy N/A 02/04/2014    Procedure: LAPAROSCOPIC PARTIAL COLECTOMY;  Surgeon: Pedro Earls, MD;  Location: [facility];  Service: General;  Laterality: N/A;  . Take down of intestinal fistula N/A 02/04/2014    Procedure: TAKE DOWN OF COLOVESICAL FISTULA;  Surgeon: Pedro Earls, MD;  Location: [facility];  Service: General;  Laterality: N/A;  . Vesico-vaginal fistula repair N/A 02/04/2014    Procedure: COLOVESICAL FISTULA REPAIR ;  Surgeon: Ailene Rud, MD;  Location: [facility];  Service: Urology;  Laterality: N/A;    There were no vitals filed for this visit.  Visit Diagnosis:  Lateral epicondylitis (tennis elbow), right  Right shoulder pain  Carpal tunnel syndrome of right wrist  Stiffness of joint, shoulder region, right      Subjective Assessment - 09/11/14 0854    Subjective I had the best nights sleep that I have had in the last month    Currently in Pain? No/denies            [facility] PT Assessment - 09/11/14 0853    AROM   Right Shoulder Flexion 108 Degrees  standing   Right Shoulder ABduction 108 Degrees  standing   Right Shoulder Internal Rotation --  reach to L1   Right Shoulder External Rotation --  reach to T2                      [facility] Adult PT Treatment/Exercise - 09/11/14 0911    Shoulder Exercises: Standing   External Rotation Right;10 reps;Theraband   Theraband Level (Shoulder External Rotation) Level 1 (Yellow)   Internal Rotation Right;10 reps;Theraband   Theraband Level (Shoulder Internal Rotation) Level 1 (Yellow)   Extension Strengthening;Both;15 reps;Theraband   Theraband Level (Shoulder Extension) Level 2 (Red)   Row Strengthening;10 reps;Theraband   Theraband Level (Shoulder Row) Level 2 (Red)   Other Standing Exercises wall ladder flexion and scaption x 5 each to tolerance   Shoulder Exercises: Pulleys   Flexion 1 minute   ABduction 1 minute   Shoulder Exercises:  ROM/Strengthening   Other ROM/Strengthening Exercises UE ranger on floor and 4 inch height pt has pain at 4 inch height-cues to depress scapula   Other ROM/Strengthening Exercises seated wrist AROM, then added 2# and emphasized eccentric control x 15   Shoulder Exercises: Stretch   Corner Stretch 3 reps;30 seconds   Modalities   Modalities Ultrasound   Ultrasound   Ultrasound Location right supraspinatus   Ultrasound Parameters 50% 1.2 w/cm2, 3 mhz x 8 min   Ultrasound Goals Pain   Manual Therapy   Manual Therapy Joint mobilization   Joint Mobilization gentle grade 2 AP and inferior glides to right shoulder   Kinesiotix   Inhibit Muscle  shoulder taping for pain with movement out of small KT tape book                PT Education - 09/11/14 0946    Education provided Yes   Education Details ER/IR yellow band   Person(s) Educated Patient   Methods Explanation;Handout   Comprehension Verbalized understanding          PT Short Term Goals - 09/09/14 0940    PT SHORT TERM GOAL #1   Title Pt. will be I with initial HEP   Time 2   Period Weeks   Status Achieved   PT SHORT TERM GOAL #2   Title Pt. will demonstrate and verbalize understanding of condition management including RICE, positioning, and HEP   Time 2   Period Weeks   Status Achieved   PT SHORT TERM GOAL #3   Title Pt will report a decrease in shoulder pain from 4/10 to 2/10 indicating proper pain management both in the clinic and at home   Time 3   Period Weeks   Status On-going           PT Long Term Goals - 08/29/14 1058    PT LONG TERM GOAL #1   Title Pt. will be I with advanced HEP   Time 6   Period Weeks   Status New   PT LONG TERM GOAL #2   Title Pt. will report no pain with functional activities indicating return to PLOF   Time 6   Period Weeks   Status New   PT LONG TERM GOAL #3   Title Pt will have a negative Maudleys (middle finger resisted special test) indicating decreased pain in  extensor muscles   Time 6   Period Weeks   Status New   PT LONG TERM GOAL #4   Title Pt. Rt. shoulder  AROM in sitting will improve to match the L in order to regain functional mobility and return to  PLOF   PT LONG TERM GOAL #5   Title FOTO will improve from 56% to 40% indicating improved functional mobility   Time 6   Period Weeks   Status New               Plan - 09/11/14 7373    Clinical Impression Statement Pt reports significant reduction in resting pain and no pain last night limiting her sleep. Pain increases over upper trap and supraspinatus with exercises over shoulder height. Used UE ranger for [employer] with pt tolerating ~ 100 degrees ROM. Joint mobs inferior and A/P gentle grade 2. Pain with supine ER/IR if RUE abducted. Issued neutral ER/IR with yellow band for HEP. Trial of [place] to upper trap, supraspinatus. Applied KT  tape again.    PT Next Visit Plan Continue tape, ultrasound, check for ionto order signed via fax. AAROM as tolerated for flexion/abduction, scap strength, neutral resisted IR/ER        Problem List Patient Active Problem List   Diagnosis Date Noted  . 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 Uterine polyp 08/06/2013  . Unspecified constipation 08/05/2013  . Thickened endometrium incidentally noted on CT 08/05/2013  . Diverticulitis of large intestine with abscess without bleeding 08/04/2013  . HTN (hypertension) 08/04/2013  . Ovarian cystic mass 02/01/2011  . Tubo-ovarian abscess 02/01/2011  . Hyponatremia 02/01/2011  . Hypokalemia 02/01/2011  . Barrett's esophagus 12/17/2008  . DIVERTICULITIS OF COLON 12/17/2008  . NONSPECIFIC ABN FINDING RAD & OTH EXAM GI TRACT 12/17/2008    Dorene Ar, PTA 09/11/2014, 10:16 AM  [facility] [address] [place], [place], [address] Phone: [phone]   Fax:  [phone]

## 2014-09-11 NOTE — Patient Instructions (Signed)
Strengthening: Resisted Internal Rotation   Hold tubing in left hand, elbow at side and forearm out. Rotate forearm in across body. Repeat __10__ times per set. Do ___2_ sets per session. Do __2__ sessions per day.  http://orth.exer.us/830   Copyright  VHI. All rights reserved.  Strengthening: Resisted External Rotation   Hold tubing in right hand, elbow at side and forearm across body. Rotate forearm out. Repeat _10___ times per set. Do __2__ sets per session. Do ___2_ sessions per day.

## 2014-09-16 ENCOUNTER — Ambulatory Visit: Payer: Medicare Other | Admitting: Physical Therapy

## 2014-09-16 DIAGNOSIS — M25511 Pain in right shoulder: Secondary | ICD-10-CM

## 2014-09-16 DIAGNOSIS — M25611 Stiffness of right shoulder, not elsewhere classified: Secondary | ICD-10-CM

## 2014-09-16 DIAGNOSIS — M7711 Lateral epicondylitis, right elbow: Secondary | ICD-10-CM | POA: Diagnosis not present

## 2014-09-16 DIAGNOSIS — G5601 Carpal tunnel syndrome, right upper limb: Secondary | ICD-10-CM

## 2014-09-16 NOTE — Therapy (Addendum)
[place], [place], [address] Phone: [phone]   Fax:  [phone]  Physical Therapy Treatment  Patient Details  Name: [name] MRN: [id number] Date of Birth: 15-Sep-1940 Referring Provider:  Cari Caraway, MD  Encounter Date: 09/16/2014      PT End of Session - 09/16/14 1028    Visit Number 5   Number of Visits 12   Date for PT Re-Evaluation 10/10/14   PT Start Time 0930   PT Stop Time 1022   PT Time Calculation (min) 52 min      Past Medical History  Diagnosis Date  . Hypertension   . Celiac disease     per Dr. Fuller Plan  . Diverticulitis     and diverticulosis in sigmoid colon   . GERD (gastroesophageal reflux disease)   . Erosive esophagitis   . Barrett's esophagus   . Hiatal hernia   . Blood in urine     has symptoms of UTI- burning and aching lower abdomen  . Heart murmur     DOES NOT CAUSE ANY PROBLEMS  . Degenerative joint disease     knees  . Colovesical fistula   . Rheumatic fever     as child  . Anemia     as child  . Complication of anesthesia 1966    adverse reaction to saddle block  . PONV (postoperative nausea and vomiting)     Past Surgical History  Procedure Laterality Date  . Appendectomy    . Carpal tunnel release Left   . Knee arthroscopy Right   . Bunionectomy Right   . Hysteroscopy w/d&c N/A 11/06/2013    Procedure: DILATATION AND CURETTAGE /HYSTEROSCOPY;  Surgeon: Daria Pastures, MD;  Location: [facility];  Service: Gynecology;  Laterality: N/A;  . Cystoscopy w/ retrogrades Left 12/23/2013    Procedure: CYSTOSCOPY WITH LEFT RETROGRADE PYELOGRAM;  Surgeon: Ailene Rud, MD;  Location: [facility];  Service: Urology;  Laterality: Left;  . Transurethral resection of bladder tumor N/A 12/23/2013    Procedure: TRANSURETHRAL RESECTION OF BLADDER TUMOR (TURBT);  Surgeon: Ailene Rud, MD;  Location: [facility];  Service: Urology;  Laterality: N/A;  .  Tonsillectomy      x2  . Laparoscopic partial colectomy N/A 02/04/2014    Procedure: LAPAROSCOPIC PARTIAL COLECTOMY;  Surgeon: Pedro Earls, MD;  Location: [facility];  Service: General;  Laterality: N/A;  . Take down of intestinal fistula N/A 02/04/2014    Procedure: TAKE DOWN OF COLOVESICAL FISTULA;  Surgeon: Pedro Earls, MD;  Location: [facility];  Service: General;  Laterality: N/A;  . Vesico-vaginal fistula repair N/A 02/04/2014    Procedure: COLOVESICAL FISTULA REPAIR ;  Surgeon: Ailene Rud, MD;  Location: [facility];  Service: Urology;  Laterality: N/A;    There were no vitals filed for this visit.  Visit Diagnosis:  Lateral epicondylitis (tennis elbow), right  Right shoulder pain  Carpal tunnel syndrome of right wrist  Stiffness of joint, shoulder region, right          [facility] PT Assessment - 09/16/14 0001    Observation/Other Assessments   Focus on Therapeutic Outcomes (FOTO)  56% (Unchanged)   goal 37%   AROM   Right Shoulder Flexion 115 Degrees  seated and supine   Right Shoulder ABduction 110 Degrees  seated   Right Shoulder Internal Rotation --  reach to L1   Right Shoulder External Rotation --  reach to T2  Strength   Strength Assessment Site Shoulder   Right/Left Shoulder Right   Right Shoulder Flexion 4-/5   Right Shoulder ABduction 3+/5   Right Shoulder Internal Rotation 3+/5   Right Shoulder External Rotation 4-/5                     [facility] Adult PT Treatment/Exercise - 09/16/14 0001    Shoulder Exercises: Pulleys   Flexion 1 minute   ABduction 1 minute   Shoulder Exercises: ROM/Strengthening   Other ROM/Strengthening Exercises UE ranger on wall able to tolerate shoulder height only   Other ROM/Strengthening Exercises seated wrist AROM, then added 2# and emphasized eccentric control x 15   Modalities   Modalities Ultrasound   Ultrasound   Ultrasound Location right supraspinatus   Ultrasound Parameters 50% 1.2w/cm2 3 mhz    Ultrasound Goals Pain   Manual Therapy   Kinesiotex Inhibit Muscle   Kinesiotix   Inhibit Muscle  shoulder taping for pain with movement out of small KT tape book                  PT Short Term Goals - 09/09/14 0940    PT SHORT TERM GOAL #1   Title Pt. will be I with initial HEP   Time 2   Period Weeks   Status Achieved   PT SHORT TERM GOAL #2   Title Pt. will demonstrate and verbalize understanding of condition management including RICE, positioning, and HEP   Time 2   Period Weeks   Status Achieved   PT SHORT TERM GOAL #3   Title Pt will report a decrease in shoulder pain from 4/10 to 2/10 indicating proper pain management both in the clinic and at home   Time 3   Period Weeks   Status On-going           PT Long Term Goals - 09/16/14 8891    PT LONG TERM GOAL #1   Title Pt. will be I with advanced HEP   Time 6   Period Weeks   Status On-going   PT LONG TERM GOAL #2   Title Pt. will report no pain with functional activities indicating return to PLOF   Time 6   Period Weeks   Status On-going   PT LONG TERM GOAL #3   Title Pt will have a negative Maudleys (middle finger resisted special test) indicating decreased pain in extensor muscles   Time 6   Period Weeks   Status On-going   PT LONG TERM GOAL #4   Title Pt. Rt. shoulder  AROM in sitting will improve to match the L in order to regain functional mobility and return to PLOF   Time 6   Period Weeks   Status On-going   PT LONG TERM GOAL #5   Title FOTO will improve from 56% to 40% indicating improved functional mobility   Time 6   Period Weeks   Status On-going                 Plan - 09/16/14 0939    Clinical Impression Statement Pt reports lateral elbow pain only occurs with typing activities. Her Right shoulder continues to be the most painful with 4/10 at rest and 9/10 when trying to sleep. Pt has received estim, manual, kinesiotape, ultrasound with minimal lasting benefit. Pt sees MD  on Thrusday and would like to consider MRI or orthopedic referral due to limmited progress with PT.  PT Next Visit Plan see what MD says         Problem List Patient Active Problem List   Diagnosis Date Noted  . 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 Uterine polyp 08/06/2013  . Unspecified constipation 08/05/2013  . Thickened endometrium incidentally noted on CT 08/05/2013  . Diverticulitis of large intestine with abscess without bleeding 08/04/2013  . HTN (hypertension) 08/04/2013  . Ovarian cystic mass 02/01/2011  . Tubo-ovarian abscess 02/01/2011  . Hyponatremia 02/01/2011  . Hypokalemia 02/01/2011  . Barrett's esophagus 12/17/2008  . DIVERTICULITIS OF COLON 12/17/2008  . NONSPECIFIC ABN FINDING RAD & OTH EXAM GI TRACT 12/17/2008    Dorene Ar, PTA 09/16/2014, 10:42 AM  [facility] [address] [place], [place], [address] Phone: [phone]   Fax:  [phone]    PHYSICAL THERAPY DISCHARGE SUMMARY  Visits from Start of Care: 5  Current functional level related to goals / functional outcomes : See above for goals met/unmet.  Pain in elbow mostly resolved but cont with severe pain in shoulder, esp relates to sleep.     Remaining deficits: See above   Education / Equipment: Pain mgmt, HEP, RICE  Plan: Patient agrees to discharge.  Patient goals were partially met. Patient is being discharged due to the physician's request.  ?????   Pt cancelled all appts, will follow up with Orthopedic MD and may return.    Raeford Razor, PT 09/29/2014 10:17 AM Phone: [phone] Fax: [phone]

## 2014-09-18 ENCOUNTER — Ambulatory Visit: Payer: Medicare Other | Admitting: Physical Therapy

## 2014-09-23 ENCOUNTER — Encounter: Payer: Medicare Other | Admitting: Physical Therapy

## 2014-09-25 ENCOUNTER — Encounter: Payer: Medicare Other | Admitting: Physical Therapy

## 2014-12-16 ENCOUNTER — Encounter: Payer: Self-pay | Admitting: Gastroenterology

## 2015-02-27 ENCOUNTER — Other Ambulatory Visit: Payer: Self-pay

## 2015-02-27 DIAGNOSIS — Z1231 Encounter for screening mammogram for malignant neoplasm of breast: Secondary | ICD-10-CM

## 2015-03-16 ENCOUNTER — Ambulatory Visit
Admission: RE | Admit: 2015-03-16 | Discharge: 2015-03-16 | Disposition: A | Discharge status: Home / Self Care | Payer: Medicare Other | Source: Ambulatory Visit

## 2015-03-16 DIAGNOSIS — Z1231 Encounter for screening mammogram for malignant neoplasm of breast: Secondary | ICD-10-CM

## 2016-10-03 ENCOUNTER — Other Ambulatory Visit: Payer: Self-pay | Admitting: Family Medicine

## 2016-10-03 DIAGNOSIS — Z1231 Encounter for screening mammogram for malignant neoplasm of breast: Secondary | ICD-10-CM

## 2016-10-18 ENCOUNTER — Ambulatory Visit
Admission: RE | Admit: 2016-10-18 | Discharge: 2016-10-18 | Disposition: A | Discharge status: Home / Self Care | Payer: Medicare Other | Source: Ambulatory Visit | Attending: Family Medicine | Admitting: Family Medicine

## 2016-10-18 DIAGNOSIS — Z1231 Encounter for screening mammogram for malignant neoplasm of breast: Secondary | ICD-10-CM

## 2017-10-18 ENCOUNTER — Other Ambulatory Visit: Payer: Self-pay | Admitting: Family Medicine

## 2017-10-18 DIAGNOSIS — Z1231 Encounter for screening mammogram for malignant neoplasm of breast: Secondary | ICD-10-CM

## 2018-02-08 DIAGNOSIS — M9905 Segmental and somatic dysfunction of pelvic region: Secondary | ICD-10-CM | POA: Diagnosis not present

## 2018-02-08 DIAGNOSIS — M9903 Segmental and somatic dysfunction of lumbar region: Secondary | ICD-10-CM | POA: Diagnosis not present

## 2018-02-08 DIAGNOSIS — M5137 Other intervertebral disc degeneration, lumbosacral region: Secondary | ICD-10-CM | POA: Diagnosis not present

## 2018-02-08 DIAGNOSIS — M419 Scoliosis, unspecified: Secondary | ICD-10-CM | POA: Diagnosis not present

## 2018-02-12 DIAGNOSIS — M9905 Segmental and somatic dysfunction of pelvic region: Secondary | ICD-10-CM | POA: Diagnosis not present

## 2018-02-12 DIAGNOSIS — M9903 Segmental and somatic dysfunction of lumbar region: Secondary | ICD-10-CM | POA: Diagnosis not present

## 2018-02-12 DIAGNOSIS — M5137 Other intervertebral disc degeneration, lumbosacral region: Secondary | ICD-10-CM | POA: Diagnosis not present

## 2018-02-12 DIAGNOSIS — M419 Scoliosis, unspecified: Secondary | ICD-10-CM | POA: Diagnosis not present

## 2018-02-13 DIAGNOSIS — M419 Scoliosis, unspecified: Secondary | ICD-10-CM | POA: Diagnosis not present

## 2018-02-13 DIAGNOSIS — M9903 Segmental and somatic dysfunction of lumbar region: Secondary | ICD-10-CM | POA: Diagnosis not present

## 2018-02-13 DIAGNOSIS — M5137 Other intervertebral disc degeneration, lumbosacral region: Secondary | ICD-10-CM | POA: Diagnosis not present

## 2018-02-13 DIAGNOSIS — M9905 Segmental and somatic dysfunction of pelvic region: Secondary | ICD-10-CM | POA: Diagnosis not present

## 2018-02-15 DIAGNOSIS — M9905 Segmental and somatic dysfunction of pelvic region: Secondary | ICD-10-CM | POA: Diagnosis not present

## 2018-02-15 DIAGNOSIS — M9903 Segmental and somatic dysfunction of lumbar region: Secondary | ICD-10-CM | POA: Diagnosis not present

## 2018-02-15 DIAGNOSIS — M419 Scoliosis, unspecified: Secondary | ICD-10-CM | POA: Diagnosis not present

## 2018-02-15 DIAGNOSIS — M5137 Other intervertebral disc degeneration, lumbosacral region: Secondary | ICD-10-CM | POA: Diagnosis not present

## 2018-02-19 DIAGNOSIS — M9903 Segmental and somatic dysfunction of lumbar region: Secondary | ICD-10-CM | POA: Diagnosis not present

## 2018-02-19 DIAGNOSIS — M5137 Other intervertebral disc degeneration, lumbosacral region: Secondary | ICD-10-CM | POA: Diagnosis not present

## 2018-02-19 DIAGNOSIS — M9905 Segmental and somatic dysfunction of pelvic region: Secondary | ICD-10-CM | POA: Diagnosis not present

## 2018-02-19 DIAGNOSIS — M419 Scoliosis, unspecified: Secondary | ICD-10-CM | POA: Diagnosis not present

## 2018-02-20 DIAGNOSIS — M419 Scoliosis, unspecified: Secondary | ICD-10-CM | POA: Diagnosis not present

## 2018-02-20 DIAGNOSIS — M9905 Segmental and somatic dysfunction of pelvic region: Secondary | ICD-10-CM | POA: Diagnosis not present

## 2018-02-20 DIAGNOSIS — M5137 Other intervertebral disc degeneration, lumbosacral region: Secondary | ICD-10-CM | POA: Diagnosis not present

## 2018-02-20 DIAGNOSIS — M9903 Segmental and somatic dysfunction of lumbar region: Secondary | ICD-10-CM | POA: Diagnosis not present

## 2018-02-22 DIAGNOSIS — M5137 Other intervertebral disc degeneration, lumbosacral region: Secondary | ICD-10-CM | POA: Diagnosis not present

## 2018-02-22 DIAGNOSIS — M9903 Segmental and somatic dysfunction of lumbar region: Secondary | ICD-10-CM | POA: Diagnosis not present

## 2018-02-22 DIAGNOSIS — M419 Scoliosis, unspecified: Secondary | ICD-10-CM | POA: Diagnosis not present

## 2018-02-22 DIAGNOSIS — M9905 Segmental and somatic dysfunction of pelvic region: Secondary | ICD-10-CM | POA: Diagnosis not present

## 2018-02-27 DIAGNOSIS — M9903 Segmental and somatic dysfunction of lumbar region: Secondary | ICD-10-CM | POA: Diagnosis not present

## 2018-02-27 DIAGNOSIS — M5137 Other intervertebral disc degeneration, lumbosacral region: Secondary | ICD-10-CM | POA: Diagnosis not present

## 2018-02-27 DIAGNOSIS — M9905 Segmental and somatic dysfunction of pelvic region: Secondary | ICD-10-CM | POA: Diagnosis not present

## 2018-02-27 DIAGNOSIS — M419 Scoliosis, unspecified: Secondary | ICD-10-CM | POA: Diagnosis not present

## 2018-03-01 DIAGNOSIS — M419 Scoliosis, unspecified: Secondary | ICD-10-CM | POA: Diagnosis not present

## 2018-03-01 DIAGNOSIS — M9905 Segmental and somatic dysfunction of pelvic region: Secondary | ICD-10-CM | POA: Diagnosis not present

## 2018-03-01 DIAGNOSIS — M5137 Other intervertebral disc degeneration, lumbosacral region: Secondary | ICD-10-CM | POA: Diagnosis not present

## 2018-03-01 DIAGNOSIS — M9903 Segmental and somatic dysfunction of lumbar region: Secondary | ICD-10-CM | POA: Diagnosis not present

## 2018-03-06 DIAGNOSIS — M5137 Other intervertebral disc degeneration, lumbosacral region: Secondary | ICD-10-CM | POA: Diagnosis not present

## 2018-03-06 DIAGNOSIS — M419 Scoliosis, unspecified: Secondary | ICD-10-CM | POA: Diagnosis not present

## 2018-03-06 DIAGNOSIS — M9905 Segmental and somatic dysfunction of pelvic region: Secondary | ICD-10-CM | POA: Diagnosis not present

## 2018-03-06 DIAGNOSIS — M9903 Segmental and somatic dysfunction of lumbar region: Secondary | ICD-10-CM | POA: Diagnosis not present

## 2018-03-08 DIAGNOSIS — M9905 Segmental and somatic dysfunction of pelvic region: Secondary | ICD-10-CM | POA: Diagnosis not present

## 2018-03-08 DIAGNOSIS — M5137 Other intervertebral disc degeneration, lumbosacral region: Secondary | ICD-10-CM | POA: Diagnosis not present

## 2018-03-08 DIAGNOSIS — M9903 Segmental and somatic dysfunction of lumbar region: Secondary | ICD-10-CM | POA: Diagnosis not present

## 2018-03-08 DIAGNOSIS — M419 Scoliosis, unspecified: Secondary | ICD-10-CM | POA: Diagnosis not present

## 2018-03-13 DIAGNOSIS — M419 Scoliosis, unspecified: Secondary | ICD-10-CM | POA: Diagnosis not present

## 2018-03-13 DIAGNOSIS — M9905 Segmental and somatic dysfunction of pelvic region: Secondary | ICD-10-CM | POA: Diagnosis not present

## 2018-03-13 DIAGNOSIS — M9903 Segmental and somatic dysfunction of lumbar region: Secondary | ICD-10-CM | POA: Diagnosis not present

## 2018-03-13 DIAGNOSIS — M5137 Other intervertebral disc degeneration, lumbosacral region: Secondary | ICD-10-CM | POA: Diagnosis not present

## 2018-03-15 DIAGNOSIS — M9903 Segmental and somatic dysfunction of lumbar region: Secondary | ICD-10-CM | POA: Diagnosis not present

## 2018-03-15 DIAGNOSIS — M9905 Segmental and somatic dysfunction of pelvic region: Secondary | ICD-10-CM | POA: Diagnosis not present

## 2018-03-15 DIAGNOSIS — M419 Scoliosis, unspecified: Secondary | ICD-10-CM | POA: Diagnosis not present

## 2018-03-15 DIAGNOSIS — M5137 Other intervertebral disc degeneration, lumbosacral region: Secondary | ICD-10-CM | POA: Diagnosis not present

## 2018-03-20 DIAGNOSIS — M5137 Other intervertebral disc degeneration, lumbosacral region: Secondary | ICD-10-CM | POA: Diagnosis not present

## 2018-03-20 DIAGNOSIS — M9905 Segmental and somatic dysfunction of pelvic region: Secondary | ICD-10-CM | POA: Diagnosis not present

## 2018-03-20 DIAGNOSIS — M9903 Segmental and somatic dysfunction of lumbar region: Secondary | ICD-10-CM | POA: Diagnosis not present

## 2018-03-20 DIAGNOSIS — M419 Scoliosis, unspecified: Secondary | ICD-10-CM | POA: Diagnosis not present

## 2018-03-22 DIAGNOSIS — M5137 Other intervertebral disc degeneration, lumbosacral region: Secondary | ICD-10-CM | POA: Diagnosis not present

## 2018-03-22 DIAGNOSIS — M9903 Segmental and somatic dysfunction of lumbar region: Secondary | ICD-10-CM | POA: Diagnosis not present

## 2018-03-22 DIAGNOSIS — M9905 Segmental and somatic dysfunction of pelvic region: Secondary | ICD-10-CM | POA: Diagnosis not present

## 2018-03-22 DIAGNOSIS — M419 Scoliosis, unspecified: Secondary | ICD-10-CM | POA: Diagnosis not present

## 2018-04-25 ENCOUNTER — Other Ambulatory Visit: Payer: Self-pay

## 2018-04-25 ENCOUNTER — Inpatient Hospital Stay (HOSPITAL_COMMUNITY)
Admission: EM | Admit: 2018-04-25 | Discharge: 2018-04-28 | DRG: 390 | Disposition: A | Discharge status: Home / Self Care | Payer: Medicare HMO | Attending: Family Medicine | Admitting: Family Medicine

## 2018-04-25 ENCOUNTER — Encounter (HOSPITAL_COMMUNITY): Payer: Self-pay

## 2018-04-25 DIAGNOSIS — Z8249 Family history of ischemic heart disease and other diseases of the circulatory system: Secondary | ICD-10-CM

## 2018-04-25 DIAGNOSIS — R112 Nausea with vomiting, unspecified: Secondary | ICD-10-CM | POA: Diagnosis present

## 2018-04-25 DIAGNOSIS — Z888 Allergy status to other drugs, medicaments and biological substances status: Secondary | ICD-10-CM | POA: Diagnosis not present

## 2018-04-25 DIAGNOSIS — R7989 Other specified abnormal findings of blood chemistry: Secondary | ICD-10-CM | POA: Diagnosis not present

## 2018-04-25 DIAGNOSIS — Z87891 Personal history of nicotine dependence: Secondary | ICD-10-CM

## 2018-04-25 DIAGNOSIS — Z978 Presence of other specified devices: Secondary | ICD-10-CM

## 2018-04-25 DIAGNOSIS — Z886 Allergy status to analgesic agent status: Secondary | ICD-10-CM

## 2018-04-25 DIAGNOSIS — E876 Hypokalemia: Secondary | ICD-10-CM | POA: Diagnosis not present

## 2018-04-25 DIAGNOSIS — K56609 Unspecified intestinal obstruction, unspecified as to partial versus complete obstruction: Secondary | ICD-10-CM | POA: Diagnosis not present

## 2018-04-25 DIAGNOSIS — I700 Atherosclerosis of aorta: Secondary | ICD-10-CM | POA: Diagnosis not present

## 2018-04-25 DIAGNOSIS — K219 Gastro-esophageal reflux disease without esophagitis: Secondary | ICD-10-CM | POA: Diagnosis not present

## 2018-04-25 DIAGNOSIS — Z79899 Other long term (current) drug therapy: Secondary | ICD-10-CM | POA: Diagnosis not present

## 2018-04-25 DIAGNOSIS — R109 Unspecified abdominal pain: Secondary | ICD-10-CM | POA: Diagnosis not present

## 2018-04-25 DIAGNOSIS — K5651 Intestinal adhesions [bands], with partial obstruction: Secondary | ICD-10-CM | POA: Diagnosis not present

## 2018-04-25 DIAGNOSIS — E860 Dehydration: Secondary | ICD-10-CM | POA: Diagnosis not present

## 2018-04-25 DIAGNOSIS — Z9049 Acquired absence of other specified parts of digestive tract: Secondary | ICD-10-CM | POA: Diagnosis not present

## 2018-04-25 DIAGNOSIS — K900 Celiac disease: Secondary | ICD-10-CM | POA: Diagnosis not present

## 2018-04-25 DIAGNOSIS — N19 Unspecified kidney failure: Secondary | ICD-10-CM | POA: Diagnosis present

## 2018-04-25 DIAGNOSIS — Z91018 Allergy to other foods: Secondary | ICD-10-CM | POA: Diagnosis not present

## 2018-04-25 DIAGNOSIS — K8681 Exocrine pancreatic insufficiency: Secondary | ICD-10-CM | POA: Diagnosis not present

## 2018-04-25 DIAGNOSIS — K449 Diaphragmatic hernia without obstruction or gangrene: Secondary | ICD-10-CM | POA: Diagnosis present

## 2018-04-25 DIAGNOSIS — D72829 Elevated white blood cell count, unspecified: Secondary | ICD-10-CM | POA: Diagnosis not present

## 2018-04-25 DIAGNOSIS — K56699 Other intestinal obstruction unspecified as to partial versus complete obstruction: Secondary | ICD-10-CM | POA: Diagnosis not present

## 2018-04-25 DIAGNOSIS — K5669 Other partial intestinal obstruction: Secondary | ICD-10-CM | POA: Diagnosis not present

## 2018-04-25 DIAGNOSIS — Z4682 Encounter for fitting and adjustment of non-vascular catheter: Secondary | ICD-10-CM | POA: Diagnosis not present

## 2018-04-25 DIAGNOSIS — I10 Essential (primary) hypertension: Secondary | ICD-10-CM | POA: Diagnosis present

## 2018-04-25 DIAGNOSIS — Z0189 Encounter for other specified special examinations: Secondary | ICD-10-CM

## 2018-04-25 DIAGNOSIS — R9431 Abnormal electrocardiogram [ECG] [EKG]: Secondary | ICD-10-CM | POA: Diagnosis not present

## 2018-04-25 DIAGNOSIS — Z881 Allergy status to other antibiotic agents status: Secondary | ICD-10-CM

## 2018-04-25 DIAGNOSIS — R1110 Vomiting, unspecified: Secondary | ICD-10-CM | POA: Diagnosis not present

## 2018-04-25 DIAGNOSIS — Z931 Gastrostomy status: Secondary | ICD-10-CM

## 2018-04-25 LAB — COMPREHENSIVE METABOLIC PANEL
ALT: 15 U/L (ref 0–44)
AST: 23 U/L (ref 15–41)
Albumin: 4 g/dL (ref 3.5–5.0)
Alkaline Phosphatase: 113 U/L (ref 38–126)
Anion gap: 13 (ref 5–15)
BUN: 20 mg/dL (ref 8–23)
CO2: 23 mmol/L (ref 22–32)
Calcium: 9.3 mg/dL (ref 8.9–10.3)
Chloride: 101 mmol/L (ref 98–111)
Creatinine, Ser: 0.95 mg/dL (ref 0.44–1.00)
GFR calc Af Amer: >60 mL/min (ref >60)
GFR calc non Af Amer: 57 mL/min — ABNORMAL LOW (ref >60)
Glucose, Bld: 185 mg/dL — ABNORMAL HIGH (ref 70–99)
Potassium: 3.7 mmol/L (ref 3.5–5.1)
Sodium: 137 mmol/L (ref 135–145)
Total Bilirubin: 1.1 mg/dL (ref 0.3–1.2)
Total Protein: 6.7 g/dL (ref 6.5–8.1)

## 2018-04-25 LAB — LIPASE, BLOOD: Lipase: 23 U/L (ref 11–51)

## 2018-04-25 LAB — CBC
HCT: 41 % (ref 36.0–46.0)
Hemoglobin: 13.9 g/dL (ref 12.0–15.0)
MCH: 30.8 pg (ref 26.0–34.0)
MCHC: 33.9 g/dL (ref 30.0–36.0)
MCV: 90.7 fL (ref 80.0–100.0)
Platelets: 332 10*3/uL (ref 150–400)
RBC: 4.52 MIL/uL (ref 3.87–5.11)
RDW: 14 % (ref 11.5–15.5)
WBC: 11 10*3/uL — ABNORMAL HIGH (ref 4.0–10.5)
nRBC: 0 % (ref 0.0–0.2)

## 2018-04-25 MED ORDER — SODIUM CHLORIDE 0.9% FLUSH
3.0000 mL | Freq: Once | INTRAVENOUS | Status: AC
  Administered 2018-04-26: 3 mL via INTRAVENOUS

## 2018-04-25 MED ORDER — FENTANYL CITRATE (PF) 100 MCG/2ML IJ SOLN
25.0000 ug | Freq: Once | INTRAMUSCULAR | Status: AC
  Administered 2018-04-26: 25 ug via INTRAVENOUS
  Filled 2018-04-25: qty 2

## 2018-04-25 MED ORDER — ONDANSETRON HCL 4 MG/2ML IJ SOLN
4.0000 mg | Freq: Once | INTRAMUSCULAR | Status: AC
  Administered 2018-04-26: 4 mg via INTRAVENOUS
  Filled 2018-04-25: qty 2

## 2018-04-25 MED ORDER — SODIUM CHLORIDE 0.9 % IV BOLUS
1000.0000 mL | Freq: Once | INTRAVENOUS | Status: AC
  Administered 2018-04-26: 1000 mL via INTRAVENOUS

## 2018-04-25 NOTE — ED Triage Notes (Signed)
Pt reports upper abd pain that started yesterday with vomiting. States she vomited all night long. Pt a.o, nad noted

## 2018-04-26 ENCOUNTER — Observation Stay (HOSPITAL_COMMUNITY): Payer: Medicare HMO

## 2018-04-26 ENCOUNTER — Encounter (HOSPITAL_COMMUNITY): Payer: Self-pay

## 2018-04-26 ENCOUNTER — Emergency Department (HOSPITAL_COMMUNITY): Payer: Medicare HMO

## 2018-04-26 ENCOUNTER — Inpatient Hospital Stay (HOSPITAL_COMMUNITY): Payer: Medicare HMO

## 2018-04-26 ENCOUNTER — Other Ambulatory Visit: Payer: Self-pay

## 2018-04-26 DIAGNOSIS — Z91018 Allergy to other foods: Secondary | ICD-10-CM | POA: Diagnosis not present

## 2018-04-26 DIAGNOSIS — E876 Hypokalemia: Secondary | ICD-10-CM | POA: Diagnosis present

## 2018-04-26 DIAGNOSIS — Z881 Allergy status to other antibiotic agents status: Secondary | ICD-10-CM | POA: Diagnosis not present

## 2018-04-26 DIAGNOSIS — R7989 Other specified abnormal findings of blood chemistry: Secondary | ICD-10-CM | POA: Diagnosis not present

## 2018-04-26 DIAGNOSIS — N19 Unspecified kidney failure: Secondary | ICD-10-CM | POA: Diagnosis present

## 2018-04-26 DIAGNOSIS — K219 Gastro-esophageal reflux disease without esophagitis: Secondary | ICD-10-CM | POA: Diagnosis present

## 2018-04-26 DIAGNOSIS — I10 Essential (primary) hypertension: Secondary | ICD-10-CM

## 2018-04-26 DIAGNOSIS — Z79899 Other long term (current) drug therapy: Secondary | ICD-10-CM | POA: Diagnosis not present

## 2018-04-26 DIAGNOSIS — K8681 Exocrine pancreatic insufficiency: Secondary | ICD-10-CM | POA: Diagnosis present

## 2018-04-26 DIAGNOSIS — E860 Dehydration: Secondary | ICD-10-CM | POA: Diagnosis present

## 2018-04-26 DIAGNOSIS — Z8249 Family history of ischemic heart disease and other diseases of the circulatory system: Secondary | ICD-10-CM | POA: Diagnosis not present

## 2018-04-26 DIAGNOSIS — R109 Unspecified abdominal pain: Secondary | ICD-10-CM | POA: Diagnosis present

## 2018-04-26 DIAGNOSIS — K56609 Unspecified intestinal obstruction, unspecified as to partial versus complete obstruction: Secondary | ICD-10-CM | POA: Diagnosis present

## 2018-04-26 DIAGNOSIS — K900 Celiac disease: Secondary | ICD-10-CM | POA: Diagnosis present

## 2018-04-26 DIAGNOSIS — D72829 Elevated white blood cell count, unspecified: Secondary | ICD-10-CM | POA: Diagnosis present

## 2018-04-26 DIAGNOSIS — Z9049 Acquired absence of other specified parts of digestive tract: Secondary | ICD-10-CM | POA: Diagnosis not present

## 2018-04-26 DIAGNOSIS — Z87891 Personal history of nicotine dependence: Secondary | ICD-10-CM | POA: Diagnosis not present

## 2018-04-26 DIAGNOSIS — K5651 Intestinal adhesions [bands], with partial obstruction: Secondary | ICD-10-CM | POA: Diagnosis present

## 2018-04-26 DIAGNOSIS — Z4682 Encounter for fitting and adjustment of non-vascular catheter: Secondary | ICD-10-CM | POA: Diagnosis not present

## 2018-04-26 DIAGNOSIS — Z888 Allergy status to other drugs, medicaments and biological substances status: Secondary | ICD-10-CM | POA: Diagnosis not present

## 2018-04-26 DIAGNOSIS — R112 Nausea with vomiting, unspecified: Secondary | ICD-10-CM | POA: Diagnosis present

## 2018-04-26 DIAGNOSIS — Z886 Allergy status to analgesic agent status: Secondary | ICD-10-CM | POA: Diagnosis not present

## 2018-04-26 DIAGNOSIS — K449 Diaphragmatic hernia without obstruction or gangrene: Secondary | ICD-10-CM | POA: Diagnosis present

## 2018-04-26 LAB — CBC
HCT: 38.5 % (ref 36.0–46.0)
Hemoglobin: 12.7 g/dL (ref 12.0–15.0)
MCH: 30.1 pg (ref 26.0–34.0)
MCHC: 33 g/dL (ref 30.0–36.0)
MCV: 91.2 fL (ref 80.0–100.0)
Platelets: 300 10*3/uL (ref 150–400)
RBC: 4.22 MIL/uL (ref 3.87–5.11)
RDW: 14.1 % (ref 11.5–15.5)
WBC: 8.9 10*3/uL (ref 4.0–10.5)
nRBC: 0 % (ref 0.0–0.2)

## 2018-04-26 LAB — URINALYSIS, ROUTINE W REFLEX MICROSCOPIC
Bacteria, UA: NONE SEEN
Bilirubin Urine: NEGATIVE
Glucose, UA: NEGATIVE mg/dL
Hgb urine dipstick: NEGATIVE
Ketones, ur: 20 mg/dL — AB
Nitrite: NEGATIVE
Protein, ur: NEGATIVE mg/dL
Specific Gravity, Urine: >1.046 — ABNORMAL HIGH (ref 1.005–1.030)
pH: 5 (ref 5.0–8.0)

## 2018-04-26 LAB — BASIC METABOLIC PANEL
Anion gap: 13 (ref 5–15)
BUN: 21 mg/dL (ref 8–23)
CO2: 20 mmol/L — ABNORMAL LOW (ref 22–32)
Calcium: 8.6 mg/dL — ABNORMAL LOW (ref 8.9–10.3)
Chloride: 104 mmol/L (ref 98–111)
Creatinine, Ser: 0.64 mg/dL (ref 0.44–1.00)
GFR calc Af Amer: >60 mL/min (ref >60)
GFR calc non Af Amer: >60 mL/min (ref >60)
Glucose, Bld: 127 mg/dL — ABNORMAL HIGH (ref 70–99)
Potassium: 3.7 mmol/L (ref 3.5–5.1)
Sodium: 137 mmol/L (ref 135–145)

## 2018-04-26 LAB — MAGNESIUM: Magnesium: 2 mg/dL (ref 1.7–2.4)

## 2018-04-26 LAB — LACTIC ACID, PLASMA: Lactic Acid, Venous: 1.4 mmol/L (ref 0.5–1.9)

## 2018-04-26 LAB — TROPONIN I: Troponin I: <0.03 ng/mL (ref <0.03)

## 2018-04-26 MED ORDER — DEXTROSE-NACL 5-0.45 % IV SOLN
INTRAVENOUS | Status: DC
  Administered 2018-04-26 – 2018-04-27 (×3): via INTRAVENOUS

## 2018-04-26 MED ORDER — SODIUM CHLORIDE 0.9 % IV SOLN
INTRAVENOUS | Status: DC
  Administered 2018-04-26 (×2): via INTRAVENOUS

## 2018-04-26 MED ORDER — SODIUM CHLORIDE 0.9 % IV SOLN: INTRAVENOUS | Status: DC

## 2018-04-26 MED ORDER — FENTANYL CITRATE (PF) 100 MCG/2ML IJ SOLN: 25.0000 ug | INTRAMUSCULAR | Status: DC | PRN

## 2018-04-26 MED ORDER — IOHEXOL 300 MG/ML  SOLN
100.0000 mL | Freq: Once | INTRAMUSCULAR | Status: AC | PRN
  Administered 2018-04-26: 100 mL via INTRAVENOUS

## 2018-04-26 MED ORDER — ENOXAPARIN SODIUM 40 MG/0.4ML ~~LOC~~ SOLN: 40.0000 mg | SUBCUTANEOUS | Status: DC

## 2018-04-26 MED ORDER — DIATRIZOATE MEGLUMINE & SODIUM 66-10 % PO SOLN
90.0000 mL | Freq: Once | ORAL | Status: AC
  Administered 2018-04-26: 90 mL via NASOGASTRIC
  Filled 2018-04-26: qty 90

## 2018-04-26 MED ORDER — ONDANSETRON HCL 4 MG/2ML IJ SOLN: 4.0000 mg | Freq: Four times a day (QID) | INTRAMUSCULAR | Status: DC | PRN

## 2018-04-26 MED ORDER — ENOXAPARIN SODIUM 40 MG/0.4ML ~~LOC~~ SOLN
40.0000 mg | SUBCUTANEOUS | Status: DC
  Administered 2018-04-26 – 2018-04-27 (×2): 40 mg via SUBCUTANEOUS
  Filled 2018-04-26 (×2): qty 0.4

## 2018-04-26 MED ORDER — HYDRALAZINE HCL 20 MG/ML IJ SOLN: 10.0000 mg | Freq: Four times a day (QID) | INTRAMUSCULAR | Status: DC | PRN

## 2018-04-26 MED ORDER — PANTOPRAZOLE SODIUM 40 MG IV SOLR
40.0000 mg | INTRAVENOUS | Status: DC
  Administered 2018-04-26 – 2018-04-27 (×2): 40 mg via INTRAVENOUS
  Filled 2018-04-26 (×2): qty 40

## 2018-04-26 MED ORDER — SODIUM CHLORIDE 0.9 % IV BOLUS
1000.0000 mL | Freq: Once | INTRAVENOUS | Status: AC
  Administered 2018-04-26: 1000 mL via INTRAVENOUS

## 2018-04-26 MED ORDER — PHENOL 1.4 % MT LIQD
1.0000 | OROMUCOSAL | Status: DC | PRN
  Administered 2018-04-26: 1 via OROMUCOSAL

## 2018-04-26 NOTE — Plan of Care (Signed)
  Problem: Education: Goal: Knowledge of General Education information will improve Description Including pain rating scale, medication(s)/side effects and non-pharmacologic comfort measures Outcome: Progressing   Problem: Clinical Measurements: Goal: Respiratory complications will improve Outcome: Not Applicable   Problem: Activity: Goal: Risk for activity intolerance will decrease Outcome: Progressing   Problem: Elimination: Goal: Will not experience complications related to bowel motility Outcome: Progressing   Problem: Pain Managment: Goal: General experience of comfort will improve Outcome: Progressing

## 2018-04-26 NOTE — Progress Notes (Addendum)
[facility]/Trauma Progress Note      Assessment/Plan Hx of diverticulitis, colovesical fistula, appendectomy  SBO with likely transition point in RLQ - NGT, SBO protocol - we will follow - encourage ambulation, NGT can be clamped so pt can walk  FEN: NPO, NGT, IVF VTE: SCD's, okay for lovenox from our standpoint but will defer to medicine ID: none Follow up: TBD  DISPO: SBO protocol, NGT was kinked in stomach and asked nurse to pull back 4-5cm, repeat abd film pending    LOS: 0 days    Subjective: CC: SBO  Pt abdominal pain is a little improved. She is having flatus.   Objective: Vital signs in last 24 hours: Temp:  [98.2 F (36.8 C)-98.3 F (36.8 C)] 98.2 F (36.8 C) (04/16 0440) Pulse Rate:  [73-109] 84 (04/16 0440) Resp:  [16-17] 17 (04/16 0440) BP: (136-186)/(59-78) 169/66 (04/16 0440) SpO2:  [95 %-100 %] 100 % (04/16 0440) Weight:  [62.3 kg] 62.3 kg (04/16 0400) Last BM Date: 04/25/18  Intake/Output from previous day: No intake/output data recorded. Intake/Output this shift: No intake/output data recorded.  PE: Gen:  Alert, NAD, pleasant, cooperative Pulm:  Rate and effort normal Abd: Soft, ND, +BS, generalized TTP with mild guarding, no peritonitis  Skin: no rashes noted, warm and dry   Anti-infectives: Anti-infectives (From admission, onward)   None      Lab Results:  Recent Labs    04/25/18 2030 04/26/18 0351  WBC 11.0* 8.9  HGB 13.9 12.7  HCT 41.0 38.5  PLT 332 300   BMET Recent Labs    04/25/18 2030 04/26/18 0351  NA 137 137  K 3.7 3.7  CL 101 104  CO2 23 20*  GLUCOSE 185* 127*  BUN 20 21  CREATININE 0.95 0.64  CALCIUM 9.3 8.6*   PT/INR No results for input(s): LABPROT, INR in the last 72 hours. CMP     Component Value Date/Time   NA 137 04/26/2018 0351   K 3.7 04/26/2018 0351   CL 104 04/26/2018 0351   CO2 20 (L) 04/26/2018 0351   GLUCOSE 127 (H) 04/26/2018 0351   BUN 21 04/26/2018 0351    CREATININE 0.64 04/26/2018 0351   CALCIUM 8.6 (L) 04/26/2018 0351   PROT 6.7 04/25/2018 2030   ALBUMIN 4.0 04/25/2018 2030   AST 23 04/25/2018 2030   ALT 15 04/25/2018 2030   ALKPHOS 113 04/25/2018 2030   BILITOT 1.1 04/25/2018 2030   GFRNONAA >60 04/26/2018 0351   GFRAA >60 04/26/2018 0351   Lipase     Component Value Date/Time   LIPASE 23 04/25/2018 2030    Studies/Results: Ct Abdomen Pelvis W Contrast  Result Date: 04/26/2018 CLINICAL DATA:  Upper abdominal pain with vomiting EXAM: CT ABDOMEN AND PELVIS WITH CONTRAST TECHNIQUE: Multidetector CT imaging of the abdomen and pelvis was performed using the standard protocol following bolus administration of intravenous contrast. CONTRAST:  121m OMNIPAQUE 300 COMPARISON:  12/12/2013 FINDINGS: Lower chest: No acute abnormality. Hepatobiliary: No focal liver abnormality is seen. No gallstones, gallbladder wall thickening, or biliary dilatation. Pancreas: Unremarkable. No pancreatic ductal dilatation or surrounding inflammatory changes. Spleen: Normal in size without focal abnormality. Adrenals/Urinary Tract: Adrenal glands are within normal limits. Kidneys are well visualized bilaterally. No renal calculi or obstructive changes are seen. Cystic lesion is noted in the right kidney stable from the prior study. Normal excretion of contrast is seen. The bladder is decompressed. Stomach/Bowel: Postsurgical changes are noted in the rectosigmoid region. No obstructive or inflammatory  changes of the colon are noted. The appendix has been surgically removed. Mildly prominent fluid-filled loops of jejunum and likely proximal ileum are noted. Some fecalization of bowel contents is noted in the right lower quadrant with relatively normal small bowel noted distally. A transition zone is seen in the right lower quadrant best seen on image number 34 of series 6. No mass lesion is noted in this region although the bowel loops are somewhat intimately apposed to the  anterior abdominal wall likely representing some adhesions. Vascular/Lymphatic: Aortic atherosclerosis. No enlarged abdominal or pelvic lymph nodes. Reproductive: Uterus and bilateral adnexa are unremarkable. Other: No abdominal wall hernia or abnormality. No abdominopelvic ascites. Musculoskeletal: Degenerative changes of lumbar spine are noted. IMPRESSION: Mildly prominent loops of jejunum and proximal ileum with evidence of fecalization of small bowel contents and abrupt caliber change in the right lower quadrant consistent with partial small bowel obstruction likely related to adhesions. No other focal abnormality is noted. Electronically Signed   By: Inez Catalina M.D.   On: 04/26/2018 01:44   Dg Abdomen Acute W/chest  Result Date: 04/26/2018 CLINICAL DATA:  78 y/o  F; abdominal pain and vomiting. EXAM: DG ABDOMEN ACUTE W/ 1V CHEST COMPARISON:  02/04/2014 abdomen radiographs. FINDINGS: There is no evidence of dilated bowel loops or free intraperitoneal air. No radiopaque calculi or other significant radiographic abnormality is seen. Heart size and mediastinal contours are within normal limits. Both lungs are clear. Moderate S-shaped curvature of the spine. Aortic calcific atherosclerosis. Surgical sutures project over the pelvis. IMPRESSION: Normal bowel gas pattern.  No acute cardiopulmonary disease. Electronically Signed   By: Kristine Garbe M.D.   On: 04/26/2018 00:33   Dg Abd Portable 1v-small Bowel Protocol-position Verification  Result Date: 04/26/2018 CLINICAL DATA:  Nasogastric tube placement. EXAM: PORTABLE ABDOMEN - 1 VIEW COMPARISON:  Radiographs and CT scan of same day. FINDINGS: Interval placement of nasogastric tube which appears to be looped within proximal stomach. Distal tip is in expected position of gastric cardia. The catheter may be kinked. No radiographic abnormal bowel gas pattern is noted. Residual contrast is noted in the bladder. IMPRESSION: Interval placement of  nasogastric tube which is looped within proximal stomach, and distal tip is noted in the gastric cardia or most proximal portion of the stomach. A portion of the nasogastric tube appears to be kinked within the gastric lumen. Electronically Signed   By: Marijo Conception M.D.   On: 04/26/2018 09:37      Kalman Drape , [facility] Surgery 04/26/2018, 10:40 AM  Pager: [phone] Mon-Wed, Friday 7:00am-4:30pm Thurs 7am-11:30am  Consults: [phone]

## 2018-04-26 NOTE — Progress Notes (Signed)
Patient seen and evaluated, chart reviewed, please see EMR for updated orders. Please see full H&P dictated by admitting physician Dr. Velia Meyer for same date of service.    Brief summary  78 y.o. female with medical history significant for colovesicular fistula status post surgical repair, diverticulitis with abscess status post partial colectomy in 2016, HTN admitted   on 04/25/2018 with partial small bowel obstruction a  A/p 1)PSBO--- surgical consult appreciated, continue NG tube and small bowel protocol  2)FEN--- n.p.o., give IV dextrose solution  3)HTN--IV hydralazine as needed elevated BP  4) prophylaxis --- IV Protonix for GI prophylaxis, Lovenox for DVT prophylaxis  Patient seen and evaluated, chart reviewed, please see EMR for updated orders. Please see full H&P dictated by admitting physician Dr. Velia Meyer for same date of service.

## 2018-04-26 NOTE — Consult Note (Signed)
Reason for Consult:SBO Referring Physician: Rancour  KAMIL HANIGAN is an 78 y.o. female.  HPI:  Pt is a 78 yo F who presented to the ED with around 24-36 hours of abdominal pain, nausea, and vomiting.  She started feeling poorly Tuesday April 14 with upper abdominal pain and bloating.  She was finishing up gathering tax forms and took them to drop off to accountant.  After that, she just went to bed and didn't have dinner.  That evening, she started throwing up.  She said, "It didn't have any force, but I had to have a bowl everywhere I went."  She has not had pain like this before.  She does feel bloated.  She thought having a bowel movement would make her feel better, but she hasn't been able to have one since Tuesday.    Two nurses tried to put in her NGT without success.    She has had multiple surgeries including colectomy and repair of colovesical fistula.     Past Medical History:  Diagnosis Date  . Anemia    as child  . Barrett's esophagus   . Blood in urine    has symptoms of UTI- burning and aching lower abdomen  . Celiac disease    per Dr. Fuller Plan  . Colovesical fistula   . Complication of anesthesia 1966   adverse reaction to saddle block  . Degenerative joint disease    knees  . Diverticulitis    and diverticulosis in sigmoid colon   . Erosive esophagitis   . GERD (gastroesophageal reflux disease)   . Heart murmur    DOES NOT CAUSE ANY PROBLEMS  . Hiatal hernia   . Hypertension   . PONV (postoperative nausea and vomiting)   . Rheumatic fever    as child    Past Surgical History:  Procedure Laterality Date  . APPENDECTOMY    . BUNIONECTOMY Right   . CARPAL TUNNEL RELEASE Left   . CYSTOSCOPY W/ RETROGRADES Left 12/23/2013   Procedure: CYSTOSCOPY WITH LEFT RETROGRADE PYELOGRAM;  Surgeon: Ailene Rud, MD;  Location: [facility];  Service: Urology;  Laterality: Left;  . HYSTEROSCOPY W/D&C N/A 11/06/2013   Procedure: DILATATION AND CURETTAGE /HYSTEROSCOPY;   Surgeon: Daria Pastures, MD;  Location: [facility];  Service: Gynecology;  Laterality: N/A;  . KNEE ARTHROSCOPY Right   . LAPAROSCOPIC PARTIAL COLECTOMY N/A 02/04/2014   Procedure: LAPAROSCOPIC PARTIAL COLECTOMY;  Surgeon: Pedro Earls, MD;  Location: [facility];  Service: General;  Laterality: N/A;  . TAKE DOWN OF INTESTINAL FISTULA N/A 02/04/2014   Procedure: TAKE DOWN OF COLOVESICAL FISTULA;  Surgeon: Pedro Earls, MD;  Location: [facility];  Service: General;  Laterality: N/A;  . TONSILLECTOMY     x2  . TRANSURETHRAL RESECTION OF BLADDER TUMOR N/A 12/23/2013   Procedure: TRANSURETHRAL RESECTION OF BLADDER TUMOR (TURBT);  Surgeon: Ailene Rud, MD;  Location: [facility];  Service: Urology;  Laterality: N/A;  . VESICO-VAGINAL FISTULA REPAIR N/A 02/04/2014   Procedure: COLOVESICAL FISTULA REPAIR ;  Surgeon: Ailene Rud, MD;  Location: [facility];  Service: Urology;  Laterality: N/A;    Family History  Problem Relation Age of Onset  . Breast cancer Sister   . Heart disease Father   . Stroke Mother        multiple  . Diverticulitis Sister        colon surgery  . Diverticulitis Brother         colon surgery  .  Colon cancer Neg Hx   . Esophageal cancer Neg Hx   . Rectal cancer Neg Hx   . Stomach cancer Neg Hx     Social History:  reports that she quit smoking about 51 years ago. Her smoking use included cigarettes. She has never used smokeless tobacco. She reports that she does not drink alcohol or use drugs.  Allergies:  Allergies  Allergen Reactions  . Gluten Meal     Diarrhea, vomiting that lasts 3-4 hours  . Ace Inhibitors     Coughing  . Ciprofloxacin     Joint pain  . Ibuprofen     Joint pain  . Levofloxacin     Pain in tendons  . Simvastatin Other (See Comments)    Muscle pain    Medications:  Current Meds  Medication Sig  . amLODipine (NORVASC) 5 MG tablet Take 5 mg by mouth daily.   . cycloSPORINE (RESTASIS) 0.05 % ophthalmic emulsion Place 1 drop into  both eyes 2 (two) times daily.  Marland Kitchen losartan (COZAAR) 100 MG tablet Take 100 mg by mouth daily.   . metoprolol succinate (TOPROL-XL) 50 MG 24 hr tablet Take 50 mg by mouth every evening.   Marland Kitchen omeprazole (PRILOSEC) 40 MG capsule Take 40 mg by mouth daily.     Results for orders placed or performed during the hospital encounter of 04/25/18 (from the past 48 hour(s))  Lipase, blood     Status: None   Collection Time: 04/25/18  8:30 PM  Result Value Ref Range   Lipase 23 11 - 51 U/L    Comment: Performed at [facility], [place] [address], [place], [place] [address]  Comprehensive metabolic panel     Status: Abnormal   Collection Time: 04/25/18  8:30 PM  Result Value Ref Range   Sodium 137 135 - 145 mmol/L   Potassium 3.7 3.5 - 5.1 mmol/L   Chloride 101 98 - 111 mmol/L   CO2 23 22 - 32 mmol/L   Glucose, Bld 185 (H) 70 - 99 mg/dL   BUN 20 8 - 23 mg/dL   Creatinine, Ser 0.95 0.44 - 1.00 mg/dL   Calcium 9.3 8.9 - 10.3 mg/dL   Total Protein 6.7 6.5 - 8.1 g/dL   Albumin 4.0 3.5 - 5.0 g/dL   AST 23 15 - 41 U/L   ALT 15 0 - 44 U/L   Alkaline Phosphatase 113 38 - 126 U/L   Total Bilirubin 1.1 0.3 - 1.2 mg/dL   GFR calc non Af Amer 57 (L) >60 mL/min   GFR calc Af Amer >60 >60 mL/min   Anion gap 13 5 - 15    Comment: Performed at [facility] [address], [place], [place] [address]  CBC     Status: Abnormal   Collection Time: 04/25/18  8:30 PM  Result Value Ref Range   WBC 11.0 (H) 4.0 - 10.5 K/uL   RBC 4.52 3.87 - 5.11 MIL/uL   Hemoglobin 13.9 12.0 - 15.0 g/dL   HCT 41.0 36.0 - 46.0 %   MCV 90.7 80.0 - 100.0 fL   MCH 30.8 26.0 - 34.0 pg   MCHC 33.9 30.0 - 36.0 g/dL   RDW 14.0 11.5 - 15.5 %   Platelets 332 150 - 400 K/uL   nRBC 0.0 0.0 - 0.2 %    Comment: Performed at [facility], [place] [address] [place], [place] [address]  Lactic acid, plasma  Status: None   Collection Time: 04/26/18 12:40 AM  Result Value Ref Range   Lactic Acid, Venous 1.4 0.5 - 1.9 mmol/L     Comment: Performed at [facility], [place] [address] [place], [place] [address]  Troponin I - Add-On to previous collection     Status: None   Collection Time: 04/26/18 12:41 AM  Result Value Ref Range   Troponin I <0.03 <0.03 ng/mL    Comment: Performed at [facility] [address], [place], [place] [address]    Ct Abdomen Pelvis W Contrast  Result Date: 04/26/2018 CLINICAL DATA:  Upper abdominal pain with vomiting EXAM: CT ABDOMEN AND PELVIS WITH CONTRAST TECHNIQUE: Multidetector CT imaging of the abdomen and pelvis was performed using the standard protocol following bolus administration of intravenous contrast. CONTRAST:  170m OMNIPAQUE 300 COMPARISON:  12/12/2013 FINDINGS: Lower chest: No acute abnormality. Hepatobiliary: No focal liver abnormality is seen. No gallstones, gallbladder wall thickening, or biliary dilatation. Pancreas: Unremarkable. No pancreatic ductal dilatation or surrounding inflammatory changes. Spleen: Normal in size without focal abnormality. Adrenals/Urinary Tract: Adrenal glands are within normal limits. Kidneys are well visualized bilaterally. No renal calculi or obstructive changes are seen. Cystic lesion is noted in the right kidney stable from the prior study. Normal excretion of contrast is seen. The bladder is decompressed. Stomach/Bowel: Postsurgical changes are noted in the rectosigmoid region. No obstructive or inflammatory changes of the colon are noted. The appendix has been surgically removed. Mildly prominent fluid-filled loops of jejunum and likely proximal ileum are noted. Some fecalization of bowel contents is noted in the right lower quadrant with relatively normal small bowel noted distally. A transition zone is seen in the right lower quadrant best seen on image number 34 of series 6. No mass lesion is noted in this region although the bowel loops are somewhat intimately apposed to the anterior abdominal wall likely representing some  adhesions. Vascular/Lymphatic: Aortic atherosclerosis. No enlarged abdominal or pelvic lymph nodes. Reproductive: Uterus and bilateral adnexa are unremarkable. Other: No abdominal wall hernia or abnormality. No abdominopelvic ascites. Musculoskeletal: Degenerative changes of lumbar spine are noted. IMPRESSION: Mildly prominent loops of jejunum and proximal ileum with evidence of fecalization of small bowel contents and abrupt caliber change in the right lower quadrant consistent with partial small bowel obstruction likely related to adhesions. No other focal abnormality is noted. Electronically Signed   By: MInez CatalinaM.D.   On: 04/26/2018 01:44   Dg Abdomen Acute W/chest  Result Date: 04/26/2018 CLINICAL DATA:  78y/o  F; abdominal pain and vomiting. EXAM: DG ABDOMEN ACUTE W/ 1V CHEST COMPARISON:  02/04/2014 abdomen radiographs. FINDINGS: There is no evidence of dilated bowel loops or free intraperitoneal air. No radiopaque calculi or other significant radiographic abnormality is seen. Heart size and mediastinal contours are within normal limits. Both lungs are clear. Moderate S-shaped curvature of the spine. Aortic calcific atherosclerosis. Surgical sutures project over the pelvis. IMPRESSION: Normal bowel gas pattern.  No acute cardiopulmonary disease. Electronically Signed   By: LKristine GarbeM.D.   On: 04/26/2018 00:33    Review of Systems  Constitutional: Negative.   HENT: Negative.   Eyes: Negative.   Respiratory: Negative.   Cardiovascular: Negative.   Gastrointestinal: Positive for abdominal pain, nausea and vomiting.  Genitourinary: Negative.   Musculoskeletal: Negative.   Skin: Negative.   Neurological: Negative.   Endo/Heme/Allergies: Negative.   Psychiatric/Behavioral: Negative.   All other systems reviewed and are negative.  Blood pressure (!) 155/76, pulse (!) 109, temperature 98.3 F (36.8 C), temperature source Oral, resp. rate 16, SpO2 97 %.   Physical Exam   Constitutional: She is oriented to person, place, and time. She appears well-developed and well-nourished. No distress.  HENT:  Head: Normocephalic and atraumatic.  Right Ear: External ear normal.  Left Ear: External ear normal.  Eyes: Pupils are equal, round, and reactive to light. Conjunctivae are normal. No scleral icterus.  Neck: Normal range of motion. Neck supple. No thyromegaly present.  Cardiovascular: Normal rate and regular rhythm.  Respiratory: Effort normal. She exhibits no tenderness.  GI: Soft. She exhibits distension. Mass: mild voluntary guarding  There is abdominal tenderness (diffuse mild tenderness). There is guarding. There is no rebound.  Musculoskeletal: Normal range of motion.        General: No tenderness, deformity or edema.  Neurological: She is alert and oriented to person, place, and time. Coordination normal.  Skin: Skin is warm and dry. No rash noted. She is not diaphoretic. No erythema. No pallor.  Psychiatric: She has a normal mood and affect. Her behavior is normal. Judgment and thought content normal.    Assessment/Plan: SBO History of diverticulitis and colovesical fistula. Has had appy as well.    Will have day staff attempt NGT and if unsuccessful, will ask fluoro. Will start small bowel protocol. Hopefully she will be able to get by without surgical intervention.    Stark Klein 04/26/2018, 2:42 AM

## 2018-04-26 NOTE — H&P (Signed)
History and Physical    PLEASE NOTE THAT DRAGON DICTATION SOFTWARE WAS USED IN THE CONSTRUCTION OF THIS NOTE.   [name] VXB:[id number] DOB: 03-14-1940 DOA: 04/25/2018  PCP: Cari Caraway, MD Patient coming from: Home  I have personally briefly reviewed patient's old medical records in [place]  Chief Complaint: Abdominal pain  HPI: [name] is a 78 y.o. female with medical history significant for colovesicular fistula status post surgical repair, diverticulitis with abscess status post partial colectomy in 2016, hypertension, who is admitted to [facility] on 04/25/2018 with partial small bowel obstruction after presenting from home to [facility] emergency department complaining of abdominal pain.  The patient reports 1 day of progressive abdominal discomfort, which she describes as sharp in nature.  Reports distribution of abdominal discomfort is diffuse, but most severe in the right lower abdominal quadrant.  She reports associated nausea resulting in 2-3 episodes of nonbloody, nonbilious emesis.  She also reports diminished flatus production over the last day, and most recent movement occurring greater than 24 hours ago, which she states is unusual for her given her baseline daily bowel movements.  Denies any preceding trauma.  Denies any subjective fever, chills, rigors, or generalized myalgias.  Denies any associated chest pain, shortness of breath, palpitations, or diaphoresis.  Denies any known or suspected COVID-19 exposures.   She reports multiple prior abdominal surgeries, including surgical repair of colovesicular fistula as well as partial colectomy in the setting of diverticulitis with abscess.  She denies any prior history of small bowel obstruction.   ED Course: Vital signs in the ED were notable for the following: Temperature max 98.3; heart rate 77-78; blood pressure ranged from 155/76 - 169/79; respiratory rate 16, and oxygen saturation 97 100% on  room air.  Labs in the ED were notable for the following: CMP notable for sodium 137, bicarbonate 23, creatinine 1.97, and liver enzymes are found to be within normal limits.  Troponin x1-.  CBC notable for white blood cell count of 11,000.  Lactic acid 1.4.  CT of the abdomen/pelvis with IV contrast, per final radiology report showed prominent loops of jejunum and proximal ileus with evidence of transition point in the right lower quadrant consistent with partial bowel obstruction.  The emergency department physician, Dr. Wyvonnia Dusky, discussed the patient's case and imaging with the on-call general surgeon, Dr. Barry Dienes, who agreed that presentation and imaging were suggestive of partial small bowel obstruction. Dr. Barry Dienes recommended initiation of conservative measures for management of such, including n.p.o., IV fluids, PRN IV pain control, PRN IV antiemetics, and placement of NG tube.   While in the ED, the following were administered: Fentanyl 25 mcg IV x1, Zofran 4 mg IV x1, and IV normal saline x2 L bolus.  Subsequently, the patient was admitted to the [place] floor for further evaluation and management of presenting partial small bowel obstruction.    Review of Systems: As per HPI otherwise 10 point review of systems negative.   Past Medical History:  Diagnosis Date   Anemia    as child   Barrett's esophagus    Blood in urine    has symptoms of UTI- burning and aching lower abdomen   Celiac disease    per Dr. Fuller Plan   Colovesical fistula    Complication of anesthesia 1966   adverse reaction to saddle block   Degenerative joint disease    knees   Diverticulitis    and diverticulosis in sigmoid colon  Erosive esophagitis    GERD (gastroesophageal reflux disease)    Heart murmur    DOES NOT CAUSE ANY PROBLEMS   Hiatal hernia    Hypertension    PONV (postoperative nausea and vomiting)    Rheumatic fever    as child    Past Surgical History:  Procedure  Laterality Date   APPENDECTOMY     BUNIONECTOMY Right    CARPAL TUNNEL RELEASE Left    CYSTOSCOPY W/ RETROGRADES Left 12/23/2013   Procedure: CYSTOSCOPY WITH LEFT RETROGRADE PYELOGRAM;  Surgeon: Ailene Rud, MD;  Location: [facility];  Service: Urology;  Laterality: Left;   HYSTEROSCOPY W/D&C N/A 11/06/2013   Procedure: DILATATION AND CURETTAGE /HYSTEROSCOPY;  Surgeon: Daria Pastures, MD;  Location: [place] [facility];  Service: Gynecology;  Laterality: N/A;   KNEE ARTHROSCOPY Right    LAPAROSCOPIC PARTIAL COLECTOMY N/A 02/04/2014   Procedure: LAPAROSCOPIC PARTIAL COLECTOMY;  Surgeon: Pedro Earls, MD;  Location: [facility];  Service: General;  Laterality: N/A;   TAKE DOWN OF INTESTINAL FISTULA N/A 02/04/2014   Procedure: TAKE DOWN OF COLOVESICAL FISTULA;  Surgeon: Pedro Earls, MD;  Location: [facility];  Service: General;  Laterality: N/A;   TONSILLECTOMY     x2   TRANSURETHRAL RESECTION OF BLADDER TUMOR N/A 12/23/2013   Procedure: TRANSURETHRAL RESECTION OF BLADDER TUMOR (TURBT);  Surgeon: Ailene Rud, MD;  Location: [facility];  Service: Urology;  Laterality: N/A;   VESICO-VAGINAL FISTULA REPAIR N/A 02/04/2014   Procedure: COLOVESICAL FISTULA REPAIR ;  Surgeon: Ailene Rud, MD;  Location: [facility];  Service: Urology;  Laterality: N/A;    Social History:  reports that she quit smoking about 51 years ago. Her smoking use included cigarettes. She has never used smokeless tobacco. She reports that she does not drink alcohol or use drugs.   Allergies  Allergen Reactions   Gluten Meal     Diarrhea, vomiting that lasts 3-4 hours   Ace Inhibitors     Coughing   Ciprofloxacin     Joint pain   Ibuprofen     Joint pain   Levofloxacin     Pain in tendons   Simvastatin Other (See Comments)    Muscle pain    Family History  Problem Relation Age of Onset   Breast cancer Sister    Heart disease Father    Stroke Mother        multiple   Diverticulitis  Sister        colon surgery   Diverticulitis Brother         colon surgery   Colon cancer Neg Hx    Esophageal cancer Neg Hx    Rectal cancer Neg Hx    Stomach cancer Neg Hx      Prior to Admission medications   Medication Sig Start Date End Date Taking? Authorizing Provider  amLODipine (NORVASC) 5 MG tablet Take 5 mg by mouth daily.    Yes [provider]  cycloSPORINE (RESTASIS) 0.05 % ophthalmic emulsion Place 1 drop into both eyes 2 (two) times daily.   Yes [provider]  losartan (COZAAR) 100 MG tablet Take 100 mg by mouth daily.    Yes [provider]  metoprolol succinate (TOPROL-XL) 50 MG 24 hr tablet Take 50 mg by mouth every evening.    Yes [provider]  omeprazole (PRILOSEC) 40 MG capsule Take 40 mg by mouth daily. 03/24/18  Yes [provider]  HYDROcodone-acetaminophen (NORCO) 5-325 MG per tablet  Take 1 tablet by mouth every 4 (four) hours as needed for moderate pain. Patient not taking: Reported on 04/26/2018 02/13/14   Johnathan Hausen, MD  ondansetron (ZOFRAN) 4 MG tablet Take 1 tablet (4 mg total) by mouth every 6 (six) hours. Patient not taking: Reported on 04/26/2018 02/03/14   Quintella Reichert, MD  phenazopyridine (PYRIDIUM) 200 MG tablet Take 1 tablet (200 mg total) by mouth 3 (three) times daily as needed for pain. Patient not taking: Reported on 04/26/2018 12/24/13   Carolan Clines, MD  trimethoprim (TRIMPEX) 100 MG tablet Take 1 tablet (100 mg total) by mouth 1 day or 1 dose. Patient not taking: Reported on 04/26/2018 12/24/13   Carolan Clines, MD     Objective     Physical Exam: Vitals:   04/25/18 2008 04/25/18 2010  BP:  (!) 155/76  Pulse: (!) 109   Resp: 16   Temp: 98.3 F (36.8 C)   TempSrc: Oral   SpO2: 97%     General: appears to be stated age; alert, oriented Skin: warm, dry, no rash Head:  AT/ Mouth:  Oral mucosa membranes appear dry, normal dentition Neck: supple; trachea  midline Heart:  RRR; did not appreciate any M/R/G Lungs: CTAB, did not appreciate any wheezes, rales, or rhonchi Abdomen: + BS; soft, mildly distended; diffuse tenderness to palpation, most prominent in the right lower quadrant in the absence of any associated guarding, rigidity, or rebound tenderness. Extremities: no peripheral edema, no muscle wasting   Labs on Admission: I have personally reviewed following labs and imaging studies  CBC: Recent Labs  Lab 04/25/18 2030  WBC 11.0*  HGB 13.9  HCT 41.0  MCV 90.7  PLT 277   Basic Metabolic Panel: Recent Labs  Lab 04/25/18 2030  NA 137  K 3.7  CL 101  CO2 23  GLUCOSE 185*  BUN 20  CREATININE 0.95  CALCIUM 9.3   GFR: CrCl cannot be calculated (Unknown ideal weight.). Liver Function Tests: Recent Labs  Lab 04/25/18 2030  AST 23  ALT 15  ALKPHOS 113  BILITOT 1.1  PROT 6.7  ALBUMIN 4.0   Recent Labs  Lab 04/25/18 2030  LIPASE 23   No results for input(s): AMMONIA in the last 168 hours. Coagulation Profile: No results for input(s): INR, PROTIME in the last 168 hours. Cardiac Enzymes: Recent Labs  Lab 04/26/18 0041  TROPONINI <0.03   BNP (last 3 results) No results for input(s): PROBNP in the last 8760 hours. HbA1C: No results for input(s): HGBA1C in the last 72 hours. CBG: No results for input(s): GLUCAP in the last 168 hours. Lipid Profile: No results for input(s): CHOL, HDL, LDLCALC, TRIG, CHOLHDL, LDLDIRECT in the last 72 hours. Thyroid Function Tests: No results for input(s): TSH, T4TOTAL, FREET4, T3FREE, THYROIDAB in the last 72 hours. Anemia Panel: No results for input(s): VITAMINB12, FOLATE, FERRITIN, TIBC, IRON, RETICCTPCT in the last 72 hours. Urine analysis:    Component Value Date/Time   COLORURINE YELLOW 02/04/2014 0016   APPEARANCEUR CLOUDY (A) 02/04/2014 0016   LABSPEC 1.013 02/04/2014 0016   PHURINE 5.5 02/04/2014 0016   GLUCOSEU NEGATIVE 02/04/2014 0016   HGBUR MODERATE (A)  02/04/2014 0016   BILIRUBINUR NEGATIVE 02/04/2014 0016   KETONESUR >80 (A) 02/04/2014 0016   PROTEINUR NEGATIVE 02/04/2014 0016   UROBILINOGEN 0.2 02/04/2014 0016   NITRITE NEGATIVE 02/04/2014 0016   LEUKOCYTESUR LARGE (A) 02/04/2014 0016    Radiological Exams on Admission: Ct Abdomen Pelvis W Contrast  Result Date: 04/26/2018  CLINICAL DATA:  Upper abdominal pain with vomiting EXAM: CT ABDOMEN AND PELVIS WITH CONTRAST TECHNIQUE: Multidetector CT imaging of the abdomen and pelvis was performed using the standard protocol following bolus administration of intravenous contrast. CONTRAST:  137m OMNIPAQUE 300 COMPARISON:  12/12/2013 FINDINGS: Lower chest: No acute abnormality. Hepatobiliary: No focal liver abnormality is seen. No gallstones, gallbladder wall thickening, or biliary dilatation. Pancreas: Unremarkable. No pancreatic ductal dilatation or surrounding inflammatory changes. Spleen: Normal in size without focal abnormality. Adrenals/Urinary Tract: Adrenal glands are within normal limits. Kidneys are well visualized bilaterally. No renal calculi or obstructive changes are seen. Cystic lesion is noted in the right kidney stable from the prior study. Normal excretion of contrast is seen. The bladder is decompressed. Stomach/Bowel: Postsurgical changes are noted in the rectosigmoid region. No obstructive or inflammatory changes of the colon are noted. The appendix has been surgically removed. Mildly prominent fluid-filled loops of jejunum and likely proximal ileum are noted. Some fecalization of bowel contents is noted in the right lower quadrant with relatively normal small bowel noted distally. A transition zone is seen in the right lower quadrant best seen on image number 34 of series 6. No mass lesion is noted in this region although the bowel loops are somewhat intimately apposed to the anterior abdominal wall likely representing some adhesions. Vascular/Lymphatic: Aortic atherosclerosis. No  enlarged abdominal or pelvic lymph nodes. Reproductive: Uterus and bilateral adnexa are unremarkable. Other: No abdominal wall hernia or abnormality. No abdominopelvic ascites. Musculoskeletal: Degenerative changes of lumbar spine are noted. IMPRESSION: Mildly prominent loops of jejunum and proximal ileum with evidence of fecalization of small bowel contents and abrupt caliber change in the right lower quadrant consistent with partial small bowel obstruction likely related to adhesions. No other focal abnormality is noted. Electronically Signed   By: MInez CatalinaM.D.   On: 04/26/2018 01:44   Dg Abdomen Acute W/chest  Result Date: 04/26/2018 CLINICAL DATA:  78y/o  F; abdominal pain and vomiting. EXAM: DG ABDOMEN ACUTE W/ 1V CHEST COMPARISON:  02/04/2014 abdomen radiographs. FINDINGS: There is no evidence of dilated bowel loops or free intraperitoneal air. No radiopaque calculi or other significant radiographic abnormality is seen. Heart size and mediastinal contours are within normal limits. Both lungs are clear. Moderate S-shaped curvature of the spine. Aortic calcific atherosclerosis. Surgical sutures project over the pelvis. IMPRESSION: Normal bowel gas pattern.  No acute cardiopulmonary disease. Electronically Signed   By: LKristine GarbeM.D.   On: 04/26/2018 00:33      Assessment/Plan   MCHASE KNEBELis a 78y.o. female with medical history significant for colovesicular fistula status post surgical repair, diverticulitis with abscess status post partial colectomy in 2016, hypertension, [facility]   Principal Problem:   SBO (small bowel obstruction) (HCC) Active Problems:   HTN (hypertension)   Abdominal pain   Nausea & vomiting   Acute prerenal azotemia   Leukocytosis   #) Partial small bowel obstruction:  Diagnosis on the basis of presenting 1 day of abdominal discomfort associated with nausea, vomiting, and diminished flatus production, with CT abdomen/pelvis demonstrating evidence of prominent loops of jejunum and proximal ileum with evidence of transition point in the right lower quadrant consistent with partial bowel obstruction.  It appears to be on the basis of mechanical etiology secondary to postoperative adhesions in the context of multiple  prior abdominal surgeries, as further outlined above.  CT abdomen/pelvis reveals no evidence of abscess or bowel perforation.  Case and imaging were discussed with the on-call general surgeon, Dr. Barry Dienes, who recommends initiation of conservative measures, as above.   Plan: Strict n.p.o.  IV normal saline at 100 cc/h.  PRN IV Zofran, PRN IV fentanyl.  Placement of NG tube attached to low, intermittent wall suction.  Monitor strict I's and O's.  Repeat BMP in the morning.    #) Leukocytosis: Mildly elevated white blood cell count of 11,000 per presenting CBC.  I suspect that this is on the basis of inflammation stemming from presenting small bowel structure as well as element of dehydration given diminished oral intake as well as increase in GI losses via multiple episodes of vomiting over the last day.  Patient is afebrile, and demonstrates no additional clinical evidence to suggest underlying infectious process at this time.  Plan: Check urinalysis.  IV fluids.  Work-up and management of presenting partial small bowel obstruction.  Will monitor closely for subsequent development of fever.  Repeat CBC in the morning.     #) Prerenal azotemia: Secondary to dehydration in the setting of increased GI losses due to nausea/vomiting over the last day, as further outlined above.  Plan: IV fluids as above.  Check urinalysis.  Monitor strict I's and O's.  Repeat BMP in the morning.     #) Essential hypertension: Outpatient antihypertensive regimen consists of  Norvasc, losartan, and Toprol-XL.  Presenting systolic blood pressures noted to be in the range of 150's to 160's mmHg, with a suspected contribution due to abdominal discomfort associated with presenting small bowel obstruction.  Plan: We will hold home antihypertensives for now in the setting of strict n.p.o. status as employment of conservative measures in context of presenting small obstruction.  Will closely monitor ensuing blood pressures via routine vital signs.     #) GERD: On omeprazole as an outpatient.  Plan: In the setting of current strict n.p.o. status, will hold home omeprazole.  I have ordered daily IV Protonix in its stead.    DVT prophylaxis: scd's Code Status: full  Family Communication: none Disposition Plan: none Per Rounding Team Consults called: Dr. Barry Dienes of general surgery. Admission status: Observation; MedSurg    PLEASE NOTE THAT DRAGON DICTATION SOFTWARE WAS USED IN THE CONSTRUCTION OF THIS NOTE.   [facility] Pager [phone] From [place].

## 2018-04-26 NOTE — Progress Notes (Signed)
Patient NG tube flushing well after pulling 2 cms out as per [facility] PA, retaped.

## 2018-04-26 NOTE — ED Provider Notes (Signed)
[facility] Provider Note   CSN: [id number] Arrival date & time: 04/25/18  1949    History   Chief Complaint Chief Complaint  Patient presents with   Abdominal Pain   Emesis    HPI [name] is a 78 y.o. female.     Patient with history of celiac disease, colovesical fistula status post surgical repair, previous appendectomy presenting with upper abdominal pain, nausea and vomiting onset yesterday around 1 PM.  States she developed upper abdominal pain around 1 PM shortly after eating some peanuts.  She do not eat or drink anything else throughout the day.  About 1 AM she developed nausea and vomiting that was dark green in color and has vomited about 4-5 times since not able to keep anything down today.  States she had 2 bowel movements that were normal and no diarrhea.  No blood in her stool.  Her abdominal pain is since moved to her lower abdomen and has become sharp and constant.  She does not try to eat or drink anything today.  No known fever or chills.  No pain with urination or blood in the urine.  No chest pain or shortness of breath.  The history is provided by the patient.  Abdominal Pain  Associated symptoms: nausea and vomiting   Associated symptoms: no cough, no diarrhea, no dysuria, no hematuria and no shortness of breath   Emesis  Associated symptoms: abdominal pain   Associated symptoms: no arthralgias, no cough, no diarrhea, no headaches and no myalgias     Past Medical History:  Diagnosis Date   Anemia    as child   Barrett's esophagus    Blood in urine    has symptoms of UTI- burning and aching lower abdomen   Celiac disease    per Dr. Fuller Plan   Colovesical fistula    Complication of anesthesia 1966   adverse reaction to saddle block   Degenerative joint disease    knees   Diverticulitis    and diverticulosis in sigmoid colon    Erosive esophagitis    GERD (gastroesophageal reflux disease)     Heart murmur    DOES NOT CAUSE ANY PROBLEMS   Hiatal hernia    Hypertension    PONV (postoperative nausea and vomiting)    Rheumatic fever    as child    Patient Active Problem List   Diagnosis Date Noted   Colovesical fistula 02/04/2014   Mass of urinary bladder 12/23/2013   Uterine polyp 08/06/2013   Unspecified constipation 08/05/2013   Thickened endometrium incidentally noted on CT 08/05/2013   Diverticulitis of large intestine with abscess without bleeding 08/04/2013   HTN (hypertension) 08/04/2013   Ovarian cystic mass 02/01/2011   Tubo-ovarian abscess 02/01/2011   Hyponatremia 02/01/2011   Hypokalemia 02/01/2011   Barrett's esophagus 12/17/2008   DIVERTICULITIS OF COLON 12/17/2008   NONSPECIFIC ABN FINDING RAD & OTH EXAM GI TRACT 12/17/2008    Past Surgical History:  Procedure Laterality Date   APPENDECTOMY     BUNIONECTOMY Right    CARPAL TUNNEL RELEASE Left    CYSTOSCOPY W/ RETROGRADES Left 12/23/2013   Procedure: CYSTOSCOPY WITH LEFT RETROGRADE PYELOGRAM;  Surgeon: Ailene Rud, MD;  Location: [facility];  Service: Urology;  Laterality: Left;   HYSTEROSCOPY W/D&C N/A 11/06/2013   Procedure: DILATATION AND CURETTAGE /HYSTEROSCOPY;  Surgeon: Daria Pastures, MD;  Location: [facility];  Service: Gynecology;  Laterality: N/A;   KNEE ARTHROSCOPY Right  LAPAROSCOPIC PARTIAL COLECTOMY N/A 02/04/2014   Procedure: LAPAROSCOPIC PARTIAL COLECTOMY;  Surgeon: Pedro Earls, MD;  Location: [facility];  Service: General;  Laterality: N/A;   TAKE DOWN OF INTESTINAL FISTULA N/A 02/04/2014   Procedure: TAKE DOWN OF COLOVESICAL FISTULA;  Surgeon: Pedro Earls, MD;  Location: [facility];  Service: General;  Laterality: N/A;   TONSILLECTOMY     x2   TRANSURETHRAL RESECTION OF BLADDER TUMOR N/A 12/23/2013   Procedure: TRANSURETHRAL RESECTION OF BLADDER TUMOR (TURBT);  Surgeon: Ailene Rud, MD;  Location: [facility];  Service: Urology;  Laterality:  N/A;   VESICO-VAGINAL FISTULA REPAIR N/A 02/04/2014   Procedure: COLOVESICAL FISTULA REPAIR ;  Surgeon: Ailene Rud, MD;  Location: [facility];  Service: Urology;  Laterality: N/A;     OB History   No obstetric history on file.      Home Medications    Prior to Admission medications   Medication Sig Start Date End Date Taking? Authorizing Provider  acetaminophen (TYLENOL) 500 MG tablet Take 500 mg by mouth every 6 (six) hours as needed for moderate pain (pain).    [provider]  acidophilus (RISAQUAD) CAPS Take 1 capsule by mouth every evening.     [provider]  amLODipine (NORVASC) 5 MG tablet Take 5 mg by mouth daily.     [provider]  Cholecalciferol (VITAMIN D3) 2000 UNITS TABS Take 1 tablet by mouth daily at 12 noon.     [provider]  cycloSPORINE (RESTASIS) 0.05 % ophthalmic emulsion Place 1 drop into both eyes 2 (two) times daily.    [provider]  HYDROcodone-acetaminophen (NORCO) 5-325 MG per tablet Take 1 tablet by mouth every 4 (four) hours as needed for moderate pain. 02/13/14   Johnathan Hausen, MD  losartan (COZAAR) 100 MG tablet Take 100 mg by mouth every morning.     [provider]  metoprolol succinate (TOPROL-XL) 50 MG 24 hr tablet Take 50 mg by mouth every evening.     [provider]  metroNIDAZOLE (FLAGYL) 500 MG tablet Take 500 mg by mouth 3 (three) times daily.    [provider]  ondansetron (ZOFRAN) 4 MG tablet Take 1 tablet (4 mg total) by mouth every 6 (six) hours. 02/03/14   Quintella Reichert, MD  pantoprazole (PROTONIX) 40 MG tablet Take 40 mg by mouth 2 (two) times daily.    [provider]  phenazopyridine (PYRIDIUM) 200 MG tablet Take 1 tablet (200 mg total) by mouth 3 (three) times daily as needed for pain. 12/24/13   Carolan Clines, MD  trimethoprim (TRIMPEX) 100 MG tablet Take 1 tablet (100 mg total) by mouth 1 day or 1 dose. 12/24/13   Carolan Clines,  MD  vitamin B-12 (CYANOCOBALAMIN) 1000 MCG tablet Take 1,000 mcg by mouth daily at 12 noon.     [provider]    Family History Family History  Problem Relation Age of Onset   Breast cancer Sister    Heart disease Father    Stroke Mother        multiple   Diverticulitis Sister        colon surgery   Diverticulitis Brother         colon surgery   Colon cancer Neg Hx    Esophageal cancer Neg Hx    Rectal cancer Neg Hx    Stomach cancer Neg Hx     Social History Social History   Tobacco Use   Smoking  status: Former Smoker    Types: Cigarettes    Last attempt to quit: 03/18/1967    Years since quitting: 51.1   Smokeless tobacco: Never Used  Substance Use Topics   Alcohol use: No   Drug use: No     Allergies   Gluten meal; Ace inhibitors; Ciprofloxacin; Ibuprofen; Levofloxacin; and Simvastatin   Review of Systems Review of Systems  Constitutional: Positive for activity change and appetite change.  HENT: Negative for congestion.   Eyes: Negative for pain and visual disturbance.  Respiratory: Negative for cough, chest tightness and shortness of breath.   Gastrointestinal: Positive for abdominal pain, nausea and vomiting. Negative for diarrhea.  Genitourinary: Negative for dysuria and hematuria.  Musculoskeletal: Negative for arthralgias, back pain and myalgias.  Skin: Negative for rash.  Neurological: Negative for dizziness, weakness and headaches.    all other systems are negative except as noted in the HPI and PMH.    Physical Exam Updated Vital Signs BP (!) 155/76    Pulse (!) 109    Temp 98.3 F (36.8 C) (Oral)    Resp 16    SpO2 97%   Physical Exam Vitals signs and nursing note reviewed.  Constitutional:      General: She is not in acute distress.    Appearance: She is well-developed.  HENT:     Head: Normocephalic and atraumatic.     Mouth/Throat:     Pharynx: No oropharyngeal exudate.  Eyes:     Conjunctiva/sclera:  Conjunctivae normal.     Pupils: Pupils are equal, round, and reactive to light.  Neck:     Musculoskeletal: Normal range of motion and neck supple.     Comments: No meningismus. Cardiovascular:     Rate and Rhythm: Normal rate and regular rhythm.     Heart sounds: Normal heart sounds. No murmur.  Pulmonary:     Effort: Pulmonary effort is normal. No respiratory distress.     Breath sounds: Normal breath sounds.  Abdominal:     Palpations: Abdomen is soft.     Tenderness: There is abdominal tenderness. There is guarding. There is no rebound.     Comments: Mild diffuse tenderness, worse periumbilically with voluntary guarding.  Musculoskeletal: Normal range of motion.        General: No tenderness.  Skin:    General: Skin is warm.     Capillary Refill: Capillary refill takes less than 2 seconds.  Neurological:     General: No focal deficit present.     Mental Status: She is alert and oriented to person, place, and time.     Cranial Nerves: No cranial nerve deficit.     Motor: No abnormal muscle tone.     Coordination: Coordination normal.     Comments: No ataxia on finger to nose bilaterally. No pronator drift. 5/5 strength throughout. CN 2-12 intact.Equal grip strength. Sensation intact.   Psychiatric:        Behavior: Behavior normal.      ED Treatments / Results  Labs (all labs ordered are listed, but only abnormal results are displayed) Labs Reviewed  COMPREHENSIVE METABOLIC PANEL - Abnormal; Notable for the following components:      Result Value   Glucose, Bld 185 (*)    GFR calc non Af Amer 57 (*)    All other components within normal limits  CBC - Abnormal; Notable for the following components:   WBC 11.0 (*)    All other components within normal limits  BASIC  METABOLIC PANEL - Abnormal; Notable for the following components:   CO2 20 (*)    Glucose, Bld 127 (*)    Calcium 8.6 (*)    All other components within normal limits  LIPASE, BLOOD  TROPONIN I  LACTIC  ACID, PLASMA  MAGNESIUM  CBC  URINALYSIS, ROUTINE W REFLEX MICROSCOPIC  URINALYSIS, ROUTINE W REFLEX MICROSCOPIC    EKG EKG Interpretation  Date/Time:  Wednesday April 25 2018 19:16:30 EDT Ventricular Rate:  100 PR Interval:  128 QRS Duration: 84 QT Interval:  362 QTC Calculation: 466 R Axis:   53 Text Interpretation:  Normal sinus rhythm Nonspecific ST abnormality Abnormal ECG No significant change was found Confirmed by Ezequiel Essex [phone]) on 04/25/2018 11:31:58 PM   Radiology Ct Abdomen Pelvis W Contrast  Result Date: 04/26/2018 CLINICAL DATA:  Upper abdominal pain with vomiting EXAM: CT ABDOMEN AND PELVIS WITH CONTRAST TECHNIQUE: Multidetector CT imaging of the abdomen and pelvis was performed using the standard protocol following bolus administration of intravenous contrast. CONTRAST:  137m OMNIPAQUE 300 COMPARISON:  12/12/2013 FINDINGS: Lower chest: No acute abnormality. Hepatobiliary: No focal liver abnormality is seen. No gallstones, gallbladder wall thickening, or biliary dilatation. Pancreas: Unremarkable. No pancreatic ductal dilatation or surrounding inflammatory changes. Spleen: Normal in size without focal abnormality. Adrenals/Urinary Tract: Adrenal glands are within normal limits. Kidneys are well visualized bilaterally. No renal calculi or obstructive changes are seen. Cystic lesion is noted in the right kidney stable from the prior study. Normal excretion of contrast is seen. The bladder is decompressed. Stomach/Bowel: Postsurgical changes are noted in the rectosigmoid region. No obstructive or inflammatory changes of the colon are noted. The appendix has been surgically removed. Mildly prominent fluid-filled loops of jejunum and likely proximal ileum are noted. Some fecalization of bowel contents is noted in the right lower quadrant with relatively normal small bowel noted distally. A transition zone is seen in the right lower quadrant best seen on image number 34 of  series 6. No mass lesion is noted in this region although the bowel loops are somewhat intimately apposed to the anterior abdominal wall likely representing some adhesions. Vascular/Lymphatic: Aortic atherosclerosis. No enlarged abdominal or pelvic lymph nodes. Reproductive: Uterus and bilateral adnexa are unremarkable. Other: No abdominal wall hernia or abnormality. No abdominopelvic ascites. Musculoskeletal: Degenerative changes of lumbar spine are noted. IMPRESSION: Mildly prominent loops of jejunum and proximal ileum with evidence of fecalization of small bowel contents and abrupt caliber change in the right lower quadrant consistent with partial small bowel obstruction likely related to adhesions. No other focal abnormality is noted. Electronically Signed   By: MInez CatalinaM.D.   On: 04/26/2018 01:44   Dg Abdomen Acute W/chest  Result Date: 04/26/2018 CLINICAL DATA:  78y/o  F; abdominal pain and vomiting. EXAM: DG ABDOMEN ACUTE W/ 1V CHEST COMPARISON:  02/04/2014 abdomen radiographs. FINDINGS: There is no evidence of dilated bowel loops or free intraperitoneal air. No radiopaque calculi or other significant radiographic abnormality is seen. Heart size and mediastinal contours are within normal limits. Both lungs are clear. Moderate S-shaped curvature of the spine. Aortic calcific atherosclerosis. Surgical sutures project over the pelvis. IMPRESSION: Normal bowel gas pattern.  No acute cardiopulmonary disease. Electronically Signed   By: LKristine GarbeM.D.   On: 04/26/2018 00:33    Procedures Procedures (including critical care time)  Medications Ordered in ED Medications  sodium chloride flush (NS) 0.9 % injection 3 mL (has no administration in time range)  sodium chloride 0.9 %  bolus 1,000 mL (has no administration in time range)  ondansetron (ZOFRAN) injection 4 mg (has no administration in time range)  fentaNYL (SUBLIMAZE) injection 25 mcg (has no administration in time range)      Initial Impression / Assessment and Plan / ED Course  I have reviewed the triage vital signs and the nursing notes.  Pertinent labs & imaging results that were available during my care of the patient were reviewed by me and considered in my medical decision making (see chart for details).       Patient with upper abdominal pain with nausea and vomiting, emesis has been greenish tinge.  Multiple previous abdominal surgeries.  Acute abdominal series is negative.  Labs are reassuring.  Mild leukocytosis.  Will obtain CT scan to further evaluate her severe lower abdominal pain with guarding.  CT scan consistent with small bowel obstruction due to adhesions.  Discussed with Dr. Barry Dienes of general surgery who will consult and agrees with NG tube, n.p.o. and IV fluids.  Admission discussed with Dr. Velia Meyer.  No further vomiting in the ED.  Patient's pain is improving.  She will be admitted for IV fluids, bowel rest and NG decompression. Final Clinical Impressions(s) / ED Diagnoses   Final diagnoses:  Small bowel obstruction [facility])    ED Discharge Orders    None       Ezequiel Essex, MD 04/26/18 [phone]

## 2018-04-26 NOTE — ED Notes (Signed)
ED TO INPATIENT HANDOFF REPORT  ED Nurse Name and Phone #: Tray [place], [place] Name/Age/Gender [name] 78 y.o. female Room/Bed: 025C/025C  Code Status   Code Status: Full Code  Home/SNF/Other Home Patient oriented to: self, place, time and situation Is this baseline? Yes   Triage Complete: Triage complete  Chief Complaint Stomach pain, vomiting  Triage Note Pt reports upper abd pain that started yesterday with vomiting. States she vomited all night long. Pt a.o, nad noted   Allergies Allergies  Allergen Reactions  . Gluten Meal     Diarrhea, vomiting that lasts 3-4 hours  . Ace Inhibitors     Coughing  . Ciprofloxacin     Joint pain  . Ibuprofen     Joint pain  . Levofloxacin     Pain in tendons  . Simvastatin Other (See Comments)    Muscle pain    Level of Care/Admitting Diagnosis ED Disposition    ED Disposition Condition Comment   Admit  Hospital Area: [facility] [[id number]]  Level of Care: Med-Surg [16]  I expect the patient will be discharged within 24 hours: Yes  LOW acuity---Tx typically complete <24 hrs---ACUTE conditions typically can be evaluated <24 hours---LABS likely to return to acceptable levels <24 hours---IS near functional baseline---EXPECTED to return to current living arrangement---NOT newly hypoxic: Meets criteria for 5C-Observation unit  Diagnosis: SBO (small bowel obstruction) [facility]) [[id number]]  Admitting Physician: Rhetta Mura [[id number]]  Attending Physician: Rhetta Mura [[id number]]  Possible Covid Disease Patient Isolation: N/A  PT Class (Do Not Modify): Observation [104]  PT Acc Code (Do Not Modify): Observation [10022]       B Medical/Surgery History Past Medical History:  Diagnosis Date  . Anemia    as child  . Barrett's esophagus   . Blood in urine    has symptoms of UTI- burning and aching lower abdomen  . Celiac disease    per Dr. Fuller Plan  . Colovesical fistula   . Complication of  anesthesia 1966   adverse reaction to saddle block  . Degenerative joint disease    knees  . Diverticulitis    and diverticulosis in sigmoid colon   . Erosive esophagitis   . GERD (gastroesophageal reflux disease)   . Heart murmur    DOES NOT CAUSE ANY PROBLEMS  . Hiatal hernia   . Hypertension   . PONV (postoperative nausea and vomiting)   . Rheumatic fever    as child   Past Surgical History:  Procedure Laterality Date  . APPENDECTOMY    . BUNIONECTOMY Right   . CARPAL TUNNEL RELEASE Left   . CYSTOSCOPY W/ RETROGRADES Left 12/23/2013   Procedure: CYSTOSCOPY WITH LEFT RETROGRADE PYELOGRAM;  Surgeon: Ailene Rud, MD;  Location: [facility];  Service: Urology;  Laterality: Left;  . HYSTEROSCOPY W/D&C N/A 11/06/2013   Procedure: DILATATION AND CURETTAGE /HYSTEROSCOPY;  Surgeon: Daria Pastures, MD;  Location: [facility];  Service: Gynecology;  Laterality: N/A;  . KNEE ARTHROSCOPY Right   . LAPAROSCOPIC PARTIAL COLECTOMY N/A 02/04/2014   Procedure: LAPAROSCOPIC PARTIAL COLECTOMY;  Surgeon: Pedro Earls, MD;  Location: [facility];  Service: General;  Laterality: N/A;  . TAKE DOWN OF INTESTINAL FISTULA N/A 02/04/2014   Procedure: TAKE DOWN OF COLOVESICAL FISTULA;  Surgeon: Pedro Earls, MD;  Location: [facility];  Service: General;  Laterality: N/A;  . TONSILLECTOMY     x2  . TRANSURETHRAL RESECTION OF BLADDER TUMOR N/A 12/23/2013  Procedure: TRANSURETHRAL RESECTION OF BLADDER TUMOR (TURBT);  Surgeon: Ailene Rud, MD;  Location: [facility];  Service: Urology;  Laterality: N/A;  . VESICO-VAGINAL FISTULA REPAIR N/A 02/04/2014   Procedure: COLOVESICAL FISTULA REPAIR ;  Surgeon: Ailene Rud, MD;  Location: [facility];  Service: Urology;  Laterality: N/A;     A IV Location/Drains/Wounds Patient Lines/Drains/Airways Status   Active Line/Drains/Airways    Name:   Placement date:   Placement time:   Site:   Days:   Peripheral IV 04/26/18 Right Antecubital   04/26/18    0059     Antecubital   less than 1   Urethral Catheter Byrd Hesselbach, RN  Latex   02/04/14    0755    Latex   1542   Incision (Closed) 11/06/13 Perineum Other (Comment)   11/06/13    1003     1632   Incision (Closed) 02/04/14 Abdomen Other (Comment)   02/04/14    1153     1542   Incision - 1 Port Abdomen 1: Left;Lateral   02/04/14    1154     1542          Intake/Output Last 24 hours No intake or output data in the 24 hours ending 04/26/18 0357  Labs/Imaging Results for orders placed or performed during the hospital encounter of 04/25/18 (from the past 48 hour(s))  Lipase, blood     Status: None   Collection Time: 04/25/18  8:30 PM  Result Value Ref Range   Lipase 23 11 - 51 U/L    Comment: Performed at [facility], [place] [address], [place], [place] [address]  Comprehensive metabolic panel     Status: Abnormal   Collection Time: 04/25/18  8:30 PM  Result Value Ref Range   Sodium 137 135 - 145 mmol/L   Potassium 3.7 3.5 - 5.1 mmol/L   Chloride 101 98 - 111 mmol/L   CO2 23 22 - 32 mmol/L   Glucose, Bld 185 (H) 70 - 99 mg/dL   BUN 20 8 - 23 mg/dL   Creatinine, Ser 0.95 0.44 - 1.00 mg/dL   Calcium 9.3 8.9 - 10.3 mg/dL   Total Protein 6.7 6.5 - 8.1 g/dL   Albumin 4.0 3.5 - 5.0 g/dL   AST 23 15 - 41 U/L   ALT 15 0 - 44 U/L   Alkaline Phosphatase 113 38 - 126 U/L   Total Bilirubin 1.1 0.3 - 1.2 mg/dL   GFR calc non Af Amer 57 (L) >60 mL/min   GFR calc Af Amer >60 >60 mL/min   Anion gap 13 5 - 15    Comment: Performed at [facility] [address], [place], [place] [address]  CBC     Status: Abnormal   Collection Time: 04/25/18  8:30 PM  Result Value Ref Range   WBC 11.0 (H) 4.0 - 10.5 K/uL   RBC 4.52 3.87 - 5.11 MIL/uL   Hemoglobin 13.9 12.0 - 15.0 g/dL   HCT 41.0 36.0 - 46.0 %   MCV 90.7 80.0 - 100.0 fL   MCH 30.8 26.0 - 34.0 pg   MCHC 33.9 30.0 - 36.0 g/dL   RDW 14.0 11.5 - 15.5 %   Platelets 332 150 - 400 K/uL   nRBC 0.0 0.0 - 0.2 %    Comment: Performed at [facility], [place] [address], [place], [place] [address]  Lactic acid, plasma     Status: None  Collection Time: 04/26/18 12:40 AM  Result Value Ref Range   Lactic Acid, Venous 1.4 0.5 - 1.9 mmol/L    Comment: Performed at [facility], [place] [address] [place], [place] [address]  Troponin I - Add-On to previous collection     Status: None   Collection Time: 04/26/18 12:41 AM  Result Value Ref Range   Troponin I <0.03 <0.03 ng/mL    Comment: Performed at [facility] [address], [place], [place] [address]   Ct Abdomen Pelvis W Contrast  Result Date: 04/26/2018 CLINICAL DATA:  Upper abdominal pain with vomiting EXAM: CT ABDOMEN AND PELVIS WITH CONTRAST TECHNIQUE: Multidetector CT imaging of the abdomen and pelvis was performed using the standard protocol following bolus administration of intravenous contrast. CONTRAST:  154m OMNIPAQUE 300 COMPARISON:  12/12/2013 FINDINGS: Lower chest: No acute abnormality. Hepatobiliary: No focal liver abnormality is seen. No gallstones, gallbladder wall thickening, or biliary dilatation. Pancreas: Unremarkable. No pancreatic ductal dilatation or surrounding inflammatory changes. Spleen: Normal in size without focal abnormality. Adrenals/Urinary Tract: Adrenal glands are within normal limits. Kidneys are well visualized bilaterally. No renal calculi or obstructive changes are seen. Cystic lesion is noted in the right kidney stable from the prior study. Normal excretion of contrast is seen. The bladder is decompressed. Stomach/Bowel: Postsurgical changes are noted in the rectosigmoid region. No obstructive or inflammatory changes of the colon are noted. The appendix has been surgically removed. Mildly prominent fluid-filled loops of jejunum and likely proximal ileum are noted. Some fecalization of bowel contents is noted in the right lower quadrant with relatively normal small bowel noted distally. A transition zone is seen in the right lower quadrant  best seen on image number 34 of series 6. No mass lesion is noted in this region although the bowel loops are somewhat intimately apposed to the anterior abdominal wall likely representing some adhesions. Vascular/Lymphatic: Aortic atherosclerosis. No enlarged abdominal or pelvic lymph nodes. Reproductive: Uterus and bilateral adnexa are unremarkable. Other: No abdominal wall hernia or abnormality. No abdominopelvic ascites. Musculoskeletal: Degenerative changes of lumbar spine are noted. IMPRESSION: Mildly prominent loops of jejunum and proximal ileum with evidence of fecalization of small bowel contents and abrupt caliber change in the right lower quadrant consistent with partial small bowel obstruction likely related to adhesions. No other focal abnormality is noted. Electronically Signed   By: MInez CatalinaM.D.   On: 04/26/2018 01:44   Dg Abdomen Acute W/chest  Result Date: 04/26/2018 CLINICAL DATA:  78y/o  F; abdominal pain and vomiting. EXAM: DG ABDOMEN ACUTE W/ 1V CHEST COMPARISON:  02/04/2014 abdomen radiographs. FINDINGS: There is no evidence of dilated bowel loops or free intraperitoneal air. No radiopaque calculi or other significant radiographic abnormality is seen. Heart size and mediastinal contours are within normal limits. Both lungs are clear. Moderate S-shaped curvature of the spine. Aortic calcific atherosclerosis. Surgical sutures project over the pelvis. IMPRESSION: Normal bowel gas pattern.  No acute cardiopulmonary disease. Electronically Signed   By: LKristine GarbeM.D.   On: 04/26/2018 00:33    Pending Labs Unresulted Labs (From admission, onward)    Start     Ordered   04/26/18 0500  Magnesium  Tomorrow morning,   R     04/26/18 0336   04/26/18 06503 Basic metabolic panel  Tomorrow morning,   R     04/26/18 0336   04/26/18 0500  CBC  Tomorrow morning,   R     04/26/18 05465  04/25/18 2010  Urinalysis, Routine w reflex microscopic  ONCE - STAT,   STAT     04/25/18  2010          Vitals/Pain Today's Vitals   04/25/18 2010 04/26/18 0145 04/26/18 0215 04/26/18 0230  BP: (!) 155/76 (!) 136/59 (!) 169/76 (!) 186/78  Pulse:  73 78 77  Resp:      Temp:      TempSrc:      SpO2:  99% 100% 98%  PainSc:        Isolation Precautions No active isolations  Medications Medications  0.9 %  sodium chloride infusion (has no administration in time range)  fentaNYL (SUBLIMAZE) injection 25-50 mcg (has no administration in time range)  ondansetron (ZOFRAN) injection 4 mg (has no administration in time range)  pantoprazole (PROTONIX) injection 40 mg (has no administration in time range)  sodium chloride flush (NS) 0.9 % injection 3 mL (3 mLs Intravenous Given 04/26/18 0100)  sodium chloride 0.9 % bolus 1,000 mL (1,000 mLs Intravenous New Bag/Given 04/26/18 0101)  ondansetron (ZOFRAN) injection 4 mg (4 mg Intravenous Given 04/26/18 0104)  fentaNYL (SUBLIMAZE) injection 25 mcg (25 mcg Intravenous Given 04/26/18 0106)  iohexol (OMNIPAQUE) 300 MG/ML solution 100 mL (100 mLs Intravenous Contrast Given 04/26/18 0119)  sodium chloride 0.9 % bolus 1,000 mL (1,000 mLs Intravenous New Bag/Given 04/26/18 0238)    Mobility walks Low fall risk   Focused Assessments GI   R Recommendations: See Admitting Provider Note  Report given to:   Additional Notes: unsuccessful attempts to insert NG tube; hospitalist dc'd order and stated that if it needed to be done tomorrow we can consult radiology

## 2018-04-27 LAB — BASIC METABOLIC PANEL
Anion gap: 7 (ref 5–15)
BUN: 11 mg/dL (ref 8–23)
CO2: 26 mmol/L (ref 22–32)
Calcium: 8.6 mg/dL — ABNORMAL LOW (ref 8.9–10.3)
Chloride: 105 mmol/L (ref 98–111)
Creatinine, Ser: 0.71 mg/dL (ref 0.44–1.00)
GFR calc Af Amer: >60 mL/min (ref >60)
GFR calc non Af Amer: >60 mL/min (ref >60)
Glucose, Bld: 128 mg/dL — ABNORMAL HIGH (ref 70–99)
Potassium: 3.3 mmol/L — ABNORMAL LOW (ref 3.5–5.1)
Sodium: 138 mmol/L (ref 135–145)

## 2018-04-27 LAB — CBC
HCT: 36 % (ref 36.0–46.0)
Hemoglobin: 11.8 g/dL — ABNORMAL LOW (ref 12.0–15.0)
MCH: 29.9 pg (ref 26.0–34.0)
MCHC: 32.8 g/dL (ref 30.0–36.0)
MCV: 91.1 fL (ref 80.0–100.0)
Platelets: 272 10*3/uL (ref 150–400)
RBC: 3.95 MIL/uL (ref 3.87–5.11)
RDW: 14 % (ref 11.5–15.5)
WBC: 7.6 10*3/uL (ref 4.0–10.5)
nRBC: 0 % (ref 0.0–0.2)

## 2018-04-27 MED ORDER — POTASSIUM CHLORIDE 10 MEQ/100ML IV SOLN
10.0000 meq | INTRAVENOUS | Status: AC
  Administered 2018-04-27: 10 meq via INTRAVENOUS
  Filled 2018-04-27: qty 100

## 2018-04-27 MED ORDER — POTASSIUM CHLORIDE CRYS ER 20 MEQ PO TBCR
40.0000 meq | EXTENDED_RELEASE_TABLET | Freq: Once | ORAL | Status: AC
  Administered 2018-04-27: 40 meq via ORAL
  Filled 2018-04-27: qty 2

## 2018-04-27 MED ORDER — POTASSIUM CHLORIDE 20 MEQ PO PACK: 40.0000 meq | PACK | Freq: Once | ORAL | Status: DC

## 2018-04-27 MED ORDER — AMLODIPINE BESYLATE 5 MG PO TABS
5.0000 mg | ORAL_TABLET | Freq: Every day | ORAL | Status: DC
  Administered 2018-04-27 – 2018-04-28 (×2): 5 mg via ORAL
  Filled 2018-04-27 (×2): qty 1

## 2018-04-27 MED ORDER — METOPROLOL SUCCINATE ER 25 MG PO TB24
50.0000 mg | ORAL_TABLET | Freq: Every day | ORAL | Status: DC
  Administered 2018-04-27 – 2018-04-28 (×2): 50 mg via ORAL
  Filled 2018-04-27 (×2): qty 2

## 2018-04-27 MED ORDER — PANTOPRAZOLE SODIUM 40 MG PO TBEC
40.0000 mg | DELAYED_RELEASE_TABLET | Freq: Every day | ORAL | Status: DC
  Administered 2018-04-28: 40 mg via ORAL
  Filled 2018-04-27: qty 1

## 2018-04-27 NOTE — Progress Notes (Signed)
Pt receiving IV potassium with IVF running concurrently. Pt c/o soreness to R arm IV site. No complications observed and line flushes well. Reduced rate and place warm compress to arm with limb neurtral to body. Will continue to monitor.

## 2018-04-27 NOTE — Plan of Care (Signed)
  Problem: Education: Goal: Knowledge of General Education information will improve Description: Including pain rating scale, medication(s)/side effects and non-pharmacologic comfort measures Outcome: Progressing   Problem: Nutrition: Goal: Adequate nutrition will be maintained Outcome: Progressing   Problem: Coping: Goal: Level of anxiety will decrease Outcome: Progressing   

## 2018-04-27 NOTE — Progress Notes (Signed)
New peripheral to L forearm is patent and flushes easily. Stated potassium with IVF concurrently and pt immediately c/o pain to site. Consulted pharmacy who advised reducing rate down to 11m/hr. Pt now tolerating current dose. Will continue to monitor.

## 2018-04-27 NOTE — Progress Notes (Signed)
[facility]/Trauma Progress Note      Assessment/Plan Hx of diverticulitis, colovesical fistula, appendectomy  SBO with likely transition point in RLQ - abd film showed contrast in colon, + BM's and flatus, does not appear clinically obstructed - DC NGT - we will follow  FEN: CLD and advance as tolerated VTE: SCD's, lovenox ID: none Follow up: TBD  DISPO: contrast in colon, DC NGT, CLD and advance diet as tolerated. Possible afternoon discharge   LOS: 1 day    Subjective: CC: sore throat  No issues overnight. Pt is having copious BM's and flatus. Very mild abdominal pain.   Objective: Vital signs in last 24 hours: Temp:  [98.6 F (37 C)-99.6 F (37.6 C)] 98.6 F (37 C) (04/17 0448) Pulse Rate:  [85-92] 85 (04/17 0448) Resp:  [16-18] 16 (04/17 0448) BP: (167-183)/(65-69) 167/65 (04/17 0448) SpO2:  [97 %-99 %] 97 % (04/17 0448) Weight:  [62.3 kg] 62.3 kg (04/17 0500) Last BM Date: 04/26/18  Intake/Output from previous day: 04/16 0701 - 04/17 0700 In: 2150.7 [I.V.:2150.7] Out: 3 [Stool:3] Intake/Output this shift: No intake/output data recorded.  PE: Gen:  Alert, NAD, pleasant, cooperative Pulm:  Rate and effort normal Abd: Soft, ND, +BS, very mild generalized TTP without guarding, no peritonitis  Skin: no rashes noted, warm and dry   Anti-infectives: Anti-infectives (From admission, onward)   None      Lab Results:  Recent Labs    04/26/18 0351 04/27/18 0037  WBC 8.9 7.6  HGB 12.7 11.8*  HCT 38.5 36.0  PLT 300 272   BMET Recent Labs    04/26/18 0351 04/27/18 0037  NA 137 138  K 3.7 3.3*  CL 104 105  CO2 20* 26  GLUCOSE 127* 128*  BUN 21 11  CREATININE 0.64 0.71  CALCIUM 8.6* 8.6*   PT/INR No results for input(s): LABPROT, INR in the last 72 hours. CMP     Component Value Date/Time   NA 138 04/27/2018 0037   K 3.3 (L) 04/27/2018 0037   CL 105 04/27/2018 0037   CO2 26 04/27/2018 0037   GLUCOSE 128 (H) 04/27/2018  0037   BUN 11 04/27/2018 0037   CREATININE 0.71 04/27/2018 0037   CALCIUM 8.6 (L) 04/27/2018 0037   PROT 6.7 04/25/2018 2030   ALBUMIN 4.0 04/25/2018 2030   AST 23 04/25/2018 2030   ALT 15 04/25/2018 2030   ALKPHOS 113 04/25/2018 2030   BILITOT 1.1 04/25/2018 2030   GFRNONAA >60 04/27/2018 0037   GFRAA >60 04/27/2018 0037   Lipase     Component Value Date/Time   LIPASE 23 04/25/2018 2030    Studies/Results: Ct Abdomen Pelvis W Contrast  Result Date: 04/26/2018 CLINICAL DATA:  Upper abdominal pain with vomiting EXAM: CT ABDOMEN AND PELVIS WITH CONTRAST TECHNIQUE: Multidetector CT imaging of the abdomen and pelvis was performed using the standard protocol following bolus administration of intravenous contrast. CONTRAST:  160m OMNIPAQUE 300 COMPARISON:  12/12/2013 FINDINGS: Lower chest: No acute abnormality. Hepatobiliary: No focal liver abnormality is seen. No gallstones, gallbladder wall thickening, or biliary dilatation. Pancreas: Unremarkable. No pancreatic ductal dilatation or surrounding inflammatory changes. Spleen: Normal in size without focal abnormality. Adrenals/Urinary Tract: Adrenal glands are within normal limits. Kidneys are well visualized bilaterally. No renal calculi or obstructive changes are seen. Cystic lesion is noted in the right kidney stable from the prior study. Normal excretion of contrast is seen. The bladder is decompressed. Stomach/Bowel: Postsurgical changes are noted in the rectosigmoid region. No  obstructive or inflammatory changes of the colon are noted. The appendix has been surgically removed. Mildly prominent fluid-filled loops of jejunum and likely proximal ileum are noted. Some fecalization of bowel contents is noted in the right lower quadrant with relatively normal small bowel noted distally. A transition zone is seen in the right lower quadrant best seen on image number 34 of series 6. No mass lesion is noted in this region although the bowel loops are  somewhat intimately apposed to the anterior abdominal wall likely representing some adhesions. Vascular/Lymphatic: Aortic atherosclerosis. No enlarged abdominal or pelvic lymph nodes. Reproductive: Uterus and bilateral adnexa are unremarkable. Other: No abdominal wall hernia or abnormality. No abdominopelvic ascites. Musculoskeletal: Degenerative changes of lumbar spine are noted. IMPRESSION: Mildly prominent loops of jejunum and proximal ileum with evidence of fecalization of small bowel contents and abrupt caliber change in the right lower quadrant consistent with partial small bowel obstruction likely related to adhesions. No other focal abnormality is noted. Electronically Signed   By: Inez Catalina M.D.   On: 04/26/2018 01:44   Dg Abdomen Acute W/chest  Result Date: 04/26/2018 CLINICAL DATA:  78 y/o  F; abdominal pain and vomiting. EXAM: DG ABDOMEN ACUTE W/ 1V CHEST COMPARISON:  02/04/2014 abdomen radiographs. FINDINGS: There is no evidence of dilated bowel loops or free intraperitoneal air. No radiopaque calculi or other significant radiographic abnormality is seen. Heart size and mediastinal contours are within normal limits. Both lungs are clear. Moderate S-shaped curvature of the spine. Aortic calcific atherosclerosis. Surgical sutures project over the pelvis. IMPRESSION: Normal bowel gas pattern.  No acute cardiopulmonary disease. Electronically Signed   By: Kristine Garbe M.D.   On: 04/26/2018 00:33   Dg Abd Portable 1v-small Bowel Obstruction Protocol-initial, 8 Hr Delay  Result Date: 04/27/2018 CLINICAL DATA:  78 y/o  F; small-bowel obstruction. EXAM: PORTABLE ABDOMEN - 1 VIEW COMPARISON:  04/26/2018 abdomen radiographs FINDINGS: Enteric tube tip projects over the gastric body. Retained contrast within the right hemicolon and rectum. Surgical sutures project over lower abdomen. Normal bowel gas pattern. Moderate lumbar spine levocurvature. IMPRESSION: Enteric tube tip projects over  gastric body, no residual visible kink. Normal bowel gas pattern. Electronically Signed   By: Kristine Garbe M.D.   On: 04/27/2018 00:04   Dg Abd Portable 1v  Result Date: 04/26/2018 CLINICAL DATA:  79 year old female status post repositioning of nasogastric tube EXAM: PORTABLE ABDOMEN - 1 VIEW COMPARISON:  Prior chest x-ray obtained earlier today at 11:36 a.m. FINDINGS: A gastric tube overlies the gastric bubble. It appears that the tube may be kinked several cm from the proximal side-hole. The lung bases are clear. The bowel gas pattern is nonspecific. There is some gas within mildly dilated small bowel in the left hemiabdomen. Scoliotic curvature of the spine is present with multilevel degenerative changes. IMPRESSION: 1. The gastric tube overlies the stomach. There appears to be a kink in the tube 3.6 cm from the proximal side-hole. Electronically Signed   By: Jacqulynn Cadet M.D.   On: 04/26/2018 14:22   Dg Abd Portable 1v  Result Date: 04/26/2018 CLINICAL DATA:  Nasogastric tube placement. EXAM: PORTABLE ABDOMEN - 1 VIEW COMPARISON:  None. FINDINGS: The bowel gas pattern is normal. Nasogastric tube is seen looped within proximal stomach, with distal tip in expected position of gastric cardia. It appears that a portion of the catheter within the stomach still may be kinked. No radio-opaque calculi or other significant radiographic abnormality are seen. IMPRESSION: Distal portion of nasogastric tube  is looped within proximal stomach. There is still seen probable kink involving the nasogastric tube. Electronically Signed   By: Marijo Conception M.D.   On: 04/26/2018 11:58   Dg Abd Portable 1v-small Bowel Protocol-position Verification  Result Date: 04/26/2018 CLINICAL DATA:  Nasogastric tube placement. EXAM: PORTABLE ABDOMEN - 1 VIEW COMPARISON:  Radiographs and CT scan of same day. FINDINGS: Interval placement of nasogastric tube which appears to be looped within proximal stomach. Distal  tip is in expected position of gastric cardia. The catheter may be kinked. No radiographic abnormal bowel gas pattern is noted. Residual contrast is noted in the bladder. IMPRESSION: Interval placement of nasogastric tube which is looped within proximal stomach, and distal tip is noted in the gastric cardia or most proximal portion of the stomach. A portion of the nasogastric tube appears to be kinked within the gastric lumen. Electronically Signed   By: Marijo Conception M.D.   On: 04/26/2018 09:37      Kalman Drape , [facility] Surgery 04/27/2018, 9:26 AM  Pager: [phone] Mon-Wed, Friday 7:00am-4:30pm Thurs 7am-11:30am  Consults: [phone]

## 2018-04-27 NOTE — Progress Notes (Signed)
Pt c/o pain to IV site. Site is puffy at insertion site and proximal to insertion site. Removed IV and continued warm compress and elevation. MD paged.

## 2018-04-27 NOTE — Progress Notes (Addendum)
Patient Demographics:    [name], is a 78 y.o. female, DOB - 1940-10-17, KPQ:[id number]  Admit date - 04/25/2018   Admitting Physician Rhetta Mura, DO  Outpatient Primary MD for the patient is Cari Caraway, MD  LOS - 1   Chief Complaint  Patient presents with   Abdominal Pain   Emesis        Subjective:    [name] today has no fevers, no emesis,  No chest pain, abdominal pain is improved, patient with flatulence and loose BM,  Assessment  & Plan :    Principal Problem:   SBO (small bowel obstruction) (HCC) Active Problems:   HTN (hypertension)   Abdominal pain   Nausea & vomiting   Acute prerenal azotemia   Leukocytosis  Brief Summary  78 y.o.femalewith medical history significant forcolovesicular fistula status post surgical repair, prior appendectomy and diverticulitis with abscess status post partial colectomy in 2016, HTN admitted  on 04/25/2018 with partial small bowel obstruction  A/p 1)PSBO--- surgical consult appreciated, NG tube and small bowel protocol stated on 04/26/2018, repeat abdominal x-ray on 04/27/2018 shows contrast in the colon patient with loose BM and flatus... Per surgical team okay to remove NG tube as of 04/27/2018 and try liquid diet , WBC is down to 7.6 from 11.0,  2)FEN/hypokalemia --- clear liquid diet for now advance as tolerated, okay to Taper IV fluids, hypokalemia due to GI losses, replace kcl  3)HTN--BP is not at goal, restart metoprolol XL 50 mg daily, amlodipine 5 mg daily, continue to hold losartan, IV hydralazine as needed elevated BP  4Prophylaxis ---  changed to p.o. Protonix for GI prophylaxis, Lovenox for DVT prophylaxis   Disposition/Need for in-Hospital Stay- patient unable to be discharged at this time due to partial small bowel obstruction possible discharge home today or in a.m. if tolerating oral intake  Code Status :  Full  Family Communication:   na  Disposition Plan  :  possible discharge home today or in a.m. if tolerating oral intake  Consults  :  Gen surgery  DVT Prophylaxis  :  Lovenox -   Lab Results  Component Value Date   PLT 272 04/27/2018    Inpatient Medications  Scheduled Meds:  amLODipine  5 mg Oral Daily   enoxaparin (LOVENOX) injection  40 mg Subcutaneous Q24H   metoprolol succinate  50 mg Oral Daily   pantoprazole (PROTONIX) IV  40 mg Intravenous Q24H   Continuous Infusions:  dextrose 5 % and 0.45% NaCl 100 mL/hr at 04/27/18 0514   potassium chloride 10 mEq (04/27/18 1210)   potassium chloride     PRN Meds:.fentaNYL (SUBLIMAZE) injection, hydrALAZINE, ondansetron (ZOFRAN) IV, phenol    Anti-infectives (From admission, onward)   None        Objective:   Vitals:   04/26/18 1358 04/26/18 2027 04/27/18 0448 04/27/18 0500  BP: (!) 172/69 (!) 183/67 (!) 167/65   Pulse: 86 92 85   Resp: 16 18 16    Temp: 98.8 F (37.1 C) 99.6 F (37.6 C) 98.6 F (37 C)   TempSrc: Oral Oral Oral   SpO2: 99% 98% 97%   Weight:    62.3 kg  Height:        Wt  Readings from Last 3 Encounters:  04/27/18 62.3 kg  02/04/14 58.5 kg  01/30/14 58.5 kg     Intake/Output Summary (Last 24 hours) at 04/27/2018 1303 Last data filed at 04/27/2018 5053 Gross per 24 hour  Intake 2150.65 ml  Output 3 ml  Net 2147.65 ml     Physical Exam Patient is examined daily including today on 04/27/2018 , exams remain the same as of yesterday except that has changed   Gen:- Awake Alert,  In no apparent distress  HEENT:- [place].AT, No sclera icterus Nose-NG tube to be removed Neck-Supple Neck,No JVD,.  Lungs-  CTAB , fair symmetrical air movement CV- S1, S2 normal, regular  Abd-  +ve B.Sounds, Abd Soft, No tenderness,    Extremity/Skin:- No  edema, pedal pulses present  Psych-affect is appropriate, oriented x3 Neuro-no new focal deficits, no tremors   Data Review:   Micro Results No  results found for this or any previous visit (from the past 240 hour(s)).  Radiology Reports Ct Abdomen Pelvis W Contrast  Result Date: 04/26/2018 CLINICAL DATA:  Upper abdominal pain with vomiting EXAM: CT ABDOMEN AND PELVIS WITH CONTRAST TECHNIQUE: Multidetector CT imaging of the abdomen and pelvis was performed using the standard protocol following bolus administration of intravenous contrast. CONTRAST:  125m OMNIPAQUE 300 COMPARISON:  12/12/2013 FINDINGS: Lower chest: No acute abnormality. Hepatobiliary: No focal liver abnormality is seen. No gallstones, gallbladder wall thickening, or biliary dilatation. Pancreas: Unremarkable. No pancreatic ductal dilatation or surrounding inflammatory changes. Spleen: Normal in size without focal abnormality. Adrenals/Urinary Tract: Adrenal glands are within normal limits. Kidneys are well visualized bilaterally. No renal calculi or obstructive changes are seen. Cystic lesion is noted in the right kidney stable from the prior study. Normal excretion of contrast is seen. The bladder is decompressed. Stomach/Bowel: Postsurgical changes are noted in the rectosigmoid region. No obstructive or inflammatory changes of the colon are noted. The appendix has been surgically removed. Mildly prominent fluid-filled loops of jejunum and likely proximal ileum are noted. Some fecalization of bowel contents is noted in the right lower quadrant with relatively normal small bowel noted distally. A transition zone is seen in the right lower quadrant best seen on image number 34 of series 6. No mass lesion is noted in this region although the bowel loops are somewhat intimately apposed to the anterior abdominal wall likely representing some adhesions. Vascular/Lymphatic: Aortic atherosclerosis. No enlarged abdominal or pelvic lymph nodes. Reproductive: Uterus and bilateral adnexa are unremarkable. Other: No abdominal wall hernia or abnormality. No abdominopelvic ascites. Musculoskeletal:  Degenerative changes of lumbar spine are noted. IMPRESSION: Mildly prominent loops of jejunum and proximal ileum with evidence of fecalization of small bowel contents and abrupt caliber change in the right lower quadrant consistent with partial small bowel obstruction likely related to adhesions. No other focal abnormality is noted. Electronically Signed   By: MInez CatalinaM.D.   On: 04/26/2018 01:44   Dg Abdomen Acute W/chest  Result Date: 04/26/2018 CLINICAL DATA:  78y/o  F; abdominal pain and vomiting. EXAM: DG ABDOMEN ACUTE W/ 1V CHEST COMPARISON:  02/04/2014 abdomen radiographs. FINDINGS: There is no evidence of dilated bowel loops or free intraperitoneal air. No radiopaque calculi or other significant radiographic abnormality is seen. Heart size and mediastinal contours are within normal limits. Both lungs are clear. Moderate S-shaped curvature of the spine. Aortic calcific atherosclerosis. Surgical sutures project over the pelvis. IMPRESSION: Normal bowel gas pattern.  No acute cardiopulmonary disease. Electronically Signed   By: LMia Creek  [place] M.D.   On: 04/26/2018 00:33   Dg Abd Portable 1v-small Bowel Obstruction Protocol-initial, 8 Hr Delay  Result Date: 04/27/2018 CLINICAL DATA:  78 y/o  F; small-bowel obstruction. EXAM: PORTABLE ABDOMEN - 1 VIEW COMPARISON:  04/26/2018 abdomen radiographs FINDINGS: Enteric tube tip projects over the gastric body. Retained contrast within the right hemicolon and rectum. Surgical sutures project over lower abdomen. Normal bowel gas pattern. Moderate lumbar spine levocurvature. IMPRESSION: Enteric tube tip projects over gastric body, no residual visible kink. Normal bowel gas pattern. Electronically Signed   By: Kristine Garbe M.D.   On: 04/27/2018 00:04   Dg Abd Portable 1v  Result Date: 04/26/2018 CLINICAL DATA:  78 year old female status post repositioning of nasogastric tube EXAM: PORTABLE ABDOMEN - 1 VIEW COMPARISON:  Prior chest x-ray  obtained earlier today at 11:36 a.m. FINDINGS: A gastric tube overlies the gastric bubble. It appears that the tube may be kinked several cm from the proximal side-hole. The lung bases are clear. The bowel gas pattern is nonspecific. There is some gas within mildly dilated small bowel in the left hemiabdomen. Scoliotic curvature of the spine is present with multilevel degenerative changes. IMPRESSION: 1. The gastric tube overlies the stomach. There appears to be a kink in the tube 3.6 cm from the proximal side-hole. Electronically Signed   By: Jacqulynn Cadet M.D.   On: 04/26/2018 14:22   Dg Abd Portable 1v  Result Date: 04/26/2018 CLINICAL DATA:  Nasogastric tube placement. EXAM: PORTABLE ABDOMEN - 1 VIEW COMPARISON:  None. FINDINGS: The bowel gas pattern is normal. Nasogastric tube is seen looped within proximal stomach, with distal tip in expected position of gastric cardia. It appears that a portion of the catheter within the stomach still may be kinked. No radio-opaque calculi or other significant radiographic abnormality are seen. IMPRESSION: Distal portion of nasogastric tube is looped within proximal stomach. There is still seen probable kink involving the nasogastric tube. Electronically Signed   By: Marijo Conception M.D.   On: 04/26/2018 11:58   Dg Abd Portable 1v-small Bowel Protocol-position Verification  Result Date: 04/26/2018 CLINICAL DATA:  Nasogastric tube placement. EXAM: PORTABLE ABDOMEN - 1 VIEW COMPARISON:  Radiographs and CT scan of same day. FINDINGS: Interval placement of nasogastric tube which appears to be looped within proximal stomach. Distal tip is in expected position of gastric cardia. The catheter may be kinked. No radiographic abnormal bowel gas pattern is noted. Residual contrast is noted in the bladder. IMPRESSION: Interval placement of nasogastric tube which is looped within proximal stomach, and distal tip is noted in the gastric cardia or most proximal portion of the  stomach. A portion of the nasogastric tube appears to be kinked within the gastric lumen. Electronically Signed   By: Marijo Conception M.D.   On: 04/26/2018 09:37     CBC Recent Labs  Lab 04/25/18 2030 04/26/18 0351 04/27/18 0037  WBC 11.0* 8.9 7.6  HGB 13.9 12.7 11.8*  HCT 41.0 38.5 36.0  PLT 332 300 272  MCV 90.7 91.2 91.1  MCH 30.8 30.1 29.9  MCHC 33.9 33.0 32.8  RDW 14.0 14.1 14.0    Chemistries  Recent Labs  Lab 04/25/18 2030 04/26/18 0351 04/27/18 0037  NA 137 137 138  K 3.7 3.7 3.3*  CL 101 104 105  CO2 23 20* 26  GLUCOSE 185* 127* 128*  BUN 20 21 11   CREATININE 0.95 0.64 0.71  CALCIUM 9.3 8.6* 8.6*  MG  --  2.0  --  AST 23  --   --   ALT 15  --   --   ALKPHOS 113  --   --   BILITOT 1.1  --   --    ------------------------------------------------------------------------------------------------------------------ No results for input(s): CHOL, HDL, LDLCALC, TRIG, CHOLHDL, LDLDIRECT in the last 72 hours.  Lab Results  Component Value Date   HGBA1C 6.0 (H) 02/04/2014   ------------------------------------------------------------------------------------------------------------------ No results for input(s): TSH, T4TOTAL, T3FREE, THYROIDAB in the last 72 hours.  Invalid input(s): FREET3 ------------------------------------------------------------------------------------------------------------------ No results for input(s): VITAMINB12, FOLATE, FERRITIN, TIBC, IRON, RETICCTPCT in the last 72 hours.  Coagulation profile No results for input(s): INR, PROTIME in the last 168 hours.  No results for input(s): DDIMER in the last 72 hours.  Cardiac Enzymes Recent Labs  Lab 04/26/18 0041  TROPONINI <0.03   ------------------------------------------------------------------------------------------------------------------ No results found for: BNP   Roxan Hockey M.D on 04/27/2018 at 1:03 PM  Go to [email or web address] - for contact info  [facility] -  Office  [phone]

## 2018-04-27 NOTE — Progress Notes (Signed)
Removed NGT per MD order. Pt educated and tolerated well. Provided oral care. Diet advanced to clear liquids. Pt informed of how to order meals.

## 2018-04-28 LAB — BASIC METABOLIC PANEL
Anion gap: 9 (ref 5–15)
BUN: 6 mg/dL — ABNORMAL LOW (ref 8–23)
CO2: 23 mmol/L (ref 22–32)
Calcium: 8.7 mg/dL — ABNORMAL LOW (ref 8.9–10.3)
Chloride: 106 mmol/L (ref 98–111)
Creatinine, Ser: 0.69 mg/dL (ref 0.44–1.00)
GFR calc Af Amer: >60 mL/min (ref >60)
GFR calc non Af Amer: >60 mL/min (ref >60)
Glucose, Bld: 107 mg/dL — ABNORMAL HIGH (ref 70–99)
Potassium: 3.6 mmol/L (ref 3.5–5.1)
Sodium: 138 mmol/L (ref 135–145)

## 2018-04-28 MED ORDER — ONDANSETRON HCL 4 MG PO TABS: 4.0000 mg | ORAL_TABLET | Freq: Four times a day (QID) | ORAL | 0 refills | Status: DC

## 2018-04-28 NOTE — Discharge Summary (Signed)
[name], is a 78 y.o. female  DOB June 22, 1940  MRN [id number].  Admission date:  04/25/2018  Admitting Physician  Rhetta Mura, DO  Discharge Date:  04/28/2018   Primary MD  Cari Caraway, MD  Recommendations for primary care physician for things to follow:   1) soft diet advised--- drink plenty fluids 2) follow-up with primary care physician as needed, sooner if symptoms worsen   Admission Diagnosis  Small bowel obstruction [facility]) [K56.609]   Discharge Diagnosis  Small bowel obstruction ([place]) [K56.609]    Principal Problem:   SBO (small bowel obstruction) ([place]) Active Problems:   HTN (hypertension)   Abdominal pain   Nausea & vomiting   Acute prerenal azotemia   Leukocytosis      Past Medical History:  Diagnosis Date   Anemia    as child   Barrett's esophagus    Blood in urine    has symptoms of UTI- burning and aching lower abdomen   Celiac disease    per Dr. Fuller Plan   Colovesical fistula    Complication of anesthesia 1966   adverse reaction to saddle block   Degenerative joint disease    knees   Diverticulitis    and diverticulosis in sigmoid colon    Erosive esophagitis    GERD (gastroesophageal reflux disease)    Heart murmur    DOES NOT CAUSE ANY PROBLEMS   Hiatal hernia    Hypertension    PONV (postoperative nausea and vomiting)    Rheumatic fever    as child    Past Surgical History:  Procedure Laterality Date   APPENDECTOMY     BUNIONECTOMY Right    CARPAL TUNNEL RELEASE Left    CYSTOSCOPY W/ RETROGRADES Left 12/23/2013   Procedure: CYSTOSCOPY WITH LEFT RETROGRADE PYELOGRAM;  Surgeon: Ailene Rud, MD;  Location: [facility];  Service: Urology;  Laterality: Left;   HYSTEROSCOPY W/D&C N/A 11/06/2013   Procedure: DILATATION AND CURETTAGE /HYSTEROSCOPY;  Surgeon: Daria Pastures, MD;  Location: [facility];  Service: Gynecology;   Laterality: N/A;   KNEE ARTHROSCOPY Right    LAPAROSCOPIC PARTIAL COLECTOMY N/A 02/04/2014   Procedure: LAPAROSCOPIC PARTIAL COLECTOMY;  Surgeon: Pedro Earls, MD;  Location: [facility];  Service: General;  Laterality: N/A;   TAKE DOWN OF INTESTINAL FISTULA N/A 02/04/2014   Procedure: TAKE DOWN OF COLOVESICAL FISTULA;  Surgeon: Pedro Earls, MD;  Location: [facility];  Service: General;  Laterality: N/A;   TONSILLECTOMY     x2   TRANSURETHRAL RESECTION OF BLADDER TUMOR N/A 12/23/2013   Procedure: TRANSURETHRAL RESECTION OF BLADDER TUMOR (TURBT);  Surgeon: Ailene Rud, MD;  Location: [facility];  Service: Urology;  Laterality: N/A;   VESICO-VAGINAL FISTULA REPAIR N/A 02/04/2014   Procedure: COLOVESICAL FISTULA REPAIR ;  Surgeon: Ailene Rud, MD;  Location: [facility];  Service: Urology;  Laterality: N/A;       HPI  from the history and physical done on the day of admission:   Chief Complaint: Abdominal  pain  HPI: [name] is a 78 y.o. female with medical history significant for colovesicular fistula status post surgical repair, diverticulitis with abscess status post partial colectomy in 2016, hypertension, who is admitted to [facility] on 04/25/2018 with partial small bowel obstruction after presenting from home to [facility] emergency department complaining of abdominal pain.  The patient reports 1 day of progressive abdominal discomfort, which she describes as sharp in nature.  Reports distribution of abdominal discomfort is diffuse, but most severe in the right lower abdominal quadrant.  She reports associated nausea resulting in 2-3 episodes of nonbloody, nonbilious emesis.  She also reports diminished flatus production over the last day, and most recent movement occurring greater than 24 hours ago, which she states is unusual for her given her baseline daily bowel movements.  Denies any preceding trauma.  Denies any subjective fever, chills, rigors, or  generalized myalgias.  Denies any associated chest pain, shortness of breath, palpitations, or diaphoresis.  Denies any known or suspected COVID-19 exposures.   She reports multiple prior abdominal surgeries, including surgical repair of colovesicular fistula as well as partial colectomy in the setting of diverticulitis with abscess.  She denies any prior history of small bowel obstruction.   ED Course: Vital signs in the ED were notable for the following: Temperature max 98.3; heart rate 77-78; blood pressure ranged from 155/76 - 169/79; respiratory rate 16, and oxygen saturation 97 100% on room air.  Labs in the ED were notable for the following: CMP notable for sodium 137, bicarbonate 23, creatinine 1.97, and liver enzymes are found to be within normal limits.  Troponin x1-.  CBC notable for white blood cell count of 11,000.  Lactic acid 1.4.  CT of the abdomen/pelvis with IV contrast, per final radiology report showed prominent loops of jejunum and proximal ileus with evidence of transition point in the right lower quadrant consistent with partial bowel obstruction.  The emergency department physician, Dr. Wyvonnia Dusky, discussed the patient's case and imaging with the on-call general surgeon, Dr. Barry Dienes, who agreed that presentation and imaging were suggestive of partial small bowel obstruction. Dr. Barry Dienes recommended initiation of conservative measures for management of such, including n.p.o., IV fluids, PRN IV pain control, PRN IV antiemetics, and placement of NG tube.   While in the ED, the following were administered: Fentanyl 25 mcg IV x1, Zofran 4 mg IV x1, and IV normal saline x2 L bolus.  Subsequently, the patient was admitted to the [place] floor for further evaluation and management of presenting partial small bowel obstruction.        Hospital Course:    Brief Summary 78 y.o.femalewith medical history significant forcolovesicular fistula status post surgical repair,  prior appendectomy and diverticulitis with abscess status post partial colectomy in 2016,HTNadmittedon 04/25/2018 with partial small bowel obstruction  A/p 1)PSBO---surgical consult appreciated,  treated with NG tube and small bowel protocol started on 04/26/2018, repeat abdominal x-ray on 04/27/2018 shows contrast in the colon,  patient with loose BM and flatus... Per surgical team  NG tube removed as of 4/17/2020m, diet advanced, leukocytosis resolved.  Patient with flatus, BM and tolerating oral intake well per surgical team okay to discharge home   2)HTN-- restart metoprolol XL 50 mg daily, amlodipine 5 mg daily, and  Losartan,    Code Status : Full  Family Communication:   na  Disposition Plan  :   Home  Consults  :  Gen surgery  Discharge Condition: stable  Diet and  Activity recommendation:  As advised  Discharge Instructions    Discharge Instructions    Call MD for:  persistant dizziness or light-headedness   Complete by:  As directed    Call MD for:  persistant nausea and vomiting   Complete by:  As directed    Call MD for:  severe uncontrolled pain   Complete by:  As directed    Call MD for:  temperature >100.4   Complete by:  As directed    Diet - low sodium heart healthy   Complete by:  As directed    Discharge instructions   Complete by:  As directed    1) soft diet advised--- drink plenty fluids 2) follow-up with primary care physician as needed, sooner if symptoms worsen   Increase activity slowly   Complete by:  As directed         Discharge Medications     Allergies as of 04/28/2018      Reactions   Gluten Meal    Diarrhea, vomiting that lasts 3-4 hours   Ace Inhibitors    Coughing   Ciprofloxacin    Joint pain   Ibuprofen    Joint pain   Levofloxacin    Pain in tendons   Simvastatin Other (See Comments)   Muscle pain      Medication List    STOP taking these medications   HYDROcodone-acetaminophen 5-325 MG tablet Commonly known  as:  Norco   trimethoprim 100 MG tablet Commonly known as:  TRIMPEX     TAKE these medications   amLODipine 5 MG tablet Commonly known as:  NORVASC Take 5 mg by mouth daily.   losartan 100 MG tablet Commonly known as:  COZAAR Take 100 mg by mouth daily.   metoprolol succinate 50 MG 24 hr tablet Commonly known as:  TOPROL-XL Take 50 mg by mouth every evening.   omeprazole 40 MG capsule Commonly known as:  PRILOSEC Take 40 mg by mouth daily.   ondansetron 4 MG tablet Commonly known as:  ZOFRAN Take 1 tablet (4 mg total) by mouth every 6 (six) hours.   phenazopyridine 200 MG tablet Commonly known as:  Pyridium Take 1 tablet (200 mg total) by mouth 3 (three) times daily as needed for pain.   Restasis 0.05 % ophthalmic emulsion Generic drug:  cycloSPORINE Place 1 drop into both eyes 2 (two) times daily.      Ct Abdomen Pelvis W Contrast  Result Date: 04/26/2018 CLINICAL DATA:  Upper abdominal pain with vomiting EXAM: CT ABDOMEN AND PELVIS WITH CONTRAST TECHNIQUE: Multidetector CT imaging of the abdomen and pelvis was performed using the standard protocol following bolus administration of intravenous contrast. CONTRAST:  119m OMNIPAQUE 300 COMPARISON:  12/12/2013 FINDINGS: Lower chest: No acute abnormality. Hepatobiliary: No focal liver abnormality is seen. No gallstones, gallbladder wall thickening, or biliary dilatation. Pancreas: Unremarkable. No pancreatic ductal dilatation or surrounding inflammatory changes. Spleen: Normal in size without focal abnormality. Adrenals/Urinary Tract: Adrenal glands are within normal limits. Kidneys are well visualized bilaterally. No renal calculi or obstructive changes are seen. Cystic lesion is noted in the right kidney stable from the prior study. Normal excretion of contrast is seen. The bladder is decompressed. Stomach/Bowel: Postsurgical changes are noted in the rectosigmoid region. No obstructive or inflammatory changes of the colon are  noted. The appendix has been surgically removed. Mildly prominent fluid-filled loops of jejunum and likely proximal ileum are noted. Some fecalization of bowel contents is noted in the right lower  quadrant with relatively normal small bowel noted distally. A transition zone is seen in the right lower quadrant best seen on image number 34 of series 6. No mass lesion is noted in this region although the bowel loops are somewhat intimately apposed to the anterior abdominal wall likely representing some adhesions. Vascular/Lymphatic: Aortic atherosclerosis. No enlarged abdominal or pelvic lymph nodes. Reproductive: Uterus and bilateral adnexa are unremarkable. Other: No abdominal wall hernia or abnormality. No abdominopelvic ascites. Musculoskeletal: Degenerative changes of lumbar spine are noted. IMPRESSION: Mildly prominent loops of jejunum and proximal ileum with evidence of fecalization of small bowel contents and abrupt caliber change in the right lower quadrant consistent with partial small bowel obstruction likely related to adhesions. No other focal abnormality is noted. Electronically Signed   By: Inez Catalina M.D.   On: 04/26/2018 01:44   Dg Abdomen Acute W/chest  Result Date: 04/26/2018 CLINICAL DATA:  78 y/o  F; abdominal pain and vomiting. EXAM: DG ABDOMEN ACUTE W/ 1V CHEST COMPARISON:  02/04/2014 abdomen radiographs. FINDINGS: There is no evidence of dilated bowel loops or free intraperitoneal air. No radiopaque calculi or other significant radiographic abnormality is seen. Heart size and mediastinal contours are within normal limits. Both lungs are clear. Moderate S-shaped curvature of the spine. Aortic calcific atherosclerosis. Surgical sutures project over the pelvis. IMPRESSION: Normal bowel gas pattern.  No acute cardiopulmonary disease. Electronically Signed   By: Kristine Garbe M.D.   On: 04/26/2018 00:33   Dg Abd Portable 1v-small Bowel Obstruction Protocol-initial, 8 Hr  Delay  Result Date: 04/27/2018 CLINICAL DATA:  78 y/o  F; small-bowel obstruction. EXAM: PORTABLE ABDOMEN - 1 VIEW COMPARISON:  04/26/2018 abdomen radiographs FINDINGS: Enteric tube tip projects over the gastric body. Retained contrast within the right hemicolon and rectum. Surgical sutures project over lower abdomen. Normal bowel gas pattern. Moderate lumbar spine levocurvature. IMPRESSION: Enteric tube tip projects over gastric body, no residual visible kink. Normal bowel gas pattern. Electronically Signed   By: Kristine Garbe M.D.   On: 04/27/2018 00:04   Dg Abd Portable 1v  Result Date: 04/26/2018 CLINICAL DATA:  78 year old female status post repositioning of nasogastric tube EXAM: PORTABLE ABDOMEN - 1 VIEW COMPARISON:  Prior chest x-ray obtained earlier today at 11:36 a.m. FINDINGS: A gastric tube overlies the gastric bubble. It appears that the tube may be kinked several cm from the proximal side-hole. The lung bases are clear. The bowel gas pattern is nonspecific. There is some gas within mildly dilated small bowel in the left hemiabdomen. Scoliotic curvature of the spine is present with multilevel degenerative changes. IMPRESSION: 1. The gastric tube overlies the stomach. There appears to be a kink in the tube 3.6 cm from the proximal side-hole. Electronically Signed   By: Jacqulynn Cadet M.D.   On: 04/26/2018 14:22   Dg Abd Portable 1v  Result Date: 04/26/2018 CLINICAL DATA:  Nasogastric tube placement. EXAM: PORTABLE ABDOMEN - 1 VIEW COMPARISON:  None. FINDINGS: The bowel gas pattern is normal. Nasogastric tube is seen looped within proximal stomach, with distal tip in expected position of gastric cardia. It appears that a portion of the catheter within the stomach still may be kinked. No radio-opaque calculi or other significant radiographic abnormality are seen. IMPRESSION: Distal portion of nasogastric tube is looped within proximal stomach. There is still seen probable kink  involving the nasogastric tube. Electronically Signed   By: Marijo Conception M.D.   On: 04/26/2018 11:58   Dg Abd Portable 1v-small Bowel Protocol-position  Verification  Result Date: 04/26/2018 CLINICAL DATA:  Nasogastric tube placement. EXAM: PORTABLE ABDOMEN - 1 VIEW COMPARISON:  Radiographs and CT scan of same day. FINDINGS: Interval placement of nasogastric tube which appears to be looped within proximal stomach. Distal tip is in expected position of gastric cardia. The catheter may be kinked. No radiographic abnormal bowel gas pattern is noted. Residual contrast is noted in the bladder. IMPRESSION: Interval placement of nasogastric tube which is looped within proximal stomach, and distal tip is noted in the gastric cardia or most proximal portion of the stomach. A portion of the nasogastric tube appears to be kinked within the gastric lumen. Electronically Signed   By: Marijo Conception M.D.   On: 04/26/2018 09:37   Today   Subjective    Linton Rump today has no new complaints, tolerating soft diet well, no emesis, no abdominal pain, passing gas and had BM          Patient has been seen and examined prior to discharge   Objective   Blood pressure (!) 157/63, pulse 62, temperature 98.2 F (36.8 C), temperature source Oral, resp. rate 16, height 5' (1.524 m), weight 62.3 kg, SpO2 95 %.   Intake/Output Summary (Last 24 hours) at 04/28/2018 0935 Last data filed at 04/27/2018 1743 Gross per 24 hour  Intake 1178.64 ml  Output --  Net 1178.64 ml   Exam Gen:- Awake Alert,  In no apparent distress  HEENT:- [place].AT, No sclera icterus Neck-Supple Neck,No JVD,.  Lungs-  CTAB , fair symmetrical air movement CV- S1, S2 normal, regular  Abd-  +ve B.Sounds, Abd Soft, No tenderness,    Extremity/Skin:- No  edema, pedal pulses present  Psych-affect is appropriate, oriented x3 Neuro-no new focal deficits, no tremors   Data Review   CBC w Diff:  Lab Results  Component Value Date   WBC 7.6  04/27/2018   HGB 11.8 (L) 04/27/2018   HCT 36.0 04/27/2018   PLT 272 04/27/2018   LYMPHOPCT 22 02/08/2014   MONOPCT 10 02/08/2014   EOSPCT 6 (H) 02/08/2014   BASOPCT 0 02/08/2014    CMP:  Lab Results  Component Value Date   NA 138 04/28/2018   K 3.6 04/28/2018   CL 106 04/28/2018   CO2 23 04/28/2018   BUN 6 (L) 04/28/2018   CREATININE 0.69 04/28/2018   PROT 6.7 04/25/2018   ALBUMIN 4.0 04/25/2018   BILITOT 1.1 04/25/2018   ALKPHOS 113 04/25/2018   AST 23 04/25/2018   ALT 15 04/25/2018  .   Total Discharge time is about 33 minutes  Roxan Hockey M.D on 04/28/2018 at 9:35 AM  Go to [email or web address] -  for contact info  [facility] - Office  [phone]

## 2018-04-28 NOTE — Progress Notes (Signed)
Lorina Rabon to be D/C'd per MD order. Discussed with the patient and all questions fully answered. ? VSS, Skin clean, dry and intact without evidence of skin break down, no evidence of skin tears noted. ? IV catheter discontinued intact. Site without signs and symptoms of complications. Dressing and pressure applied. ? An After Visit Summary was printed and given to the patient. Patient informed where to pickup prescriptions. ? D/c education completed with patient/family including follow up instructions, medication list, d/c activities limitations if indicated, with other d/c instructions as indicated by MD - patient able to verbalize understanding, all questions fully answered.  ? Patient instructed to return to ED, call 911, or call MD for any changes in condition.  ? Patient to be escorted via [place], and D/C home via private auto.

## 2018-04-28 NOTE — Progress Notes (Signed)
Patient ID: [name], female   DOB: 1940/09/14, 78 y.o.   MRN: [id number] [facility] Surgery Progress Note:   * No surgery found *  Subjective: Mental status is clear.  She is excited to be taking a soft diet today.  Tolerating OK Objective: Vital signs in last 24 hours: Temp:  [98.1 F (36.7 C)-98.2 F (36.8 C)] 98.2 F (36.8 C) (04/18 0427) Pulse Rate:  [62-84] 62 (04/18 0427) Resp:  [14-16] 16 (04/18 0427) BP: (141-174)/(59-72) 157/63 (04/18 0427) SpO2:  [95 %-98 %] 95 % (04/18 0427)  Intake/Output from previous day: 04/17 0701 - 04/18 0700 In: 1178.6 [P.O.:600; I.V.:474.2; IV Piggyback:104.4] Out: -  Intake/Output this shift: No intake/output data recorded.  Physical Exam: Work of breathing is normal.    Lab Results:  Results for orders placed or performed during the hospital encounter of 04/25/18 (from the past 48 hour(s))  Basic metabolic panel     Status: Abnormal   Collection Time: 04/27/18 12:37 AM  Result Value Ref Range   Sodium 138 135 - 145 mmol/L   Potassium 3.3 (L) 3.5 - 5.1 mmol/L   Chloride 105 98 - 111 mmol/L   CO2 26 22 - 32 mmol/L   Glucose, Bld 128 (H) 70 - 99 mg/dL   BUN 11 8 - 23 mg/dL   Creatinine, Ser 0.71 0.44 - 1.00 mg/dL   Calcium 8.6 (L) 8.9 - 10.3 mg/dL   GFR calc non Af Amer >60 >60 mL/min   GFR calc Af Amer >60 >60 mL/min   Anion gap 7 5 - 15    Comment: Performed at [facility], [place] [address], [place], [place] [address]  CBC     Status: Abnormal   Collection Time: 04/27/18 12:37 AM  Result Value Ref Range   WBC 7.6 4.0 - 10.5 K/uL   RBC 3.95 3.87 - 5.11 MIL/uL   Hemoglobin 11.8 (L) 12.0 - 15.0 g/dL   HCT 36.0 36.0 - 46.0 %   MCV 91.1 80.0 - 100.0 fL   MCH 29.9 26.0 - 34.0 pg   MCHC 32.8 30.0 - 36.0 g/dL   RDW 14.0 11.5 - 15.5 %   Platelets 272 150 - 400 K/uL   nRBC 0.0 0.0 - 0.2 %    Comment: Performed at [facility], [place] [address], [place], [place] [address]  Basic metabolic panel     Status: Abnormal    Collection Time: 04/28/18  2:37 AM  Result Value Ref Range   Sodium 138 135 - 145 mmol/L   Potassium 3.6 3.5 - 5.1 mmol/L   Chloride 106 98 - 111 mmol/L   CO2 23 22 - 32 mmol/L   Glucose, Bld 107 (H) 70 - 99 mg/dL   BUN 6 (L) 8 - 23 mg/dL   Creatinine, Ser 0.69 0.44 - 1.00 mg/dL   Calcium 8.7 (L) 8.9 - 10.3 mg/dL   GFR calc non Af Amer >60 >60 mL/min   GFR calc Af Amer >60 >60 mL/min   Anion gap 9 5 - 15    Comment: Performed at [facility], [place] [address], [place], [place] [address]    Radiology/Results: Dg Abd Portable 1v-small Bowel Obstruction Protocol-initial, 8 Hr Delay  Result Date: 04/27/2018 CLINICAL DATA:  78 y/o  F; small-bowel obstruction. EXAM: PORTABLE ABDOMEN - 1 VIEW COMPARISON:  04/26/2018 abdomen radiographs FINDINGS: Enteric tube tip projects over the gastric body. Retained contrast within the right hemicolon and rectum. Surgical  sutures project over lower abdomen. Normal bowel gas pattern. Moderate lumbar spine levocurvature. IMPRESSION: Enteric tube tip projects over gastric body, no residual visible kink. Normal bowel gas pattern. Electronically Signed   By: Kristine Garbe M.D.   On: 04/27/2018 00:04   Dg Abd Portable 1v  Result Date: 04/26/2018 CLINICAL DATA:  78 year old female status post repositioning of nasogastric tube EXAM: PORTABLE ABDOMEN - 1 VIEW COMPARISON:  Prior chest x-ray obtained earlier today at 11:36 a.m. FINDINGS: A gastric tube overlies the gastric bubble. It appears that the tube may be kinked several cm from the proximal side-hole. The lung bases are clear. The bowel gas pattern is nonspecific. There is some gas within mildly dilated small bowel in the left hemiabdomen. Scoliotic curvature of the spine is present with multilevel degenerative changes. IMPRESSION: 1. The gastric tube overlies the stomach. There appears to be a kink in the tube 3.6 cm from the proximal side-hole. Electronically Signed   By: Jacqulynn Cadet M.D.   On:  04/26/2018 14:22   Dg Abd Portable 1v  Result Date: 04/26/2018 CLINICAL DATA:  Nasogastric tube placement. EXAM: PORTABLE ABDOMEN - 1 VIEW COMPARISON:  None. FINDINGS: The bowel gas pattern is normal. Nasogastric tube is seen looped within proximal stomach, with distal tip in expected position of gastric cardia. It appears that a portion of the catheter within the stomach still may be kinked. No radio-opaque calculi or other significant radiographic abnormality are seen. IMPRESSION: Distal portion of nasogastric tube is looped within proximal stomach. There is still seen probable kink involving the nasogastric tube. Electronically Signed   By: Marijo Conception M.D.   On: 04/26/2018 11:58   Dg Abd Portable 1v-small Bowel Protocol-position Verification  Result Date: 04/26/2018 CLINICAL DATA:  Nasogastric tube placement. EXAM: PORTABLE ABDOMEN - 1 VIEW COMPARISON:  Radiographs and CT scan of same day. FINDINGS: Interval placement of nasogastric tube which appears to be looped within proximal stomach. Distal tip is in expected position of gastric cardia. The catheter may be kinked. No radiographic abnormal bowel gas pattern is noted. Residual contrast is noted in the bladder. IMPRESSION: Interval placement of nasogastric tube which is looped within proximal stomach, and distal tip is noted in the gastric cardia or most proximal portion of the stomach. A portion of the nasogastric tube appears to be kinked within the gastric lumen. Electronically Signed   By: Marijo Conception M.D.   On: 04/26/2018 09:37    Anti-infectives: Anti-infectives (From admission, onward)   None      Assessment/Plan: Problem List: Patient Active Problem List   Diagnosis Date Noted  . SBO (small bowel obstruction) ([place]) 04/26/2018  . Abdominal pain 04/26/2018  . Nausea & vomiting 04/26/2018  . Acute prerenal azotemia 04/26/2018  . Leukocytosis 04/26/2018  . 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 Mass of urinary bladder  12/23/2013  . Uterine polyp 08/06/2013  . Unspecified constipation 08/05/2013  . Thickened endometrium incidentally noted on CT 08/05/2013  . Diverticulitis of large intestine with abscess without bleeding 08/04/2013  . HTN (hypertension) 08/04/2013  . Ovarian cystic mass 02/01/2011  . Tubo-ovarian abscess 02/01/2011  . Hyponatremia 02/01/2011  . Hypokalemia 02/01/2011  . Barrett's esophagus 12/17/2008  . DIVERTICULITIS OF COLON 12/17/2008  . NONSPECIFIC ABN FINDING RAD & OTH EXAM GI TRACT 12/17/2008    SBO resolving;  Colectomy by me in 2016 for diverticulitis and colovescial fistula.  Hopeful discharge today per Hospitalist team.   * No surgery found *  LOS: 2 days   Matt B. Hassell Done, MD, [facility] [phone] beeper [phone]  04/28/2018 8:26 AM

## 2018-04-28 NOTE — Discharge Instructions (Signed)
1) soft diet advised--- drink plenty fluids 2) follow-up with primary care physician as needed, sooner if symptoms worsen

## 2018-05-03 DIAGNOSIS — I10 Essential (primary) hypertension: Secondary | ICD-10-CM | POA: Diagnosis not present

## 2018-05-03 DIAGNOSIS — E782 Mixed hyperlipidemia: Secondary | ICD-10-CM | POA: Diagnosis not present

## 2018-05-03 DIAGNOSIS — K56600 Partial intestinal obstruction, unspecified as to cause: Secondary | ICD-10-CM | POA: Diagnosis not present

## 2018-06-01 DIAGNOSIS — R945 Abnormal results of liver function studies: Secondary | ICD-10-CM | POA: Diagnosis not present

## 2018-06-01 DIAGNOSIS — E782 Mixed hyperlipidemia: Secondary | ICD-10-CM | POA: Diagnosis not present

## 2018-06-01 DIAGNOSIS — Z23 Encounter for immunization: Secondary | ICD-10-CM | POA: Diagnosis not present

## 2018-06-01 DIAGNOSIS — I10 Essential (primary) hypertension: Secondary | ICD-10-CM | POA: Diagnosis not present

## 2018-06-01 DIAGNOSIS — M25561 Pain in right knee: Secondary | ICD-10-CM | POA: Diagnosis not present

## 2018-06-01 DIAGNOSIS — E559 Vitamin D deficiency, unspecified: Secondary | ICD-10-CM | POA: Diagnosis not present

## 2018-06-01 DIAGNOSIS — R7303 Prediabetes: Secondary | ICD-10-CM | POA: Diagnosis not present

## 2018-06-01 DIAGNOSIS — Z0000 Encounter for general adult medical examination without abnormal findings: Secondary | ICD-10-CM | POA: Diagnosis not present

## 2018-06-01 DIAGNOSIS — K219 Gastro-esophageal reflux disease without esophagitis: Secondary | ICD-10-CM | POA: Diagnosis not present

## 2018-06-05 DIAGNOSIS — R7303 Prediabetes: Secondary | ICD-10-CM | POA: Diagnosis not present

## 2018-06-05 DIAGNOSIS — E782 Mixed hyperlipidemia: Secondary | ICD-10-CM | POA: Diagnosis not present

## 2018-06-05 DIAGNOSIS — K219 Gastro-esophageal reflux disease without esophagitis: Secondary | ICD-10-CM | POA: Diagnosis not present

## 2018-06-05 DIAGNOSIS — J309 Allergic rhinitis, unspecified: Secondary | ICD-10-CM | POA: Diagnosis not present

## 2018-06-05 DIAGNOSIS — I10 Essential (primary) hypertension: Secondary | ICD-10-CM | POA: Diagnosis not present

## 2018-06-05 DIAGNOSIS — E559 Vitamin D deficiency, unspecified: Secondary | ICD-10-CM | POA: Diagnosis not present

## 2018-06-05 DIAGNOSIS — K900 Celiac disease: Secondary | ICD-10-CM | POA: Diagnosis not present

## 2018-06-05 DIAGNOSIS — E538 Deficiency of other specified B group vitamins: Secondary | ICD-10-CM | POA: Diagnosis not present

## 2018-06-05 DIAGNOSIS — G479 Sleep disorder, unspecified: Secondary | ICD-10-CM | POA: Diagnosis not present

## 2018-06-22 DIAGNOSIS — I10 Essential (primary) hypertension: Secondary | ICD-10-CM | POA: Diagnosis not present

## 2018-06-22 DIAGNOSIS — E782 Mixed hyperlipidemia: Secondary | ICD-10-CM | POA: Diagnosis not present

## 2018-07-17 DIAGNOSIS — M1711 Unilateral primary osteoarthritis, right knee: Secondary | ICD-10-CM | POA: Diagnosis not present

## 2018-08-01 DIAGNOSIS — Z20828 Contact with and (suspected) exposure to other viral communicable diseases: Secondary | ICD-10-CM | POA: Diagnosis not present

## 2018-10-23 DIAGNOSIS — D513 Other dietary vitamin B12 deficiency anemia: Secondary | ICD-10-CM | POA: Diagnosis not present

## 2018-10-23 DIAGNOSIS — R7303 Prediabetes: Secondary | ICD-10-CM | POA: Diagnosis not present

## 2018-10-23 DIAGNOSIS — R945 Abnormal results of liver function studies: Secondary | ICD-10-CM | POA: Diagnosis not present

## 2018-10-23 DIAGNOSIS — I10 Essential (primary) hypertension: Secondary | ICD-10-CM | POA: Diagnosis not present

## 2018-10-23 DIAGNOSIS — E559 Vitamin D deficiency, unspecified: Secondary | ICD-10-CM | POA: Diagnosis not present

## 2018-10-23 DIAGNOSIS — Z0000 Encounter for general adult medical examination without abnormal findings: Secondary | ICD-10-CM | POA: Diagnosis not present

## 2018-10-23 DIAGNOSIS — Z23 Encounter for immunization: Secondary | ICD-10-CM | POA: Diagnosis not present

## 2018-10-23 DIAGNOSIS — K219 Gastro-esophageal reflux disease without esophagitis: Secondary | ICD-10-CM | POA: Diagnosis not present

## 2018-10-23 DIAGNOSIS — E782 Mixed hyperlipidemia: Secondary | ICD-10-CM | POA: Diagnosis not present

## 2018-10-23 DIAGNOSIS — M25561 Pain in right knee: Secondary | ICD-10-CM | POA: Diagnosis not present

## 2018-10-30 DIAGNOSIS — E782 Mixed hyperlipidemia: Secondary | ICD-10-CM | POA: Diagnosis not present

## 2018-10-30 DIAGNOSIS — I10 Essential (primary) hypertension: Secondary | ICD-10-CM | POA: Diagnosis not present

## 2018-11-08 DIAGNOSIS — I10 Essential (primary) hypertension: Secondary | ICD-10-CM | POA: Diagnosis not present

## 2018-11-08 DIAGNOSIS — K219 Gastro-esophageal reflux disease without esophagitis: Secondary | ICD-10-CM | POA: Diagnosis not present

## 2018-11-08 DIAGNOSIS — Z1389 Encounter for screening for other disorder: Secondary | ICD-10-CM | POA: Diagnosis not present

## 2018-11-08 DIAGNOSIS — R7303 Prediabetes: Secondary | ICD-10-CM | POA: Diagnosis not present

## 2018-11-08 DIAGNOSIS — E782 Mixed hyperlipidemia: Secondary | ICD-10-CM | POA: Diagnosis not present

## 2018-11-08 DIAGNOSIS — K900 Celiac disease: Secondary | ICD-10-CM | POA: Diagnosis not present

## 2018-11-08 DIAGNOSIS — E538 Deficiency of other specified B group vitamins: Secondary | ICD-10-CM | POA: Diagnosis not present

## 2018-11-08 DIAGNOSIS — G479 Sleep disorder, unspecified: Secondary | ICD-10-CM | POA: Diagnosis not present

## 2018-11-08 DIAGNOSIS — Z0000 Encounter for general adult medical examination without abnormal findings: Secondary | ICD-10-CM | POA: Diagnosis not present

## 2018-11-13 ENCOUNTER — Other Ambulatory Visit: Payer: Self-pay | Admitting: Family Medicine

## 2018-11-13 DIAGNOSIS — E2839 Other primary ovarian failure: Secondary | ICD-10-CM

## 2018-11-28 ENCOUNTER — Other Ambulatory Visit: Payer: Self-pay | Admitting: Family Medicine

## 2018-11-28 DIAGNOSIS — Z1231 Encounter for screening mammogram for malignant neoplasm of breast: Secondary | ICD-10-CM

## 2019-01-28 DIAGNOSIS — I10 Essential (primary) hypertension: Secondary | ICD-10-CM | POA: Diagnosis not present

## 2019-01-28 DIAGNOSIS — E782 Mixed hyperlipidemia: Secondary | ICD-10-CM | POA: Diagnosis not present

## 2019-02-08 DIAGNOSIS — I10 Essential (primary) hypertension: Secondary | ICD-10-CM | POA: Diagnosis not present

## 2019-02-08 DIAGNOSIS — E782 Mixed hyperlipidemia: Secondary | ICD-10-CM | POA: Diagnosis not present

## 2019-02-22 ENCOUNTER — Ambulatory Visit
Admission: RE | Admit: 2019-02-22 | Discharge: 2019-02-22 | Disposition: A | Discharge status: Home / Self Care | Payer: Medicare HMO | Source: Ambulatory Visit | Attending: Family Medicine | Admitting: Family Medicine

## 2019-02-22 ENCOUNTER — Other Ambulatory Visit: Payer: Self-pay

## 2019-02-22 DIAGNOSIS — M85852 Other specified disorders of bone density and structure, left thigh: Secondary | ICD-10-CM | POA: Diagnosis not present

## 2019-02-22 DIAGNOSIS — E2839 Other primary ovarian failure: Secondary | ICD-10-CM

## 2019-02-22 DIAGNOSIS — M810 Age-related osteoporosis without current pathological fracture: Secondary | ICD-10-CM | POA: Diagnosis not present

## 2019-02-22 DIAGNOSIS — Z1231 Encounter for screening mammogram for malignant neoplasm of breast: Secondary | ICD-10-CM | POA: Diagnosis not present

## 2019-02-22 DIAGNOSIS — Z780 Asymptomatic menopausal state: Secondary | ICD-10-CM | POA: Diagnosis not present

## 2019-02-22 DIAGNOSIS — M85851 Other specified disorders of bone density and structure, right thigh: Secondary | ICD-10-CM | POA: Diagnosis not present

## 2019-04-26 DIAGNOSIS — E782 Mixed hyperlipidemia: Secondary | ICD-10-CM | POA: Diagnosis not present

## 2019-04-26 DIAGNOSIS — M810 Age-related osteoporosis without current pathological fracture: Secondary | ICD-10-CM | POA: Diagnosis not present

## 2019-05-01 DIAGNOSIS — Z0000 Encounter for general adult medical examination without abnormal findings: Secondary | ICD-10-CM | POA: Diagnosis not present

## 2019-05-01 DIAGNOSIS — E538 Deficiency of other specified B group vitamins: Secondary | ICD-10-CM | POA: Diagnosis not present

## 2019-05-01 DIAGNOSIS — R7309 Other abnormal glucose: Secondary | ICD-10-CM | POA: Diagnosis not present

## 2019-05-01 DIAGNOSIS — I10 Essential (primary) hypertension: Secondary | ICD-10-CM | POA: Diagnosis not present

## 2019-05-01 DIAGNOSIS — K900 Celiac disease: Secondary | ICD-10-CM | POA: Diagnosis not present

## 2019-05-01 DIAGNOSIS — E559 Vitamin D deficiency, unspecified: Secondary | ICD-10-CM | POA: Diagnosis not present

## 2019-05-01 DIAGNOSIS — E782 Mixed hyperlipidemia: Secondary | ICD-10-CM | POA: Diagnosis not present

## 2019-05-01 DIAGNOSIS — K219 Gastro-esophageal reflux disease without esophagitis: Secondary | ICD-10-CM | POA: Diagnosis not present

## 2019-05-01 DIAGNOSIS — Z79899 Other long term (current) drug therapy: Secondary | ICD-10-CM | POA: Diagnosis not present

## 2019-05-03 DIAGNOSIS — E782 Mixed hyperlipidemia: Secondary | ICD-10-CM | POA: Diagnosis not present

## 2019-05-03 DIAGNOSIS — K219 Gastro-esophageal reflux disease without esophagitis: Secondary | ICD-10-CM | POA: Diagnosis not present

## 2019-05-03 DIAGNOSIS — E559 Vitamin D deficiency, unspecified: Secondary | ICD-10-CM | POA: Diagnosis not present

## 2019-05-03 DIAGNOSIS — I10 Essential (primary) hypertension: Secondary | ICD-10-CM | POA: Diagnosis not present

## 2019-05-03 DIAGNOSIS — J309 Allergic rhinitis, unspecified: Secondary | ICD-10-CM | POA: Diagnosis not present

## 2019-05-03 DIAGNOSIS — K900 Celiac disease: Secondary | ICD-10-CM | POA: Diagnosis not present

## 2019-05-03 DIAGNOSIS — R7309 Other abnormal glucose: Secondary | ICD-10-CM | POA: Diagnosis not present

## 2019-05-03 DIAGNOSIS — H04129 Dry eye syndrome of unspecified lacrimal gland: Secondary | ICD-10-CM | POA: Diagnosis not present

## 2019-05-07 DIAGNOSIS — M5136 Other intervertebral disc degeneration, lumbar region: Secondary | ICD-10-CM | POA: Diagnosis not present

## 2019-05-07 DIAGNOSIS — M9903 Segmental and somatic dysfunction of lumbar region: Secondary | ICD-10-CM | POA: Diagnosis not present

## 2019-05-07 DIAGNOSIS — M25552 Pain in left hip: Secondary | ICD-10-CM | POA: Diagnosis not present

## 2019-05-07 DIAGNOSIS — M9905 Segmental and somatic dysfunction of pelvic region: Secondary | ICD-10-CM | POA: Diagnosis not present

## 2019-05-09 DIAGNOSIS — M9903 Segmental and somatic dysfunction of lumbar region: Secondary | ICD-10-CM | POA: Diagnosis not present

## 2019-05-09 DIAGNOSIS — M5136 Other intervertebral disc degeneration, lumbar region: Secondary | ICD-10-CM | POA: Diagnosis not present

## 2019-05-09 DIAGNOSIS — M9905 Segmental and somatic dysfunction of pelvic region: Secondary | ICD-10-CM | POA: Diagnosis not present

## 2019-05-09 DIAGNOSIS — M25552 Pain in left hip: Secondary | ICD-10-CM | POA: Diagnosis not present

## 2019-05-14 DIAGNOSIS — M9903 Segmental and somatic dysfunction of lumbar region: Secondary | ICD-10-CM | POA: Diagnosis not present

## 2019-05-14 DIAGNOSIS — M5136 Other intervertebral disc degeneration, lumbar region: Secondary | ICD-10-CM | POA: Diagnosis not present

## 2019-05-14 DIAGNOSIS — M25552 Pain in left hip: Secondary | ICD-10-CM | POA: Diagnosis not present

## 2019-05-14 DIAGNOSIS — M9905 Segmental and somatic dysfunction of pelvic region: Secondary | ICD-10-CM | POA: Diagnosis not present

## 2019-05-15 DIAGNOSIS — M25552 Pain in left hip: Secondary | ICD-10-CM | POA: Diagnosis not present

## 2019-05-15 DIAGNOSIS — M9903 Segmental and somatic dysfunction of lumbar region: Secondary | ICD-10-CM | POA: Diagnosis not present

## 2019-05-15 DIAGNOSIS — M5136 Other intervertebral disc degeneration, lumbar region: Secondary | ICD-10-CM | POA: Diagnosis not present

## 2019-05-15 DIAGNOSIS — M9905 Segmental and somatic dysfunction of pelvic region: Secondary | ICD-10-CM | POA: Diagnosis not present

## 2019-05-16 DIAGNOSIS — M9905 Segmental and somatic dysfunction of pelvic region: Secondary | ICD-10-CM | POA: Diagnosis not present

## 2019-05-16 DIAGNOSIS — M9903 Segmental and somatic dysfunction of lumbar region: Secondary | ICD-10-CM | POA: Diagnosis not present

## 2019-05-16 DIAGNOSIS — M25552 Pain in left hip: Secondary | ICD-10-CM | POA: Diagnosis not present

## 2019-05-16 DIAGNOSIS — M5136 Other intervertebral disc degeneration, lumbar region: Secondary | ICD-10-CM | POA: Diagnosis not present

## 2019-05-20 DIAGNOSIS — M9905 Segmental and somatic dysfunction of pelvic region: Secondary | ICD-10-CM | POA: Diagnosis not present

## 2019-05-20 DIAGNOSIS — M5136 Other intervertebral disc degeneration, lumbar region: Secondary | ICD-10-CM | POA: Diagnosis not present

## 2019-05-20 DIAGNOSIS — M9903 Segmental and somatic dysfunction of lumbar region: Secondary | ICD-10-CM | POA: Diagnosis not present

## 2019-05-20 DIAGNOSIS — M25552 Pain in left hip: Secondary | ICD-10-CM | POA: Diagnosis not present

## 2019-05-21 DIAGNOSIS — M9903 Segmental and somatic dysfunction of lumbar region: Secondary | ICD-10-CM | POA: Diagnosis not present

## 2019-05-21 DIAGNOSIS — M25552 Pain in left hip: Secondary | ICD-10-CM | POA: Diagnosis not present

## 2019-05-21 DIAGNOSIS — M9905 Segmental and somatic dysfunction of pelvic region: Secondary | ICD-10-CM | POA: Diagnosis not present

## 2019-05-21 DIAGNOSIS — M5136 Other intervertebral disc degeneration, lumbar region: Secondary | ICD-10-CM | POA: Diagnosis not present

## 2019-05-23 DIAGNOSIS — M5136 Other intervertebral disc degeneration, lumbar region: Secondary | ICD-10-CM | POA: Diagnosis not present

## 2019-05-23 DIAGNOSIS — M9903 Segmental and somatic dysfunction of lumbar region: Secondary | ICD-10-CM | POA: Diagnosis not present

## 2019-05-23 DIAGNOSIS — M9905 Segmental and somatic dysfunction of pelvic region: Secondary | ICD-10-CM | POA: Diagnosis not present

## 2019-05-23 DIAGNOSIS — M25552 Pain in left hip: Secondary | ICD-10-CM | POA: Diagnosis not present

## 2019-05-27 DIAGNOSIS — M9903 Segmental and somatic dysfunction of lumbar region: Secondary | ICD-10-CM | POA: Diagnosis not present

## 2019-05-27 DIAGNOSIS — M5136 Other intervertebral disc degeneration, lumbar region: Secondary | ICD-10-CM | POA: Diagnosis not present

## 2019-05-27 DIAGNOSIS — M25552 Pain in left hip: Secondary | ICD-10-CM | POA: Diagnosis not present

## 2019-05-27 DIAGNOSIS — M9905 Segmental and somatic dysfunction of pelvic region: Secondary | ICD-10-CM | POA: Diagnosis not present

## 2019-05-28 DIAGNOSIS — M9903 Segmental and somatic dysfunction of lumbar region: Secondary | ICD-10-CM | POA: Diagnosis not present

## 2019-05-28 DIAGNOSIS — M5136 Other intervertebral disc degeneration, lumbar region: Secondary | ICD-10-CM | POA: Diagnosis not present

## 2019-05-28 DIAGNOSIS — M25552 Pain in left hip: Secondary | ICD-10-CM | POA: Diagnosis not present

## 2019-05-28 DIAGNOSIS — M9905 Segmental and somatic dysfunction of pelvic region: Secondary | ICD-10-CM | POA: Diagnosis not present

## 2019-05-30 DIAGNOSIS — M25552 Pain in left hip: Secondary | ICD-10-CM | POA: Diagnosis not present

## 2019-05-30 DIAGNOSIS — M9903 Segmental and somatic dysfunction of lumbar region: Secondary | ICD-10-CM | POA: Diagnosis not present

## 2019-05-30 DIAGNOSIS — M5136 Other intervertebral disc degeneration, lumbar region: Secondary | ICD-10-CM | POA: Diagnosis not present

## 2019-05-30 DIAGNOSIS — M9905 Segmental and somatic dysfunction of pelvic region: Secondary | ICD-10-CM | POA: Diagnosis not present

## 2019-06-11 DIAGNOSIS — M9905 Segmental and somatic dysfunction of pelvic region: Secondary | ICD-10-CM | POA: Diagnosis not present

## 2019-06-11 DIAGNOSIS — M25552 Pain in left hip: Secondary | ICD-10-CM | POA: Diagnosis not present

## 2019-06-11 DIAGNOSIS — M9903 Segmental and somatic dysfunction of lumbar region: Secondary | ICD-10-CM | POA: Diagnosis not present

## 2019-06-11 DIAGNOSIS — M5136 Other intervertebral disc degeneration, lumbar region: Secondary | ICD-10-CM | POA: Diagnosis not present

## 2019-06-13 DIAGNOSIS — M9905 Segmental and somatic dysfunction of pelvic region: Secondary | ICD-10-CM | POA: Diagnosis not present

## 2019-06-13 DIAGNOSIS — M5136 Other intervertebral disc degeneration, lumbar region: Secondary | ICD-10-CM | POA: Diagnosis not present

## 2019-06-13 DIAGNOSIS — M25552 Pain in left hip: Secondary | ICD-10-CM | POA: Diagnosis not present

## 2019-06-13 DIAGNOSIS — M9903 Segmental and somatic dysfunction of lumbar region: Secondary | ICD-10-CM | POA: Diagnosis not present

## 2019-06-17 DIAGNOSIS — M5136 Other intervertebral disc degeneration, lumbar region: Secondary | ICD-10-CM | POA: Diagnosis not present

## 2019-06-17 DIAGNOSIS — M9905 Segmental and somatic dysfunction of pelvic region: Secondary | ICD-10-CM | POA: Diagnosis not present

## 2019-06-17 DIAGNOSIS — M9903 Segmental and somatic dysfunction of lumbar region: Secondary | ICD-10-CM | POA: Diagnosis not present

## 2019-06-17 DIAGNOSIS — M25552 Pain in left hip: Secondary | ICD-10-CM | POA: Diagnosis not present

## 2019-06-24 DIAGNOSIS — M5136 Other intervertebral disc degeneration, lumbar region: Secondary | ICD-10-CM | POA: Diagnosis not present

## 2019-06-24 DIAGNOSIS — M25552 Pain in left hip: Secondary | ICD-10-CM | POA: Diagnosis not present

## 2019-06-24 DIAGNOSIS — M9905 Segmental and somatic dysfunction of pelvic region: Secondary | ICD-10-CM | POA: Diagnosis not present

## 2019-06-24 DIAGNOSIS — M9903 Segmental and somatic dysfunction of lumbar region: Secondary | ICD-10-CM | POA: Diagnosis not present

## 2019-07-01 DIAGNOSIS — M5136 Other intervertebral disc degeneration, lumbar region: Secondary | ICD-10-CM | POA: Diagnosis not present

## 2019-07-01 DIAGNOSIS — M9903 Segmental and somatic dysfunction of lumbar region: Secondary | ICD-10-CM | POA: Diagnosis not present

## 2019-07-01 DIAGNOSIS — M25552 Pain in left hip: Secondary | ICD-10-CM | POA: Diagnosis not present

## 2019-07-01 DIAGNOSIS — M9905 Segmental and somatic dysfunction of pelvic region: Secondary | ICD-10-CM | POA: Diagnosis not present

## 2019-09-10 DIAGNOSIS — E782 Mixed hyperlipidemia: Secondary | ICD-10-CM | POA: Diagnosis not present

## 2019-09-10 DIAGNOSIS — I10 Essential (primary) hypertension: Secondary | ICD-10-CM | POA: Diagnosis not present

## 2019-09-10 DIAGNOSIS — M810 Age-related osteoporosis without current pathological fracture: Secondary | ICD-10-CM | POA: Diagnosis not present

## 2019-09-27 DIAGNOSIS — E782 Mixed hyperlipidemia: Secondary | ICD-10-CM | POA: Diagnosis not present

## 2019-09-27 DIAGNOSIS — M810 Age-related osteoporosis without current pathological fracture: Secondary | ICD-10-CM | POA: Diagnosis not present

## 2019-09-27 DIAGNOSIS — I10 Essential (primary) hypertension: Secondary | ICD-10-CM | POA: Diagnosis not present

## 2019-10-21 DIAGNOSIS — R0789 Other chest pain: Secondary | ICD-10-CM | POA: Diagnosis not present

## 2019-10-21 DIAGNOSIS — R1013 Epigastric pain: Secondary | ICD-10-CM | POA: Diagnosis not present

## 2019-10-21 DIAGNOSIS — Z6825 Body mass index (BMI) 25.0-25.9, adult: Secondary | ICD-10-CM | POA: Diagnosis not present

## 2019-11-05 DIAGNOSIS — I10 Essential (primary) hypertension: Secondary | ICD-10-CM | POA: Diagnosis not present

## 2019-11-05 DIAGNOSIS — R1013 Epigastric pain: Secondary | ICD-10-CM | POA: Diagnosis not present

## 2019-11-05 DIAGNOSIS — E538 Deficiency of other specified B group vitamins: Secondary | ICD-10-CM | POA: Diagnosis not present

## 2019-11-05 DIAGNOSIS — K219 Gastro-esophageal reflux disease without esophagitis: Secondary | ICD-10-CM | POA: Diagnosis not present

## 2019-11-05 DIAGNOSIS — R0789 Other chest pain: Secondary | ICD-10-CM | POA: Diagnosis not present

## 2019-11-05 DIAGNOSIS — K900 Celiac disease: Secondary | ICD-10-CM | POA: Diagnosis not present

## 2019-11-05 DIAGNOSIS — E559 Vitamin D deficiency, unspecified: Secondary | ICD-10-CM | POA: Diagnosis not present

## 2019-11-05 DIAGNOSIS — E782 Mixed hyperlipidemia: Secondary | ICD-10-CM | POA: Diagnosis not present

## 2019-11-05 DIAGNOSIS — R7309 Other abnormal glucose: Secondary | ICD-10-CM | POA: Diagnosis not present

## 2019-11-12 DIAGNOSIS — H9193 Unspecified hearing loss, bilateral: Secondary | ICD-10-CM | POA: Diagnosis not present

## 2019-11-12 DIAGNOSIS — Z0000 Encounter for general adult medical examination without abnormal findings: Secondary | ICD-10-CM | POA: Diagnosis not present

## 2019-11-12 DIAGNOSIS — I700 Atherosclerosis of aorta: Secondary | ICD-10-CM | POA: Diagnosis not present

## 2019-11-12 DIAGNOSIS — E782 Mixed hyperlipidemia: Secondary | ICD-10-CM | POA: Diagnosis not present

## 2019-11-12 DIAGNOSIS — Z23 Encounter for immunization: Secondary | ICD-10-CM | POA: Diagnosis not present

## 2019-11-12 DIAGNOSIS — Z1389 Encounter for screening for other disorder: Secondary | ICD-10-CM | POA: Diagnosis not present

## 2019-11-12 DIAGNOSIS — I10 Essential (primary) hypertension: Secondary | ICD-10-CM | POA: Diagnosis not present

## 2019-11-12 DIAGNOSIS — K900 Celiac disease: Secondary | ICD-10-CM | POA: Diagnosis not present

## 2019-11-12 DIAGNOSIS — R7309 Other abnormal glucose: Secondary | ICD-10-CM | POA: Diagnosis not present

## 2019-12-09 DIAGNOSIS — E782 Mixed hyperlipidemia: Secondary | ICD-10-CM | POA: Diagnosis not present

## 2019-12-09 DIAGNOSIS — M810 Age-related osteoporosis without current pathological fracture: Secondary | ICD-10-CM | POA: Diagnosis not present

## 2019-12-09 DIAGNOSIS — K219 Gastro-esophageal reflux disease without esophagitis: Secondary | ICD-10-CM | POA: Diagnosis not present

## 2019-12-09 DIAGNOSIS — I10 Essential (primary) hypertension: Secondary | ICD-10-CM | POA: Diagnosis not present

## 2020-01-06 DIAGNOSIS — K219 Gastro-esophageal reflux disease without esophagitis: Secondary | ICD-10-CM | POA: Diagnosis not present

## 2020-01-06 DIAGNOSIS — E782 Mixed hyperlipidemia: Secondary | ICD-10-CM | POA: Diagnosis not present

## 2020-01-06 DIAGNOSIS — I10 Essential (primary) hypertension: Secondary | ICD-10-CM | POA: Diagnosis not present

## 2020-01-06 DIAGNOSIS — M810 Age-related osteoporosis without current pathological fracture: Secondary | ICD-10-CM | POA: Diagnosis not present

## 2020-01-22 DIAGNOSIS — K219 Gastro-esophageal reflux disease without esophagitis: Secondary | ICD-10-CM | POA: Diagnosis not present

## 2020-01-22 DIAGNOSIS — I10 Essential (primary) hypertension: Secondary | ICD-10-CM | POA: Diagnosis not present

## 2020-01-22 DIAGNOSIS — E782 Mixed hyperlipidemia: Secondary | ICD-10-CM | POA: Diagnosis not present

## 2020-01-22 DIAGNOSIS — M810 Age-related osteoporosis without current pathological fracture: Secondary | ICD-10-CM | POA: Diagnosis not present

## 2020-03-09 DIAGNOSIS — E782 Mixed hyperlipidemia: Secondary | ICD-10-CM | POA: Diagnosis not present

## 2020-03-09 DIAGNOSIS — K219 Gastro-esophageal reflux disease without esophagitis: Secondary | ICD-10-CM | POA: Diagnosis not present

## 2020-03-09 DIAGNOSIS — I10 Essential (primary) hypertension: Secondary | ICD-10-CM | POA: Diagnosis not present

## 2020-03-09 DIAGNOSIS — M810 Age-related osteoporosis without current pathological fracture: Secondary | ICD-10-CM | POA: Diagnosis not present

## 2020-04-03 DIAGNOSIS — K219 Gastro-esophageal reflux disease without esophagitis: Secondary | ICD-10-CM | POA: Diagnosis not present

## 2020-04-03 DIAGNOSIS — I10 Essential (primary) hypertension: Secondary | ICD-10-CM | POA: Diagnosis not present

## 2020-04-03 DIAGNOSIS — E782 Mixed hyperlipidemia: Secondary | ICD-10-CM | POA: Diagnosis not present

## 2020-04-03 DIAGNOSIS — M810 Age-related osteoporosis without current pathological fracture: Secondary | ICD-10-CM | POA: Diagnosis not present

## 2020-05-14 DIAGNOSIS — K219 Gastro-esophageal reflux disease without esophagitis: Secondary | ICD-10-CM | POA: Diagnosis not present

## 2020-05-14 DIAGNOSIS — M810 Age-related osteoporosis without current pathological fracture: Secondary | ICD-10-CM | POA: Diagnosis not present

## 2020-05-14 DIAGNOSIS — E782 Mixed hyperlipidemia: Secondary | ICD-10-CM | POA: Diagnosis not present

## 2020-05-14 DIAGNOSIS — I10 Essential (primary) hypertension: Secondary | ICD-10-CM | POA: Diagnosis not present

## 2020-08-02 ENCOUNTER — Encounter (HOSPITAL_COMMUNITY): Payer: Self-pay | Admitting: Emergency Medicine

## 2020-08-02 ENCOUNTER — Emergency Department (HOSPITAL_COMMUNITY)
Admission: EM | Admit: 2020-08-02 | Discharge: 2020-08-02 | Disposition: A | Discharge status: Home / Self Care | Payer: Medicare HMO | Attending: Emergency Medicine | Admitting: Emergency Medicine

## 2020-08-02 ENCOUNTER — Emergency Department (HOSPITAL_COMMUNITY): Payer: Medicare HMO

## 2020-08-02 ENCOUNTER — Other Ambulatory Visit: Payer: Self-pay

## 2020-08-02 DIAGNOSIS — Z79899 Other long term (current) drug therapy: Secondary | ICD-10-CM | POA: Diagnosis not present

## 2020-08-02 DIAGNOSIS — R791 Abnormal coagulation profile: Secondary | ICD-10-CM | POA: Diagnosis not present

## 2020-08-02 DIAGNOSIS — G510 Bell's palsy: Secondary | ICD-10-CM | POA: Insufficient documentation

## 2020-08-02 DIAGNOSIS — Z87891 Personal history of nicotine dependence: Secondary | ICD-10-CM | POA: Diagnosis not present

## 2020-08-02 DIAGNOSIS — I10 Essential (primary) hypertension: Secondary | ICD-10-CM | POA: Insufficient documentation

## 2020-08-02 DIAGNOSIS — R29810 Facial weakness: Secondary | ICD-10-CM | POA: Diagnosis not present

## 2020-08-02 LAB — I-STAT CHEM 8, ED
BUN: 16 mg/dL (ref 8–23)
Calcium, Ion: 1.16 mmol/L (ref 1.15–1.40)
Chloride: 101 mmol/L (ref 98–111)
Creatinine, Ser: 0.9 mg/dL (ref 0.44–1.00)
Glucose, Bld: 187 mg/dL — ABNORMAL HIGH (ref 70–99)
HCT: 40 % (ref 36.0–46.0)
Hemoglobin: 13.6 g/dL (ref 12.0–15.0)
Potassium: 3.8 mmol/L (ref 3.5–5.1)
Sodium: 138 mmol/L (ref 135–145)
TCO2: 27 mmol/L (ref 22–32)

## 2020-08-02 LAB — DIFFERENTIAL
Abs Immature Granulocytes: 0.03 10*3/uL (ref 0.00–0.07)
Basophils Absolute: 0 10*3/uL (ref 0.0–0.1)
Basophils Relative: 0 %
Eosinophils Absolute: 0.1 10*3/uL (ref 0.0–0.5)
Eosinophils Relative: 2 %
Immature Granulocytes: 0 %
Lymphocytes Relative: 18 %
Lymphs Abs: 1.5 10*3/uL (ref 0.7–4.0)
Monocytes Absolute: 0.6 10*3/uL (ref 0.1–1.0)
Monocytes Relative: 7 %
Neutro Abs: 6.1 10*3/uL (ref 1.7–7.7)
Neutrophils Relative %: 73 %

## 2020-08-02 LAB — COMPREHENSIVE METABOLIC PANEL
ALT: 25 U/L (ref 0–44)
AST: 22 U/L (ref 15–41)
Albumin: 3.5 g/dL (ref 3.5–5.0)
Alkaline Phosphatase: 167 U/L — ABNORMAL HIGH (ref 38–126)
Anion gap: 8 (ref 5–15)
BUN: 15 mg/dL (ref 8–23)
CO2: 26 mmol/L (ref 22–32)
Calcium: 9.2 mg/dL (ref 8.9–10.3)
Chloride: 101 mmol/L (ref 98–111)
Creatinine, Ser: 1.04 mg/dL — ABNORMAL HIGH (ref 0.44–1.00)
GFR, Estimated: 54 mL/min — ABNORMAL LOW (ref >60)
Glucose, Bld: 193 mg/dL — ABNORMAL HIGH (ref 70–99)
Potassium: 3.8 mmol/L (ref 3.5–5.1)
Sodium: 135 mmol/L (ref 135–145)
Total Bilirubin: 0.5 mg/dL (ref 0.3–1.2)
Total Protein: 6.5 g/dL (ref 6.5–8.1)

## 2020-08-02 LAB — CBC
HCT: 39.4 % (ref 36.0–46.0)
Hemoglobin: 12.7 g/dL (ref 12.0–15.0)
MCH: 29.9 pg (ref 26.0–34.0)
MCHC: 32.2 g/dL (ref 30.0–36.0)
MCV: 92.7 fL (ref 80.0–100.0)
Platelets: 398 10*3/uL (ref 150–400)
RBC: 4.25 MIL/uL (ref 3.87–5.11)
RDW: 13.6 % (ref 11.5–15.5)
WBC: 8.4 10*3/uL (ref 4.0–10.5)
nRBC: 0 % (ref 0.0–0.2)

## 2020-08-02 LAB — PROTIME-INR
INR: 0.9 (ref 0.8–1.2)
Prothrombin Time: 12.4 seconds (ref 11.4–15.2)

## 2020-08-02 LAB — ETHANOL: Alcohol, Ethyl (B): <10 mg/dL (ref <10)

## 2020-08-02 LAB — APTT: aPTT: 28 seconds (ref 24–36)

## 2020-08-02 MED ORDER — VALACYCLOVIR HCL 1 G PO TABS: 1000.0000 mg | ORAL_TABLET | Freq: Three times a day (TID) | ORAL | 0 refills | Status: DC

## 2020-08-02 MED ORDER — PREDNISONE 20 MG PO TABS: 20.0000 mg | ORAL_TABLET | Freq: Two times a day (BID) | ORAL | 0 refills | Status: AC

## 2020-08-02 MED ORDER — HYDRALAZINE HCL 25 MG PO TABS
50.0000 mg | ORAL_TABLET | Freq: Once | ORAL | Status: AC
  Administered 2020-08-02: 50 mg via ORAL
  Filled 2020-08-02: qty 2

## 2020-08-02 NOTE — ED Triage Notes (Signed)
C/o L sided facial droop since yesterday.  No arm drift.  Speech clear.

## 2020-08-02 NOTE — ED Provider Notes (Signed)
Emergency Medicine Provider Triage Evaluation Note  [name] , a 80 y.o. female  was evaluated in triage.  Pt complains of left-sided facial weakness.  Symptoms began yesterday.  No weakness or symptoms elsewhere.  Review of Systems  Positive: L facial weakness Negative: Extremity weakness  Physical Exam  BP (!) 164/71   Pulse 94   Temp 98.5 F (36.9 C) (Oral)   Resp 16   SpO2 97%  Gen:   Awake, no distress   Resp:  Normal effort  MSK:   Moves extremities without difficulty  Other:  Left side facial weakness.  No weakness or numbness of the upper or lower extremities.  Medical Decision Making  Medically screening exam initiated at 1:48 PM.  Appropriate orders placed.  DOAA KENDZIERSKI was informed that the remainder of the evaluation will be completed by another provider, this initial triage assessment does not replace that evaluation, and the importance of remaining in the ED until their evaluation is complete.  Consider bells. But with age, will obtain stroke work up. Pt outside of any stroke window   Franchot Heidelberg, PA-C 08/02/20 1407    Luna Fuse, MD 08/02/20 1759

## 2020-08-02 NOTE — ED Provider Notes (Signed)
[facility] EMERGENCY DEPARTMENT Provider Note   CSN: [id number] Arrival date & time: 08/02/20  1341     History No chief complaint on file.   [name] is a 80 y.o. female.  Patient presents with chief complaint of left facial weakness.  She states she noticed a droop 2 days ago.  She thought it would get better by itself but it did not.  Her family noticed a droop on FaceTime today and brought her to the ER.  She denies any headache no chest pain or abdominal pain.  No fever no cough no vomiting or diarrhea.  Denies any arm or extremity or leg weakness.  Denies any slurred speech.  Also complaining of increased tear production in the left eye.      Past Medical History:  Diagnosis Date   Anemia    as child   Barrett's esophagus    Blood in urine    has symptoms of UTI- burning and aching lower abdomen   Celiac disease    per Dr. Fuller Plan   Colovesical fistula    Complication of anesthesia 1966   adverse reaction to saddle block   Degenerative joint disease    knees   Diverticulitis    and diverticulosis in sigmoid colon    Erosive esophagitis    GERD (gastroesophageal reflux disease)    Heart murmur    DOES NOT CAUSE ANY PROBLEMS   Hiatal hernia    Hypertension    PONV (postoperative nausea and vomiting)    Rheumatic fever    as child    Patient Active Problem List   Diagnosis Date Noted   SBO (small bowel obstruction) ([place]) 04/26/2018   Abdominal pain 04/26/2018   Nausea & vomiting 04/26/2018   Acute prerenal azotemia 04/26/2018   Leukocytosis 04/26/2018   Colovesical fistula 02/04/2014   Mass of urinary bladder 12/23/2013   Uterine polyp 08/06/2013   Unspecified constipation 08/05/2013   Thickened endometrium incidentally noted on CT 08/05/2013   Diverticulitis of large intestine with abscess without bleeding 08/04/2013   HTN (hypertension) 08/04/2013   Ovarian cystic mass 02/01/2011   Tubo-ovarian abscess 02/01/2011   Hyponatremia  02/01/2011   Hypokalemia 02/01/2011   Barrett's esophagus 12/17/2008   DIVERTICULITIS OF COLON 12/17/2008   NONSPECIFIC ABN FINDING RAD & OTH EXAM GI TRACT 12/17/2008    Past Surgical History:  Procedure Laterality Date   APPENDECTOMY     BUNIONECTOMY Right    CARPAL TUNNEL RELEASE Left    CYSTOSCOPY W/ RETROGRADES Left 12/23/2013   Procedure: CYSTOSCOPY WITH LEFT RETROGRADE PYELOGRAM;  Surgeon: Ailene Rud, MD;  Location: [facility];  Service: Urology;  Laterality: Left;   HYSTEROSCOPY WITH D & C N/A 11/06/2013   Procedure: DILATATION AND CURETTAGE /HYSTEROSCOPY;  Surgeon: Daria Pastures, MD;  Location: [facility];  Service: Gynecology;  Laterality: N/A;   KNEE ARTHROSCOPY Right    LAPAROSCOPIC PARTIAL COLECTOMY N/A 02/04/2014   Procedure: LAPAROSCOPIC PARTIAL COLECTOMY;  Surgeon: Pedro Earls, MD;  Location: [facility];  Service: General;  Laterality: N/A;   TAKE DOWN OF INTESTINAL FISTULA N/A 02/04/2014   Procedure: TAKE DOWN OF COLOVESICAL FISTULA;  Surgeon: Pedro Earls, MD;  Location: [facility];  Service: General;  Laterality: N/A;   TONSILLECTOMY     x2   TRANSURETHRAL RESECTION OF BLADDER TUMOR N/A 12/23/2013   Procedure: TRANSURETHRAL RESECTION OF BLADDER TUMOR (TURBT);  Surgeon: Ailene Rud, MD;  Location: [facility];  Service: Urology;  Laterality: N/A;   VESICO-VAGINAL FISTULA REPAIR N/A 02/04/2014   Procedure: COLOVESICAL FISTULA REPAIR ;  Surgeon: Ailene Rud, MD;  Location: [facility];  Service: Urology;  Laterality: N/A;     OB History   No obstetric history on file.     Family History  Problem Relation Age of Onset   Breast cancer Sister    Heart disease Father    Stroke Mother        multiple   Diverticulitis Sister        colon surgery   Diverticulitis Brother         colon surgery   Colon cancer Neg Hx    Esophageal cancer Neg Hx    Rectal cancer Neg Hx    Stomach cancer Neg Hx     Social History   Tobacco Use   Smoking status:  Former    Types: Cigarettes    Quit date: 03/18/1967    Years since quitting: 53.4   Smokeless tobacco: Never  Substance Use Topics   Alcohol use: No   Drug use: No    Home Medications Prior to Admission medications   Medication Sig Start Date End Date Taking? Authorizing Provider  predniSONE (DELTASONE) 20 MG tablet Take 1 tablet (20 mg total) by mouth 2 (two) times daily with a meal for 3 days. 08/02/20 08/05/20 Yes Luna Fuse, MD  valACYclovir (VALTREX) 1000 MG tablet Take 1 tablet (1,000 mg total) by mouth 3 (three) times daily. 08/02/20  Yes Luna Fuse, MD  amLODipine (NORVASC) 5 MG tablet Take 5 mg by mouth daily.     [provider]  cycloSPORINE (RESTASIS) 0.05 % ophthalmic emulsion Place 1 drop into both eyes 2 (two) times daily.    [provider]  losartan (COZAAR) 100 MG tablet Take 100 mg by mouth daily.     [provider]  metoprolol succinate (TOPROL-XL) 50 MG 24 hr tablet Take 50 mg by mouth every evening.     [provider]  omeprazole (PRILOSEC) 40 MG capsule Take 40 mg by mouth daily. 03/24/18   [provider]  ondansetron (ZOFRAN) 4 MG tablet Take 1 tablet (4 mg total) by mouth every 6 (six) hours. 04/28/18   Roxan Hockey, MD  phenazopyridine (PYRIDIUM) 200 MG tablet Take 1 tablet (200 mg total) by mouth 3 (three) times daily as needed for pain. Patient not taking: Reported on 04/26/2018 12/24/13   Carolan Clines, MD    Allergies    Gluten meal, Ace inhibitors, Ciprofloxacin, Ibuprofen, Levofloxacin, and Simvastatin  Review of Systems   Review of Systems  Constitutional:  Negative for fever.  HENT:  Negative for ear pain.   Eyes:  Negative for pain.  Respiratory:  Negative for cough.   Cardiovascular:  Negative for chest pain.  Gastrointestinal:  Negative for abdominal pain.  Genitourinary:  Negative for flank pain.  Musculoskeletal:  Negative for back pain.  Skin:  Negative for rash.  Neurological:   Negative for headaches.   Physical Exam Updated Vital Signs BP (!) 199/61   Pulse 70   Temp 98.5 F (36.9 C) (Oral)   Resp 18   SpO2 100%   Physical Exam Constitutional:      General: She is not in acute distress.    Appearance: Normal appearance.  HENT:     Head: Normocephalic.     Nose: Nose normal.  Eyes:     Extraocular Movements: Extraocular movements intact.  Cardiovascular:  Rate and Rhythm: Normal rate.  Pulmonary:     Effort: Pulmonary effort is normal.  Musculoskeletal:        General: Normal range of motion.     Cervical back: Normal range of motion.  Neurological:     Mental Status: She is alert.     Comments: Patient has weakness of eye closing on the left side.  Decreased forehead creases on the left side.  Left-sided facial droop noted.  Increased tear production noted on the left eye.    ED Results / Procedures / Treatments   Labs (all labs ordered are listed, but only abnormal results are displayed) Labs Reviewed  COMPREHENSIVE METABOLIC PANEL - Abnormal; Notable for the following components:      Result Value   Glucose, Bld 193 (*)    Creatinine, Ser 1.04 (*)    Alkaline Phosphatase 167 (*)    GFR, Estimated 54 (*)    All other components within normal limits  I-STAT CHEM 8, ED - Abnormal; Notable for the following components:   Glucose, Bld 187 (*)    All other components within normal limits  ETHANOL  PROTIME-INR  APTT  CBC  DIFFERENTIAL  RAPID URINE DRUG SCREEN, HOSP PERFORMED  URINALYSIS, ROUTINE W REFLEX MICROSCOPIC    EKG EKG Interpretation  Date/Time:  Sunday August 02 2020 13:41:59 EDT Ventricular Rate:  92 PR Interval:  138 QRS Duration: 76 QT Interval:  368 QTC Calculation: 455 R Axis:   56 Text Interpretation: Normal sinus rhythm Right atrial enlargement Borderline ECG Confirmed by ,  ([id number]) on 08/02/2020 5:35:40 PM  Radiology CT HEAD WO CONTRAST  Result Date: 08/02/2020 CLINICAL DATA:  Left-sided facial  weakness. EXAM: CT HEAD WITHOUT CONTRAST TECHNIQUE: Contiguous axial images were obtained from the base of the skull through the vertex without intravenous contrast. COMPARISON:  None. FINDINGS: Brain: No evidence of acute infarction, hemorrhage, hydrocephalus, extra-axial collection or mass lesion/mass effect. Vascular: No hyperdense vessel or unexpected calcification. Skull: Normal. Negative for fracture or focal lesion. Sinuses/Orbits: No acute finding. Other: None. IMPRESSION: No acute intracranial abnormality seen. Electronically Signed   By: James  Green Jr M.D.   On: 08/02/2020 14:32    Procedures Procedures   Medications Ordered in ED Medications - No data to display  ED Course  I have reviewed the triage vital signs and the nursing notes.  Pertinent labs & imaging results that were available during my care of the patient were reviewed by me and considered in my medical decision making (see chart for details).    MDM Rules/Calculators/A&P                           Patient seen in triage and multiple labs were sent which are unremarkable.  CT imaging shows no acute findings.  Clinically suspect Bell's palsy.  Patient given a prescription of antiviral medication, steroids, advised follow-up with neurology within the week.  Advising immediate return for worsening symptoms or any additional concerns.  Final Clinical Impression(s) / ED Diagnoses Final diagnoses:  Bell's palsy    Rx / DC Orders ED Discharge Orders          Ordered    valACYclovir (VALTREX) 1000 MG tablet  3 times daily        08/02/20 1802    predniSONE (DELTASONE) 20 MG tablet  2 times daily with meals        07 /24/22 1802  Luna Fuse, MD 08/02/20 [phone]

## 2020-08-02 NOTE — Discharge Instructions (Addendum)
You may need to apply tape to close your affected eye at nighttime.  Use lubricating eyedrops as needed throughout the day.  Call your primary care doctor or specialist as discussed in the next 2-3 days.   Return immediately back to the ER if:  Your symptoms worsen within the next 12-24 hours. You develop new symptoms such as new fevers, persistent vomiting, new pain, shortness of breath, or new weakness or numbness, or if you have any other concerns.

## 2020-08-04 DIAGNOSIS — I10 Essential (primary) hypertension: Secondary | ICD-10-CM | POA: Diagnosis not present

## 2020-08-04 DIAGNOSIS — M810 Age-related osteoporosis without current pathological fracture: Secondary | ICD-10-CM | POA: Diagnosis not present

## 2020-08-04 DIAGNOSIS — K219 Gastro-esophageal reflux disease without esophagitis: Secondary | ICD-10-CM | POA: Diagnosis not present

## 2020-08-04 DIAGNOSIS — E782 Mixed hyperlipidemia: Secondary | ICD-10-CM | POA: Diagnosis not present

## 2020-08-10 ENCOUNTER — Encounter: Payer: Self-pay | Admitting: Neurology

## 2020-08-10 ENCOUNTER — Ambulatory Visit: Payer: Medicare HMO | Admitting: Neurology

## 2020-08-10 ENCOUNTER — Other Ambulatory Visit: Payer: Self-pay

## 2020-08-10 VITALS — BP 165/75 | HR 84 | Ht 60.0 in | Wt 123.0 lb

## 2020-08-10 DIAGNOSIS — R202 Paresthesia of skin: Secondary | ICD-10-CM

## 2020-08-10 DIAGNOSIS — G510 Bell's palsy: Secondary | ICD-10-CM | POA: Diagnosis not present

## 2020-08-10 MED ORDER — GABAPENTIN 100 MG PO CAPS: ORAL_CAPSULE | ORAL | 3 refills | Status: DC

## 2020-08-10 NOTE — Progress Notes (Signed)
[facility] Clinic Note - Initial Visit   Date: 08/10/20  [name] MRN: [id number] DOB: 06-21-1940   Dear Dr. Audley Hose:  Thank you for your kind referral of [name] for consultation of left facial weakness. Although her history is well known to you, please allow [place] to reiterate it for the purpose of our medical record. The patient was accompanied to the clinic by self.  History of Present Illness: [name] is a 80 y.o. female with hypertension and celiac disease referred for urgent evaluation of left facial weakness.   She was talking to her son on video call on 08/01/2020 and noticed that her left eye was droopy.  He called his sister in [place], who came and took patient to the ER. CT head did not show any abnormality.  Her exam showed weakness involving the left upper and lower face, suggestive of Bell's Palsy. She was started on prednisone and valacyclovir, which she completed.  She feels that her left eyelid is getting a little stronger.  She is compliant with patching her eye at night time. No problems with speech, drooling, or swallow.  No facial numbness or vision changes.   For the past year, she has been having burning sensation in the soles and lower legs, which only occurs when she is sleeping.  She tried walking last night and felt that that her discomfort subsided.  No numbness, imbalance, or limb weakness.  She lives at home with husband in a one-level home.  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  She has two grown children.   Out-side paper records, electronic medical record, and images have been reviewed where available and summarized as:  Lab Results  Component Value Date   HGBA1C 6.0 (H) 02/04/2014   No results found for: [id number]  Past Medical History:  Diagnosis Date   Anemia    as child   Barrett's esophagus    Blood in urine    has symptoms of UTI- burning and aching lower abdomen   Celiac disease    per Dr.  Russella Dar   Colovesical fistula    Complication of anesthesia 1966   adverse reaction to saddle block   Degenerative joint disease    knees   Diverticulitis    and diverticulosis in sigmoid colon    Erosive esophagitis    GERD (gastroesophageal reflux disease)    Heart murmur    DOES NOT CAUSE ANY PROBLEMS   Hiatal hernia    Hypertension    PONV (postoperative nausea and vomiting)    Rheumatic fever    as child    Past Surgical History:  Procedure Laterality Date   APPENDECTOMY     BUNIONECTOMY Right    CARPAL TUNNEL RELEASE Left    CYSTOSCOPY W/ RETROGRADES Left 12/23/2013   Procedure: CYSTOSCOPY WITH LEFT RETROGRADE PYELOGRAM;  Surgeon: Kathi Ludwig, MD;  Location: [facility];  Service: Urology;  Laterality: Left;   HYSTEROSCOPY WITH D & C N/A 11/06/2013   Procedure: DILATATION AND CURETTAGE /HYSTEROSCOPY;  Surgeon: Loney Laurence, MD;  Location: [facility];  Service: Gynecology;  Laterality: N/A;   KNEE ARTHROSCOPY Right    LAPAROSCOPIC PARTIAL COLECTOMY N/A 02/04/2014   Procedure: LAPAROSCOPIC PARTIAL COLECTOMY;  Surgeon: Valarie Merino, MD;  Location: [facility];  Service: General;  Laterality: N/A;   TAKE DOWN OF INTESTINAL FISTULA N/A 02/04/2014   Procedure: TAKE DOWN OF COLOVESICAL FISTULA;  Surgeon: Valarie Merino, MD;  Location: [facility];  Service: General;  Laterality: N/A;   TONSILLECTOMY     x2   TRANSURETHRAL RESECTION OF BLADDER TUMOR N/A 12/23/2013   Procedure: TRANSURETHRAL RESECTION OF BLADDER TUMOR (TURBT);  Surgeon: Kathi Ludwig, MD;  Location: [facility];  Service: Urology;  Laterality: N/A;   VESICO-VAGINAL FISTULA REPAIR N/A 02/04/2014   Procedure: COLOVESICAL FISTULA REPAIR ;  Surgeon: Kathi Ludwig, MD;  Location: [facility];  Service: Urology;  Laterality: N/A;     Medications:  Outpatient Encounter Medications as of 08/10/2020  Medication Sig   amLODipine (NORVASC) 5 MG tablet Take 5 mg by mouth daily.    cycloSPORINE (RESTASIS) 0.05 %  ophthalmic emulsion Place 1 drop into both eyes 2 (two) times daily.   losartan (COZAAR) 100 MG tablet Take 100 mg by mouth daily.    metoprolol succinate (TOPROL-XL) 50 MG 24 hr tablet Take 50 mg by mouth every evening.    omeprazole (PRILOSEC) 40 MG capsule Take 40 mg by mouth daily.   ondansetron (ZOFRAN) 4 MG tablet Take 1 tablet (4 mg total) by mouth every 6 (six) hours.   valACYclovir (VALTREX) 1000 MG tablet Take 1 tablet (1,000 mg total) by mouth 3 (three) times daily.   [DISCONTINUED] phenazopyridine (PYRIDIUM) 200 MG tablet Take 1 tablet (200 mg total) by mouth 3 (three) times daily as needed for pain. (Patient not taking: Reported on 04/26/2018)   No facility-administered encounter medications on file as of 08/10/2020.    Allergies:  Allergies  Allergen Reactions   Gluten Meal     Diarrhea, vomiting that lasts 3-4 hours   Ace Inhibitors     Coughing   Ciprofloxacin     Joint pain   Ibuprofen     Joint pain   Levofloxacin     Pain in tendons   Simvastatin Other (See Comments)    Muscle pain    Family History: Family History  Problem Relation Age of Onset   Breast cancer Sister    Heart disease Father    Stroke Mother        multiple   Diverticulitis Sister        colon surgery   Diverticulitis Brother         colon surgery   Colon cancer Neg Hx    Esophageal cancer Neg Hx    Rectal cancer Neg Hx    Stomach cancer Neg Hx     Social History: Social History   Tobacco Use   Smoking status: Former    Types: Cigarettes    Quit date: 03/18/1967    Years since quitting: 53.4   Smokeless tobacco: Never  Substance Use Topics   Alcohol use: No   Drug use: No   Social History   Social History Narrative   Lives at home, with husband. Has a son and daughter. Still ambulatory, still active. Annice Pih daughter    [phone]    Vital Signs:  BP (!) 165/75   Pulse 84   Ht 5' (1.524 m)   Wt 123 lb (55.8 kg)   SpO2 98%   BMI 24.02 kg/m    Neurological  Exam: MENTAL STATUS including orientation to time, place, person, recent and remote memory, attention span and concentration, language, and fund of knowledge is normal.  Speech is not dysarthric.  CRANIAL NERVES: II:  No visual field defects.    III-IV-VI: Pupils equal round and reactive to light.  Normal conjugate, extra-ocular eye movements in all directions  of gaze.  No nystagmus.  Mild left ptosis with lid lag.   V:  Normal facial sensation.    VII:  Left facial asymmetry with 4/5 strength in frontalis, 4/5 orbicularis oculi, 2/5 buccinator, 3/5 orbicularis oris.  Asymmetric smile.  Right facial movements and strength is 5/5 VIII:  Normal hearing and vestibular function.   IX-X:  Normal palatal movement.   XI:  Normal shoulder shrug and head rotation.   XII:  Normal tongue strength and range of motion, no deviation or fasciculation.  MOTOR:  No atrophy, fasciculations or abnormal movements.  No pronator drift.   Upper Extremity:  Right  Left  Deltoid  5/5   5/5   Biceps  5/5   5/5   Triceps  5/5   5/5   Infraspinatus 5/5  5/5  Medial pectoralis 5/5  5/5  Wrist extensors  5/5   5/5   Wrist flexors  5/5   5/5   Finger extensors  5/5   5/5   Finger flexors  5/5   5/5   Dorsal interossei  5/5   5/5   Abductor pollicis  5/5   5/5   Tone (Ashworth scale)  0  0   Lower Extremity:  Right  Left  Hip flexors  5/5   5/5   Hip extensors  5/5   5/5   Adductor 5/5  5/5  Abductor 5/5  5/5  Knee flexors  5/5   5/5   Knee extensors  5/5   5/5   Dorsiflexors  5/5   5/5   Plantarflexors  5/5   5/5   Toe extensors  5/5   5/5   Toe flexors  5/5   5/5   Tone (Ashworth scale)  0  0   MSRs:  Right        Left                  brachioradialis 2+  2+  biceps 2+  2+  triceps 2+  2+  patellar 2+  2+  ankle jerk 1+  1+  Hoffman no  no  plantar response down  down   SENSORY:  Normal and symmetric perception of light touch, pinprick, vibration, and proprioception.  COORDINATION/GAIT: Normal  finger-to- nose-finger.  Intact rapid alternating movements bilaterally.   Gait narrow based and stable, unassisted and stable.   IMPRESSION: Left Bell's palsy.  Exam shows upper and lower LMN VII palsy, with improving weakness at the frontalis and orbicularis oculi.  CT head personally viewed and negative for stroke. - Discussed pathophysiology, natural course, and management options. She completed course of anti-viral therapy and steroids - Start using an eye patch, lubricating ointment to your eye at night time, and normal saline drops as needed during the day  Bilateral leg paresthesias and pain, possible RLS  - NCS/EMG of the legs to characterize the nature of her symptoms  - Start gabapentin 100mg  at bedtime x 1 week, then 200mg  at bedtime  Return to clinic in 4 months.    Thank you for allowing me to participate in patient's care.  If I can answer any additional questions, I would be pleased to do so.    Sincerely,    Suleman Gunning K. Allena Katz, DO

## 2020-08-10 NOTE — Patient Instructions (Addendum)
Start using an eye patch, lubricating ointment to your eye at night time, and normal saline drops as needed during the day  Start gabapentin 144m at bedtime x 1 week, then 2 tablets thereafter.  Nerve testing of the legs.  Do not apply any lotion to your legs on the day of testing.  ELECTROMYOGRAM AND NERVE CONDUCTION STUDIES (EMG/NCS) INSTRUCTIONS  How to Prepare The neurologist conducting the EMG will need to know if you have certain medical conditions. Tell the neurologist and other EMG lab personnel if you: Have a pacemaker or any other electrical medical device Take blood-thinning medications Have hemophilia, a blood-clotting disorder that causes prolonged bleeding Bathing Take a shower or bath shortly before your exam in order to remove oils from your skin. Don't apply lotions or creams before the exam.  What to Expect You'll likely be asked to change into a hospital gown for the procedure and lie down on an examination table. The following explanations can help you understand what will happen during the exam.  Electrodes. The neurologist or a technician places surface electrodes at various locations on your skin depending on where you're experiencing symptoms. Or the neurologist may insert needle electrodes at different sites depending on your symptoms.  Sensations. The electrodes will at times transmit a tiny electrical current that you may feel as a twinge or spasm. The needle electrode may cause discomfort or pain that usually ends shortly after the needle is removed. If you are concerned about discomfort or pain, you may want to talk to the neurologist about taking a short break during the exam.  Instructions. During the needle EMG, the neurologist will assess whether there is any spontaneous electrical activity when the muscle is at rest - activity that isn't present in healthy muscle tissue - and the degree of activity when you slightly contract the muscle.  He or she will give you  instructions on resting and contracting a muscle at appropriate times. Depending on what muscles and nerves the neurologist is examining, he or she may ask you to change positions during the exam.  After your EMG You may experience some temporary, minor bruising where the needle electrode was inserted into your muscle. This bruising should fade within several days. If it persists, contact your primary care doctor.   Return to clinic in 4 months

## 2020-08-13 DIAGNOSIS — G510 Bell's palsy: Secondary | ICD-10-CM | POA: Diagnosis not present

## 2020-08-13 DIAGNOSIS — I10 Essential (primary) hypertension: Secondary | ICD-10-CM | POA: Diagnosis not present

## 2020-08-13 DIAGNOSIS — R202 Paresthesia of skin: Secondary | ICD-10-CM | POA: Diagnosis not present

## 2020-08-31 DIAGNOSIS — R432 Parageusia: Secondary | ICD-10-CM | POA: Diagnosis not present

## 2020-08-31 DIAGNOSIS — I10 Essential (primary) hypertension: Secondary | ICD-10-CM | POA: Diagnosis not present

## 2020-09-15 ENCOUNTER — Other Ambulatory Visit: Payer: Self-pay

## 2020-09-15 ENCOUNTER — Ambulatory Visit: Payer: Medicare HMO | Admitting: Neurology

## 2020-09-15 DIAGNOSIS — R202 Paresthesia of skin: Secondary | ICD-10-CM | POA: Diagnosis not present

## 2020-09-15 NOTE — Procedures (Signed)
[facility]  [place], [place]  [place], [place] [address] Tel: [phone] Fax:  [phone] Test Date:  09/15/2020  Patient: [name] DOB: May 28, 1940 Physician: Narda Amber, DO  Sex: Female Height: 5' " Ref Phys: Narda Amber, DO  ID#: [id number]   Technician:    Patient Complaints: This is a 80 year old female referred for evaluation of bilateral leg paresthesias and pain.  NCV & EMG Findings: Electrodiagnostic testing of the right lower extremity and additional studies of the left shows: Bilateral sural and superficial peroneal sensory responses are within normal limits. Bilateral peroneal and tibial motor responses are within normal limits. Bilateral tibial H reflex studies are within normal limits. There is no evidence of active or chronic motor axonal changes affecting any of the tested muscles.  Motor unit configuration and recruitment pattern is within normal limits.  Impression: This is a normal study of the lower extremities.  In particular, there is no evidence of a sensorimotor polyneuropathy or lumbosacral radiculopathy.   ___________________________ Narda Amber, DO    Nerve Conduction Studies Anti Sensory Summary Table   Stim Site NR Peak (ms) Norm Peak (ms) P-T Amp (V) Norm P-T Amp  Left Sup Peroneal Anti Sensory (Ant Lat Mall)  33C  12 cm    2.4 <4.6 6.2 >3  Right Sup Peroneal Anti Sensory (Ant Lat Mall)  33C  12 cm    2.5 <4.6 7.4 >3  Left Sural Anti Sensory (Lat Mall)  33C  Calf    2.9 <4.6 7.9 >3  Right Sural Anti Sensory (Lat Mall)  33C  Calf    2.7 <4.6 8.9 >3   Motor Summary Table   Stim Site NR Onset (ms) Norm Onset (ms) O-P Amp (mV) Norm O-P Amp Site1 Site2 Delta-0 (ms) Dist (cm) Vel (m/s) Norm Vel (m/s)  Left Peroneal Motor (Ext Dig Brev)  33C  Ankle    2.0 <6.0 3.0 >2.5 B Fib Ankle 7.8 34.0 44 >40  B Fib    9.8  2.7  Poplt B Fib 1.5 8.0 53 >40  Poplt    11.3  2.5         Right Peroneal Motor (Ext Dig Brev)  33C   Ankle    3.4 <6.0 4.2 >2.5 B Fib Ankle 6.4 32.0 50 >40  B Fib    9.8  4.5  Poplt B Fib 1.4 8.0 57 >40  Poplt    11.2  4.3         Left Tibial Motor (Abd Hall Brev)  33C  Ankle    4.1 <6.0 17.4 >4 Knee Ankle 6.9 40.0 58 >40  Knee    11.0  11.2         Right Tibial Motor (Abd Hall Brev)  33C  Ankle    4.0 <6.0 15.5 >4 Knee Ankle 7.2 38.0 53 >40  Knee    11.2  10.6          H Reflex Studies   NR H-Lat (ms) Lat Norm (ms) L-R H-Lat (ms)  Left Tibial (Gastroc)  33C     30.20 <35 0.54  Right Tibial (Gastroc)  33C     29.66 <35 0.54   EMG   Side Muscle Ins Act Fibs Psw Fasc Number Recrt Dur Dur. Amp Amp. Poly Poly. Comment  Right AntTibialis Nml Nml Nml Nml Nml Nml Nml Nml Nml Nml Nml Nml N/A  Right Gastroc Nml Nml Nml Nml Nml Nml Nml Nml Nml Nml Nml  Nml N/A  Right Flex Dig Long Nml Nml Nml Nml Nml Nml Nml Nml Nml Nml Nml Nml N/A  Right RectFemoris Nml Nml Nml Nml Nml Nml Nml Nml Nml Nml Nml Nml N/A  Right GluteusMed Nml Nml Nml Nml Nml Nml Nml Nml Nml Nml Nml Nml N/A      Waveforms:

## 2020-09-16 DIAGNOSIS — M810 Age-related osteoporosis without current pathological fracture: Secondary | ICD-10-CM | POA: Diagnosis not present

## 2020-09-16 DIAGNOSIS — E782 Mixed hyperlipidemia: Secondary | ICD-10-CM | POA: Diagnosis not present

## 2020-09-16 DIAGNOSIS — K219 Gastro-esophageal reflux disease without esophagitis: Secondary | ICD-10-CM | POA: Diagnosis not present

## 2020-09-16 DIAGNOSIS — I10 Essential (primary) hypertension: Secondary | ICD-10-CM | POA: Diagnosis not present

## 2020-10-28 ENCOUNTER — Ambulatory Visit: Payer: Self-pay | Admitting: Neurology

## 2020-10-28 NOTE — Progress Notes (Deleted)
[facility]    Provider:  Dr Jaynee Eagles Requesting Provider: Almyra Free Greggory Brandy, MD Primary Care Provider:  Cari Caraway, MD  CC:  ***  HPI:  [name] is a 80 y.o. female here as requested by Luna Fuse, MD for Bell's palsy. PMHx hypertension, Barrett's esophagus, diverticulitis, SBO, celiac disease, degenerative joint disease, GERD, heart murmur, rheumatic fever, erosive gastritis.  I reviewed her chart, patient appears to have already seen neurology for Bell's palsy.  She saw Dr. Posey Pronto at [facility] neurology and I reviewed her notes: August 01, 2020 her exam showed weakness involving the left upper and lower face suggestive of Bell's palsy, she was started on prednisone about a cycle of year, she feels that her left eyelid is getting a little stronger, she is compliant with patching at night, no problems with speech, drooling or swallow, no facial numbness or vision changes.  Reviewed notes, labs and imaging from outside physicians, which showed:    CT head 724/2022: showed No acute intracranial abnormalities including mass lesion or mass effect, hydrocephalus, extra-axial fluid collection, midline shift, hemorrhage, or acute infarction, large ischemic events (personally reviewed images)    Review of Systems: Patient complains of symptoms per HPI as well as the following symptoms ***. Pertinent negatives and positives per HPI. All others negative.   Social History   Socioeconomic History   Marital status: Married    Spouse name: Not on file   Number of children: 2   Years of education: Not on file   Highest education level: Not on file  Occupational History   Occupation: retired  Tobacco Use   Smoking status: Former    Types: Cigarettes    Quit date: 03/18/1967    Years since quitting: 53.6   Smokeless tobacco: Never  Substance and Sexual Activity   Alcohol use: No   Drug use: No   Sexual activity: Not on file  Other Topics Concern   Not on file   Social History Narrative   Lives at home, with husband. Has a son and daughter. Still ambulatory, still active. Kennyth Lose daughter    [phone]   Social Determinants of Health   Financial Resource Strain: Not on file  Food Insecurity: Not on file  Transportation Needs: Not on file  Physical Activity: Not on file  Stress: Not on file  Social Connections: Not on file  Intimate Partner Violence: Not on file    Family History  Problem Relation Age of Onset   Stroke Mother        multiple   Heart disease Father    Breast cancer Sister    Diverticulitis Sister        colon surgery   Diverticulitis Brother         colon surgery   Colon cancer Neg Hx    Esophageal cancer Neg Hx    Rectal cancer Neg Hx    Stomach cancer Neg Hx     Past Medical History:  Diagnosis Date   Anemia    as child   Barrett's esophagus    Blood in urine    has symptoms of UTI- burning and aching lower abdomen   Celiac disease    per Dr. Fuller Plan   Colovesical fistula    Complication of anesthesia 1966   adverse reaction to saddle block   Degenerative joint disease    knees   Diverticulitis    and diverticulosis in sigmoid colon    Erosive esophagitis  GERD (gastroesophageal reflux disease)    Heart murmur    DOES NOT CAUSE ANY PROBLEMS   Hiatal hernia    Hypertension    PONV (postoperative nausea and vomiting)    Rheumatic fever    as child    Patient Active Problem List   Diagnosis Date Noted   SBO (small bowel obstruction) ([place]) 04/26/2018   Abdominal pain 04/26/2018   Nausea & vomiting 04/26/2018   Acute prerenal azotemia 04/26/2018   Leukocytosis 04/26/2018   Colovesical fistula 02/04/2014   Mass of urinary bladder 12/23/2013   Uterine polyp 08/06/2013   Unspecified constipation 08/05/2013   Thickened endometrium incidentally noted on CT 08/05/2013   Diverticulitis of large intestine with abscess without bleeding 08/04/2013   HTN (hypertension) 08/04/2013   Ovarian cystic  mass 02/01/2011   Tubo-ovarian abscess 02/01/2011   Hyponatremia 02/01/2011   Hypokalemia 02/01/2011   Barrett's esophagus 12/17/2008   DIVERTICULITIS OF COLON 12/17/2008   NONSPECIFIC ABN FINDING RAD & OTH EXAM GI TRACT 12/17/2008    Past Surgical History:  Procedure Laterality Date   APPENDECTOMY     BUNIONECTOMY Right    CARPAL TUNNEL RELEASE Left    CYSTOSCOPY W/ RETROGRADES Left 12/23/2013   Procedure: CYSTOSCOPY WITH LEFT RETROGRADE PYELOGRAM;  Surgeon: Ailene Rud, MD;  Location: [facility];  Service: Urology;  Laterality: Left;   HYSTEROSCOPY WITH D & C N/A 11/06/2013   Procedure: DILATATION AND CURETTAGE /HYSTEROSCOPY;  Surgeon: Daria Pastures, MD;  Location: [facility];  Service: Gynecology;  Laterality: N/A;   KNEE ARTHROSCOPY Right    LAPAROSCOPIC PARTIAL COLECTOMY N/A 02/04/2014   Procedure: LAPAROSCOPIC PARTIAL COLECTOMY;  Surgeon: Pedro Earls, MD;  Location: [facility];  Service: General;  Laterality: N/A;   TAKE DOWN OF INTESTINAL FISTULA N/A 02/04/2014   Procedure: TAKE DOWN OF COLOVESICAL FISTULA;  Surgeon: Pedro Earls, MD;  Location: [facility];  Service: General;  Laterality: N/A;   TONSILLECTOMY     x2   TRANSURETHRAL RESECTION OF BLADDER TUMOR N/A 12/23/2013   Procedure: TRANSURETHRAL RESECTION OF BLADDER TUMOR (TURBT);  Surgeon: Ailene Rud, MD;  Location: [facility];  Service: Urology;  Laterality: N/A;   VESICO-VAGINAL FISTULA REPAIR N/A 02/04/2014   Procedure: COLOVESICAL FISTULA REPAIR ;  Surgeon: Ailene Rud, MD;  Location: [facility];  Service: Urology;  Laterality: N/A;    Current Outpatient Medications  Medication Sig Dispense Refill   amLODipine (NORVASC) 5 MG tablet Take 5 mg by mouth daily.      cycloSPORINE (RESTASIS) 0.05 % ophthalmic emulsion Place 1 drop into both eyes 2 (two) times daily.     gabapentin (NEURONTIN) 100 MG capsule Take 1 tablet at bedtime x 1 week, then 2 tablets at bedtime. 60 capsule 3   losartan (COZAAR) 100  MG tablet Take 100 mg by mouth daily.      metoprolol succinate (TOPROL-XL) 50 MG 24 hr tablet Take 50 mg by mouth every evening.      omeprazole (PRILOSEC) 40 MG capsule Take 40 mg by mouth daily.     ondansetron (ZOFRAN) 4 MG tablet Take 1 tablet (4 mg total) by mouth every 6 (six) hours. 12 tablet 0   valACYclovir (VALTREX) 1000 MG tablet Take 1 tablet (1,000 mg total) by mouth 3 (three) times daily. 21 tablet 0   No current facility-administered medications for this visit.    Allergies as of 10/28/2020 - Review Complete 08/10/2020  Allergen Reaction Noted   Gluten meal  02/01/2011  Ace inhibitors     Ciprofloxacin     Ibuprofen     Levofloxacin     Simvastatin Other (See Comments) 09/25/2012    Vitals: There were no vitals taken for this visit. Last Weight:  Wt Readings from Last 1 Encounters:  08/10/20 123 lb (55.8 kg)   Last Height:   Ht Readings from Last 1 Encounters:  08/10/20 5' (1.524 m)     Physical exam: Exam: Gen: NAD, conversant, well nourised, obese, well groomed                     CV: RRR, no MRG. No Carotid Bruits. No peripheral edema, warm, nontender Eyes: Conjunctivae clear without exudates or hemorrhage  Neuro: Detailed Neurologic Exam  Speech:    Speech is normal; fluent and spontaneous with normal comprehension.  Cognition:    The patient is oriented to person, place, and time;     recent and remote memory intact;     language fluent;     normal attention, concentration,     fund of knowledge Cranial Nerves:    The pupils are equal, round, and reactive to light. The fundi are normal and spontaneous venous pulsations are present. Visual fields are full to finger confrontation. Extraocular movements are intact. Trigeminal sensation is intact and the muscles of mastication are normal. The face is symmetric. The palate elevates in the midline. Hearing intact. Voice is normal. Shoulder shrug is normal. The tongue has normal motion without  fasciculations.   Coordination:    Normal finger to nose and heel to shin. Normal rapid alternating movements.   Gait:    Heel-toe and tandem gait are normal.   Motor Observation:    No asymmetry, no atrophy, and no involuntary movements noted. Tone:    Normal muscle tone.    Posture:    Posture is normal. normal erect    Strength:    Strength is V/V in the upper and lower limbs.      Sensation: intact to LT     Reflex Exam:  DTR's:    Deep tendon reflexes in the upper and lower extremities are normal bilaterally.   Toes:    The toes are downgoing bilaterally.   Clonus:    Clonus is absent.    Assessment/Plan:    No orders of the defined types were placed in this encounter.  No orders of the defined types were placed in this encounter.   Cc: Luna Fuse, MD,  Cari Caraway, MD  [facility] Ill, [facility] [address] [place], [place] [address]  Phone [phone] Fax [phone]

## 2020-11-13 DIAGNOSIS — Z1389 Encounter for screening for other disorder: Secondary | ICD-10-CM | POA: Diagnosis not present

## 2020-11-13 DIAGNOSIS — Z0000 Encounter for general adult medical examination without abnormal findings: Secondary | ICD-10-CM | POA: Diagnosis not present

## 2020-11-16 ENCOUNTER — Other Ambulatory Visit: Payer: Self-pay | Admitting: Family Medicine

## 2020-11-16 DIAGNOSIS — Z1231 Encounter for screening mammogram for malignant neoplasm of breast: Secondary | ICD-10-CM

## 2020-12-11 ENCOUNTER — Ambulatory Visit: Payer: Medicare HMO | Admitting: Neurology

## 2020-12-15 IMAGING — DX PORTABLE ABDOMEN - 1 VIEW
1 series · 1 of 1 positions shown · non-contrast
Comparison: Prior chest x-ray obtained earlier today at [DATE] a.m.

CLINICAL DATA: 77-year-old female status post repositioning of
nasogastric tube

EXAM:
PORTABLE ABDOMEN - 1 VIEW

[abdomen kub]
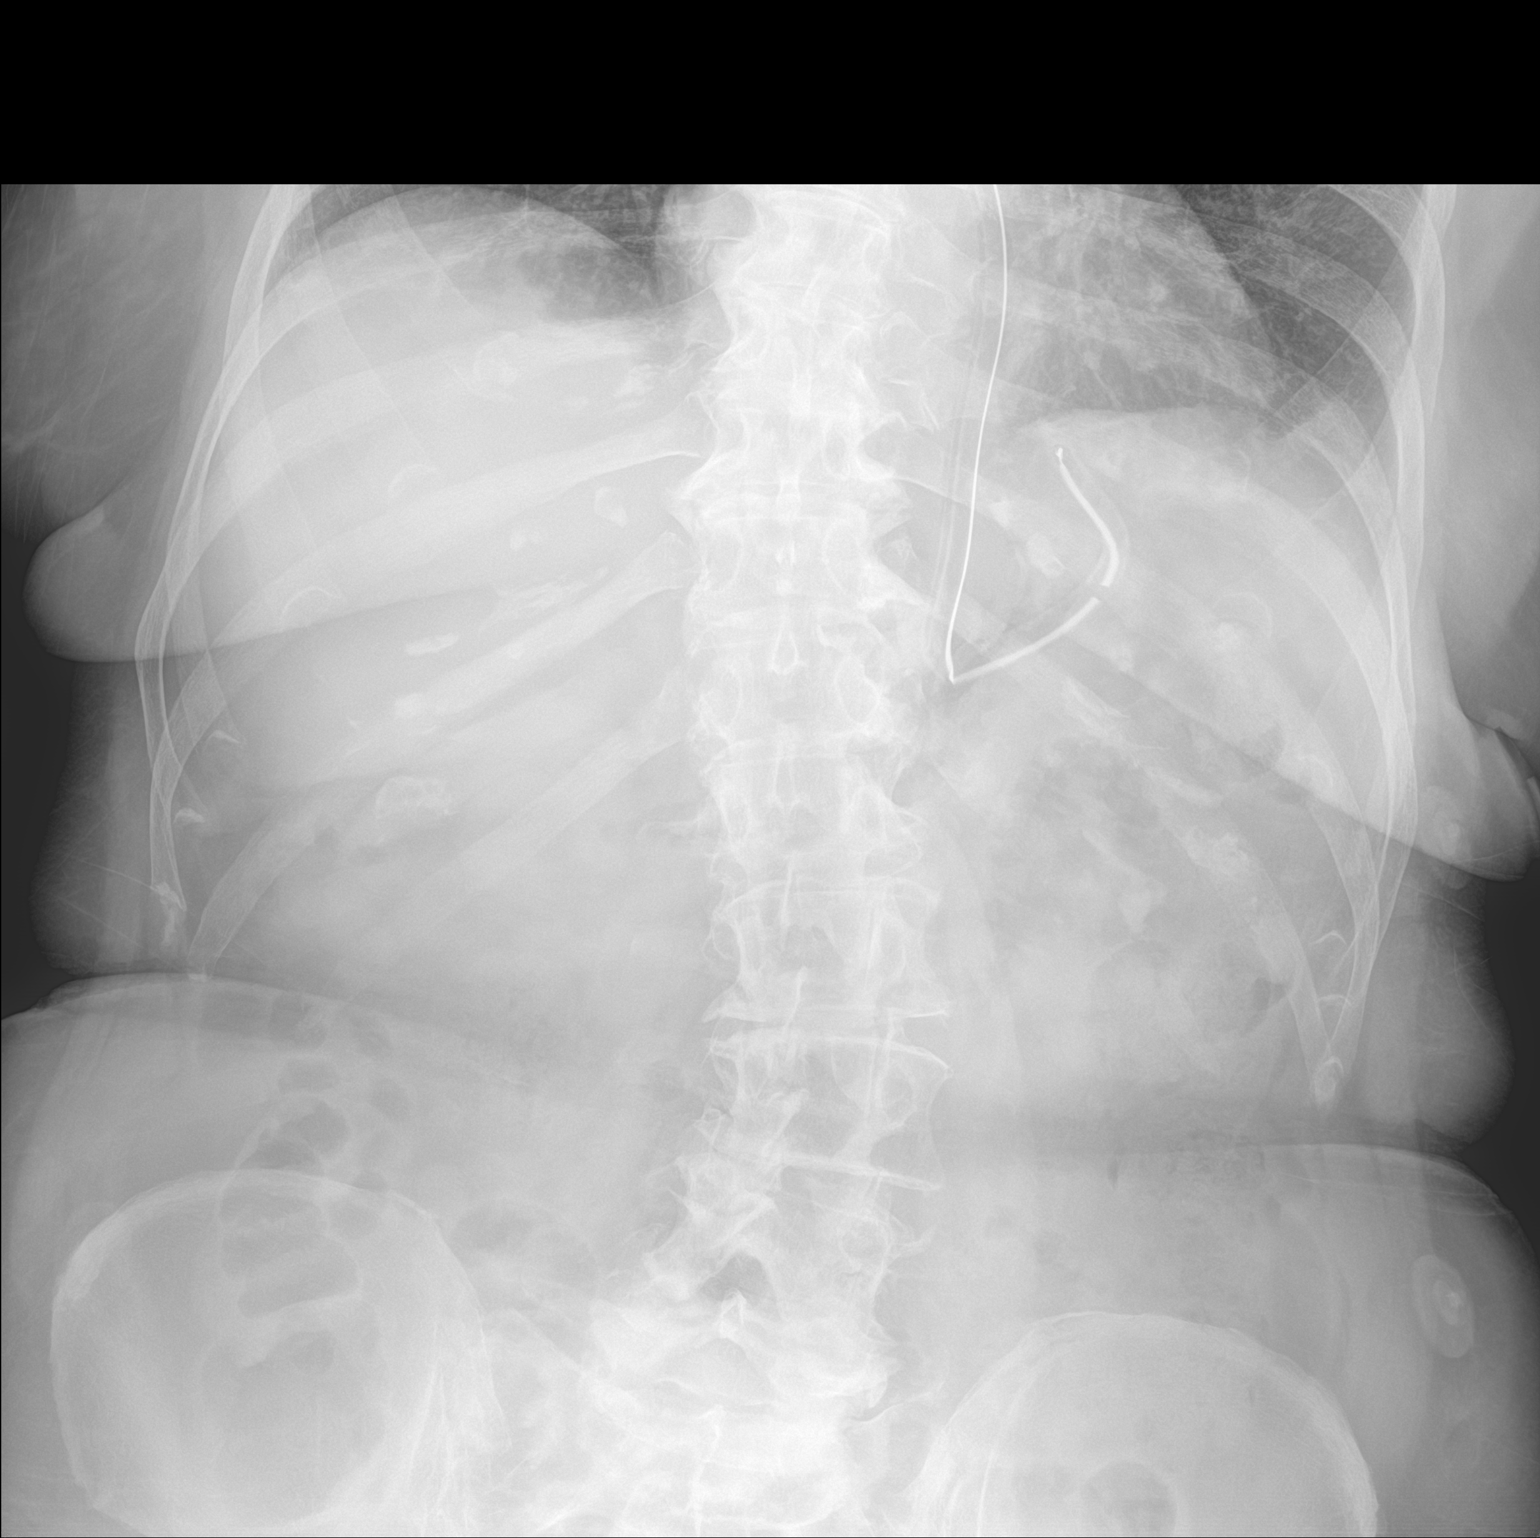

[1 of 1 positions shown; findings below may reference images not displayed]

FINDINGS: A gastric tube overlies the gastric bubble. It appears that the tube
may be kinked several cm from the proximal side-hole. The lung bases
are clear. The bowel gas pattern is nonspecific. There is some gas
within mildly dilated small bowel in the left hemiabdomen. Scoliotic
curvature of the spine is present with multilevel degenerative
changes.
IMPRESSION: 1. The gastric tube overlies the stomach. There appears to be a kink
in the tube 3.6 cm from the proximal side-hole.

## 2020-12-15 IMAGING — DX PORTABLE ABDOMEN - 1 VIEW
1 series · 1 of 1 positions shown · non-contrast
Comparison: 04/26/2018 abdomen radiographs

CLINICAL DATA: 77 y/o  F; small-bowel obstruction.

EXAM:
PORTABLE ABDOMEN - 1 VIEW

[abdomen kub]
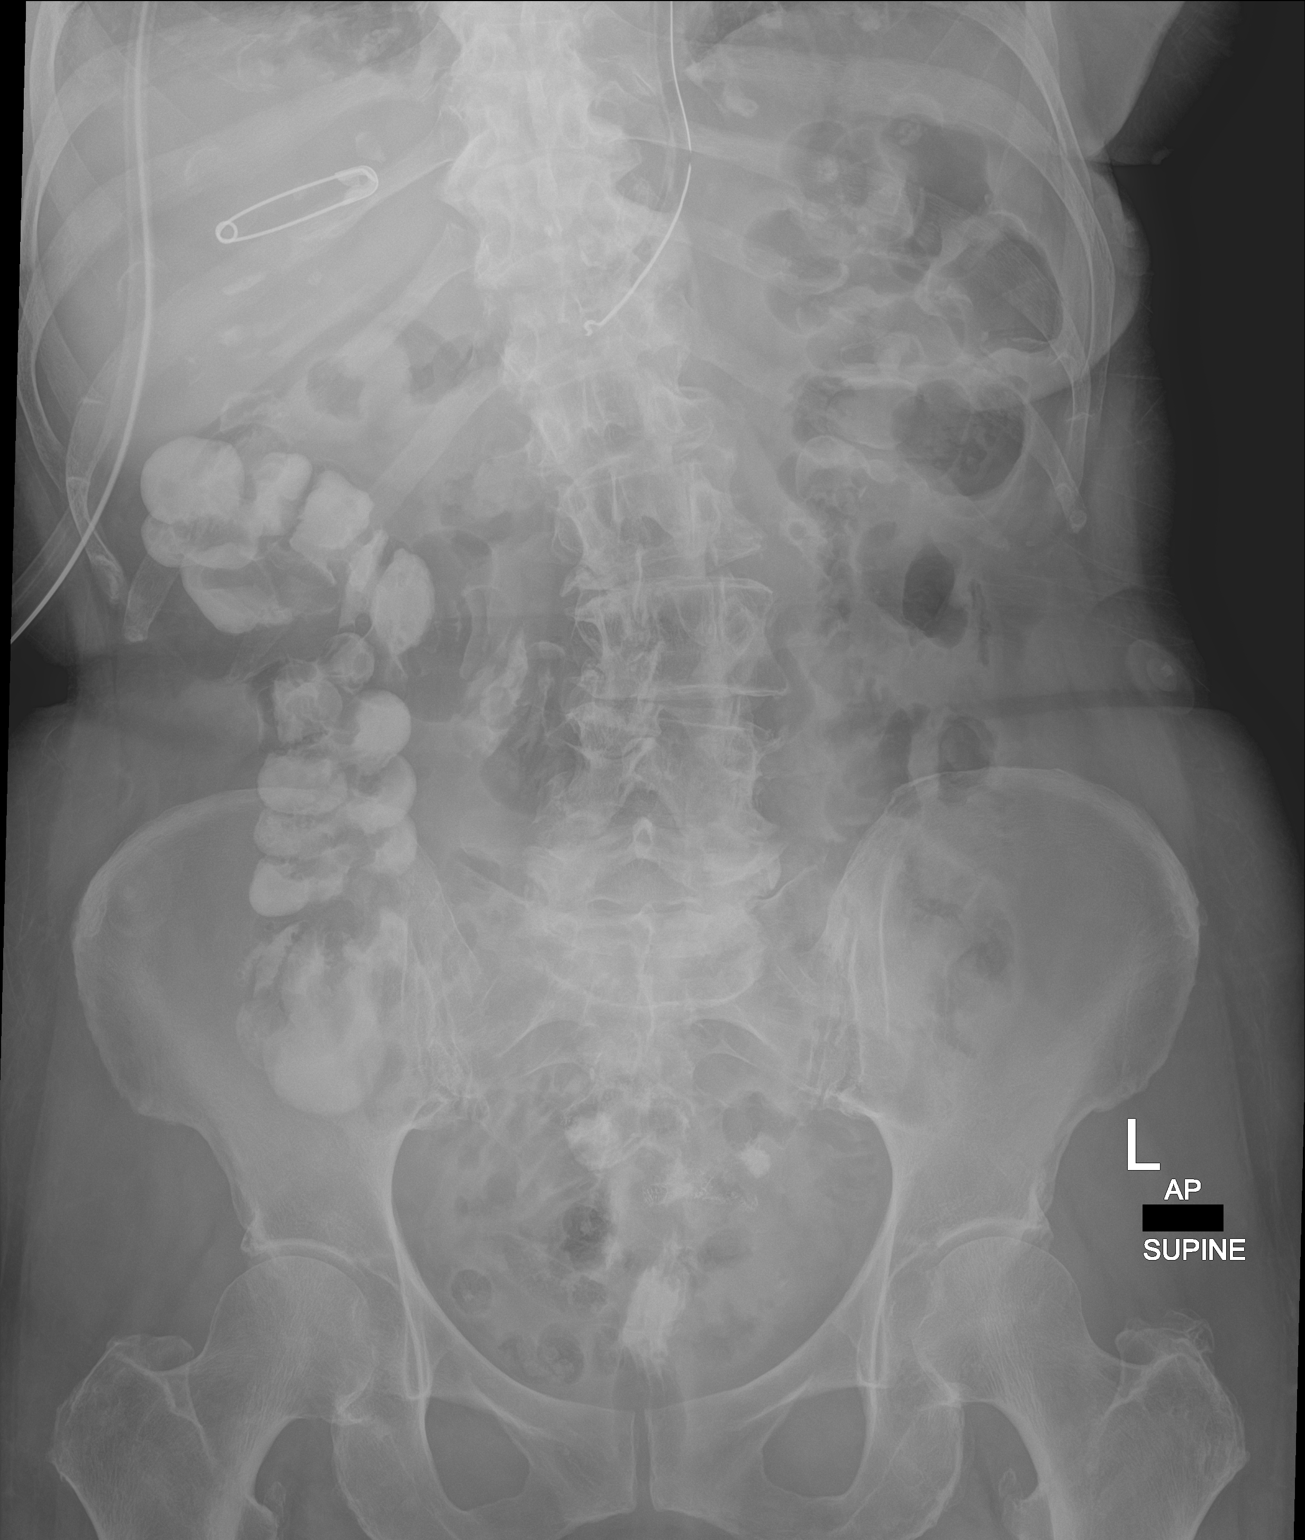

[1 of 1 positions shown; findings below may reference images not displayed]

FINDINGS: Enteric tube tip projects over the gastric body. Retained contrast
within the right hemicolon and rectum. Surgical sutures project over
lower abdomen. Normal bowel gas pattern. Moderate lumbar spine
levocurvature.
IMPRESSION: Enteric tube tip projects over gastric body, no residual visible
kink. Normal bowel gas pattern.

## 2020-12-15 IMAGING — DX PORTABLE ABDOMEN - 1 VIEW
1 series · 1 of 1 positions shown · non-contrast
Comparison: None.

CLINICAL DATA: Nasogastric tube placement.

EXAM:
PORTABLE ABDOMEN - 1 VIEW

[abdomen]
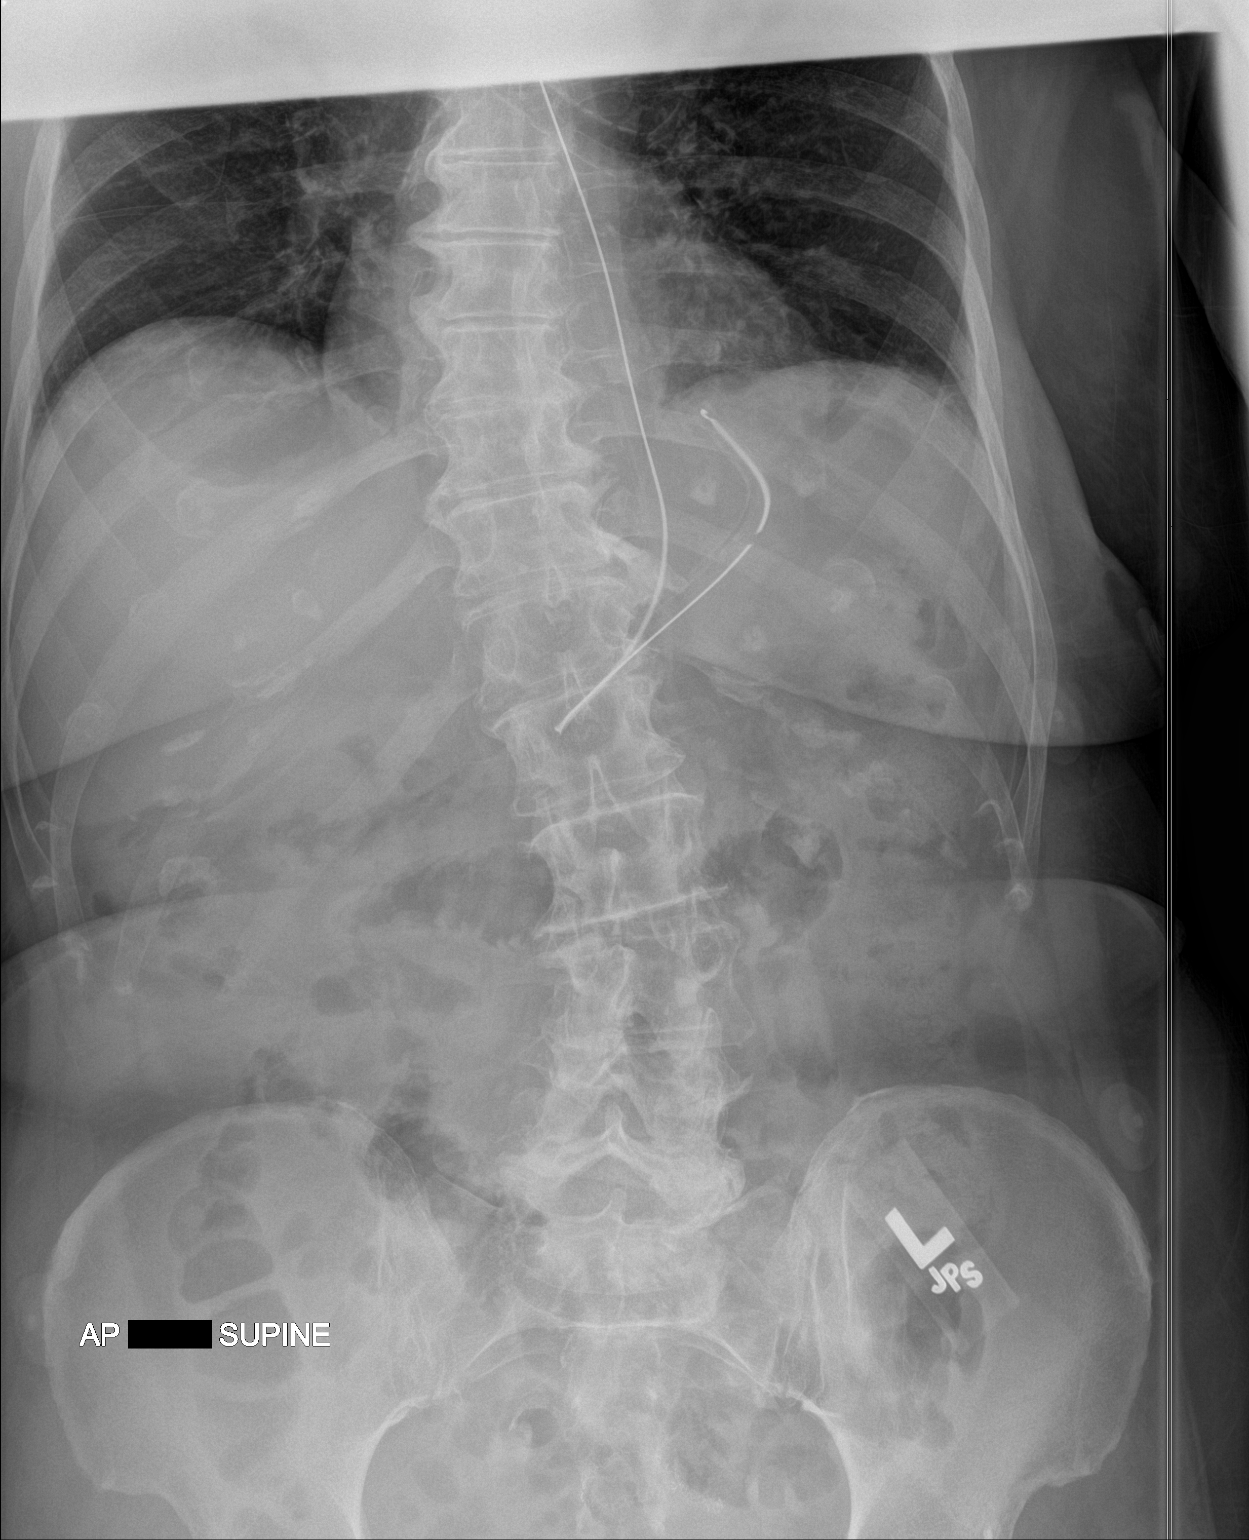

[1 of 1 positions shown; findings below may reference images not displayed]

FINDINGS: The bowel gas pattern is normal. Nasogastric tube is seen looped
within proximal stomach, with distal tip in expected position of
gastric cardia. It appears that a portion of the catheter within the
stomach still may be kinked. No radio-opaque calculi or other
significant radiographic abnormality are seen.
IMPRESSION: Distal portion of nasogastric tube is looped within proximal
stomach. There is still seen probable kink involving the nasogastric
tube.

## 2020-12-21 ENCOUNTER — Encounter: Payer: Self-pay | Admitting: Neurology

## 2020-12-21 ENCOUNTER — Ambulatory Visit: Payer: Medicare HMO | Admitting: Neurology

## 2020-12-21 ENCOUNTER — Other Ambulatory Visit (INDEPENDENT_AMBULATORY_CARE_PROVIDER_SITE_OTHER): Payer: Medicare HMO

## 2020-12-21 ENCOUNTER — Other Ambulatory Visit: Payer: Self-pay

## 2020-12-21 VITALS — BP 145/97 | HR 69 | Ht 60.0 in | Wt 123.0 lb

## 2020-12-21 DIAGNOSIS — G2581 Restless legs syndrome: Secondary | ICD-10-CM

## 2020-12-21 DIAGNOSIS — G510 Bell's palsy: Secondary | ICD-10-CM | POA: Diagnosis not present

## 2020-12-21 LAB — FERRITIN: Ferritin: 45.6 ng/mL (ref 10.0–291.0)

## 2020-12-21 MED ORDER — GABAPENTIN 100 MG PO CAPS: 200.0000 mg | ORAL_CAPSULE | Freq: Every day | ORAL | 3 refills | Status: DC

## 2020-12-21 NOTE — Progress Notes (Signed)
Follow-up Visit   Date: 12/21/20   [name] MRN: [id number] DOB: 1940/12/22   Interim History: [name] is a 80 y.o. right-handed Caucasian female with hypertension and celiac disease returning to the clinic for follow-up of left Bell's palsy and bilateral leg pain.  The patient was accompanied to the clinic by self.   History of present illness: She was talking to her son on video call on 08/01/2020 and noticed that her left eye was droopy.  He called his sister in [place], who came and took patient to the ER. CT head did not show any abnormality.  Her exam showed weakness involving the left upper and lower face, suggestive of Bell's Palsy. She was started on prednisone and valacyclovir, which she completed.  She feels that her left eyelid is getting a little stronger.  She is compliant with patching her eye at night time. No problems with speech, drooling, or swallow.  No facial numbness or vision changes.    For the past year, she has been having burning sensation in the soles and lower legs, which only occurs when she is sleeping.  She tried walking last night and felt that that her discomfort subsided.  No numbness, imbalance, or limb weakness.   She lives at home with husband in a one-level home.  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  She has two grown children.   UPDATE 12/21/2020:  She is here for follow-up visit. Her left facial weakness improved within 3 weeks, she denies any eye irritation or abnormal movements.  She also reports significant relief of pain and leg movements on gabapentin 200mg  at bedtime.  She is sleeping much better. No new complaints.   Medications:  Current Outpatient Medications on File Prior to Visit  Medication Sig Dispense Refill   amLODipine (NORVASC) 5 MG tablet Take 5 mg by mouth daily.      cycloSPORINE (RESTASIS) 0.05 % ophthalmic emulsion Place 1 drop into both eyes 2 (two) times daily.     losartan (COZAAR) 100 MG tablet  Take 100 mg by mouth daily.      metoprolol succinate (TOPROL-XL) 50 MG 24 hr tablet Take 50 mg by mouth every evening.      omeprazole (PRILOSEC) 40 MG capsule Take 40 mg by mouth daily.     ondansetron (ZOFRAN) 4 MG tablet Take 1 tablet (4 mg total) by mouth every 6 (six) hours. 12 tablet 0   valACYclovir (VALTREX) 1000 MG tablet Take 1 tablet (1,000 mg total) by mouth 3 (three) times daily. 21 tablet 0   No current facility-administered medications on file prior to visit.    Allergies:  Allergies  Allergen Reactions   Gluten Meal     Diarrhea, vomiting that lasts 3-4 hours   Ace Inhibitors     Coughing   Ciprofloxacin     Joint pain   Ibuprofen     Joint pain   Levofloxacin     Pain in tendons   Simvastatin Other (See Comments)    Muscle pain    Vital Signs:  BP (!) 145/97   Pulse 69   Ht 5' (1.524 m)   Wt 123 lb (55.8 kg)   SpO2 100%   BMI 24.02 kg/m   Neurological Exam: MENTAL STATUS including orientation to time, place, person, recent and remote memory, attention span and concentration, language, and fund of knowledge is normal.  Speech is not dysarthric.  CRANIAL NERVES:  No visual field  defects.  Pupils equal round and reactive to light.  Normal conjugate, extra-ocular eye movements in all directions of gaze.  No ptosis.  Trace flattening of the left nasolabial fold, otherwise face is symmetric. Palate elevates symmetrically.  Tongue is midline.  MOTOR:  Motor strength is 5/5 in all extremities.  No atrophy, fasciculations or abnormal movements.  No pronator drift.  Tone is normal.    MSRs:  Reflexes are 2+/4 throughout.  SENSORY:  Intact to vibration throughout.  COORDINATION/GAIT:  Normal finger-to- nose-finger.  Intact rapid alternating movements bilaterally.  Gait narrow based and stable.   Data: NCS/EMG of the legs 09/15/2020: Normal  IMPRESSION/PLAN: Left Bell's palsy (07/2020), resolved Bilateral leg paresthesias most suggestive of restless leg  syndrome. NCS/EMG was normal, no signs of neuropathy or lumbosacral radiculopathy - Check ferritin - Refilled gabapentin 200mg  at bedtime, which controls symptoms  Return to clinic in 1 year.    Thank you for allowing me to participate in patient's care.  If I can answer any additional questions, I would be pleased to do so.    Sincerely,    Temprence Rhines K. Allena Katz, DO

## 2020-12-21 NOTE — Patient Instructions (Addendum)
Check ferritin  Continue gabapentin 236m at bedtime  Return to clinic 1 year

## 2020-12-28 NOTE — Progress Notes (Signed)
Mail box is full, unable to  leave message, letter within normal limits mailed.

## 2021-02-23 DIAGNOSIS — I10 Essential (primary) hypertension: Secondary | ICD-10-CM | POA: Diagnosis not present

## 2021-02-23 DIAGNOSIS — E559 Vitamin D deficiency, unspecified: Secondary | ICD-10-CM | POA: Diagnosis not present

## 2021-02-23 DIAGNOSIS — E782 Mixed hyperlipidemia: Secondary | ICD-10-CM | POA: Diagnosis not present

## 2021-02-23 DIAGNOSIS — R7303 Prediabetes: Secondary | ICD-10-CM | POA: Diagnosis not present

## 2021-02-23 DIAGNOSIS — E538 Deficiency of other specified B group vitamins: Secondary | ICD-10-CM | POA: Diagnosis not present

## 2021-03-04 DIAGNOSIS — K219 Gastro-esophageal reflux disease without esophagitis: Secondary | ICD-10-CM | POA: Diagnosis not present

## 2021-03-04 DIAGNOSIS — J309 Allergic rhinitis, unspecified: Secondary | ICD-10-CM | POA: Diagnosis not present

## 2021-03-04 DIAGNOSIS — I700 Atherosclerosis of aorta: Secondary | ICD-10-CM | POA: Diagnosis not present

## 2021-03-04 DIAGNOSIS — M810 Age-related osteoporosis without current pathological fracture: Secondary | ICD-10-CM | POA: Diagnosis not present

## 2021-03-04 DIAGNOSIS — K900 Celiac disease: Secondary | ICD-10-CM | POA: Diagnosis not present

## 2021-03-04 DIAGNOSIS — R7303 Prediabetes: Secondary | ICD-10-CM | POA: Diagnosis not present

## 2021-03-04 DIAGNOSIS — I10 Essential (primary) hypertension: Secondary | ICD-10-CM | POA: Diagnosis not present

## 2021-03-04 DIAGNOSIS — G2581 Restless legs syndrome: Secondary | ICD-10-CM | POA: Diagnosis not present

## 2021-03-04 DIAGNOSIS — E782 Mixed hyperlipidemia: Secondary | ICD-10-CM | POA: Diagnosis not present

## 2021-03-16 ENCOUNTER — Ambulatory Visit: Payer: Medicare HMO

## 2021-03-19 DIAGNOSIS — H903 Sensorineural hearing loss, bilateral: Secondary | ICD-10-CM | POA: Diagnosis not present

## 2021-03-22 ENCOUNTER — Ambulatory Visit
Admission: RE | Admit: 2021-03-22 | Discharge: 2021-03-22 | Disposition: A | Discharge status: Home / Self Care | Payer: Medicare HMO | Source: Ambulatory Visit | Attending: Family Medicine | Admitting: Family Medicine

## 2021-03-22 DIAGNOSIS — Z1231 Encounter for screening mammogram for malignant neoplasm of breast: Secondary | ICD-10-CM | POA: Diagnosis not present

## 2021-04-02 DIAGNOSIS — R051 Acute cough: Secondary | ICD-10-CM | POA: Diagnosis not present

## 2021-04-14 ENCOUNTER — Other Ambulatory Visit: Payer: Self-pay | Admitting: Neurology

## 2021-06-21 ENCOUNTER — Other Ambulatory Visit: Payer: Self-pay | Admitting: Neurology

## 2021-08-21 ENCOUNTER — Other Ambulatory Visit: Payer: Self-pay | Admitting: Neurology

## 2021-08-26 DIAGNOSIS — R7309 Other abnormal glucose: Secondary | ICD-10-CM | POA: Diagnosis not present

## 2021-08-26 DIAGNOSIS — R7303 Prediabetes: Secondary | ICD-10-CM | POA: Diagnosis not present

## 2021-08-26 DIAGNOSIS — E538 Deficiency of other specified B group vitamins: Secondary | ICD-10-CM | POA: Diagnosis not present

## 2021-08-26 DIAGNOSIS — Z79899 Other long term (current) drug therapy: Secondary | ICD-10-CM | POA: Diagnosis not present

## 2021-08-31 DIAGNOSIS — G2581 Restless legs syndrome: Secondary | ICD-10-CM | POA: Diagnosis not present

## 2021-08-31 DIAGNOSIS — Z862 Personal history of diseases of the blood and blood-forming organs and certain disorders involving the immune mechanism: Secondary | ICD-10-CM | POA: Diagnosis not present

## 2021-08-31 DIAGNOSIS — I10 Essential (primary) hypertension: Secondary | ICD-10-CM | POA: Diagnosis not present

## 2021-08-31 DIAGNOSIS — Z23 Encounter for immunization: Secondary | ICD-10-CM | POA: Diagnosis not present

## 2021-08-31 DIAGNOSIS — Z6824 Body mass index (BMI) 24.0-24.9, adult: Secondary | ICD-10-CM | POA: Diagnosis not present

## 2021-08-31 DIAGNOSIS — M810 Age-related osteoporosis without current pathological fracture: Secondary | ICD-10-CM | POA: Diagnosis not present

## 2021-08-31 DIAGNOSIS — I959 Hypotension, unspecified: Secondary | ICD-10-CM | POA: Diagnosis not present

## 2021-08-31 DIAGNOSIS — K219 Gastro-esophageal reflux disease without esophagitis: Secondary | ICD-10-CM | POA: Diagnosis not present

## 2021-08-31 DIAGNOSIS — R7309 Other abnormal glucose: Secondary | ICD-10-CM | POA: Diagnosis not present

## 2021-09-14 ENCOUNTER — Telehealth: Payer: Self-pay | Admitting: Neurology

## 2021-09-14 NOTE — Telephone Encounter (Signed)
1. Which medications need refilled? (List name and dosage, if known) gabapentin 100 Mg   2. Which pharmacy/location is medication to be sent to? (include street and city if Management consultant) [facility] on [address]  3. Do they need a 30 day or 90 day supply? 90  Patient called and is going out of town this Friday and needs to pick up refills before then.

## 2021-09-15 MED ORDER — GABAPENTIN 100 MG PO CAPS: 200.0000 mg | ORAL_CAPSULE | Freq: Two times a day (BID) | ORAL | 1 refills | Status: DC

## 2021-09-15 NOTE — Telephone Encounter (Signed)
Please let pt know that I have sent a new Rx with her taking gabapentin 162m - take 2 tablet twice daily.  This will allow her to have extra for travel.  No need to increase the dose, if 2081mat bedtime is controlling her symptoms.

## 2021-09-15 NOTE — Telephone Encounter (Signed)
Refills for gabapentin has already been sent to her pharmacy on 08/23/21 with refills.  Called patient and she stated that she is going out of town next week and won't be in town to get her refill of gabapentin. Patient is wondering if there is anything that Dr. Posey Pronto can do so she can get her medication early? She is due for a refill next week and leaving town on Saturday so she would like to have enough Gabapentin to last her on her trip. Patient wanted to know if refills could be done for 90 days instead of 30 days.  I informed patient that I will reach out to Dr. Posey Pronto and give her a call back.

## 2021-09-15 NOTE — Telephone Encounter (Signed)
Called patient and informed her that her prescription for Gabapentin has been sent and directions. Patient verbalized understanding and thanked [place] for the help and call back.

## 2021-11-16 DIAGNOSIS — Z6825 Body mass index (BMI) 25.0-25.9, adult: Secondary | ICD-10-CM | POA: Diagnosis not present

## 2021-11-16 DIAGNOSIS — Z23 Encounter for immunization: Secondary | ICD-10-CM | POA: Diagnosis not present

## 2021-11-16 DIAGNOSIS — Z0000 Encounter for general adult medical examination without abnormal findings: Secondary | ICD-10-CM | POA: Diagnosis not present

## 2021-11-16 DIAGNOSIS — Z1389 Encounter for screening for other disorder: Secondary | ICD-10-CM | POA: Diagnosis not present

## 2021-11-25 DIAGNOSIS — Z6824 Body mass index (BMI) 24.0-24.9, adult: Secondary | ICD-10-CM | POA: Diagnosis not present

## 2021-11-25 DIAGNOSIS — B028 Zoster with other complications: Secondary | ICD-10-CM | POA: Diagnosis not present

## 2021-12-22 DIAGNOSIS — J069 Acute upper respiratory infection, unspecified: Secondary | ICD-10-CM | POA: Diagnosis not present

## 2021-12-22 DIAGNOSIS — B370 Candidal stomatitis: Secondary | ICD-10-CM | POA: Diagnosis not present

## 2021-12-22 DIAGNOSIS — R058 Other specified cough: Secondary | ICD-10-CM | POA: Diagnosis not present

## 2021-12-22 DIAGNOSIS — Z6824 Body mass index (BMI) 24.0-24.9, adult: Secondary | ICD-10-CM | POA: Diagnosis not present

## 2021-12-24 ENCOUNTER — Ambulatory Visit: Payer: Medicare HMO | Admitting: Neurology

## 2021-12-30 DIAGNOSIS — R5383 Other fatigue: Secondary | ICD-10-CM | POA: Diagnosis not present

## 2021-12-30 DIAGNOSIS — B338 Other specified viral diseases: Secondary | ICD-10-CM | POA: Diagnosis not present

## 2021-12-30 DIAGNOSIS — Z6824 Body mass index (BMI) 24.0-24.9, adult: Secondary | ICD-10-CM | POA: Diagnosis not present

## 2022-01-09 ENCOUNTER — Encounter (HOSPITAL_COMMUNITY): Payer: Self-pay | Admitting: Emergency Medicine

## 2022-01-09 ENCOUNTER — Emergency Department (HOSPITAL_COMMUNITY)
Admission: EM | Admit: 2022-01-09 | Discharge: 2022-01-09 | Disposition: A | Discharge status: Home / Self Care | Payer: Medicare HMO | Attending: Emergency Medicine | Admitting: Emergency Medicine

## 2022-01-09 DIAGNOSIS — R112 Nausea with vomiting, unspecified: Secondary | ICD-10-CM | POA: Diagnosis not present

## 2022-01-09 DIAGNOSIS — R1111 Vomiting without nausea: Secondary | ICD-10-CM | POA: Diagnosis not present

## 2022-01-09 DIAGNOSIS — R011 Cardiac murmur, unspecified: Secondary | ICD-10-CM | POA: Diagnosis not present

## 2022-01-09 DIAGNOSIS — R1084 Generalized abdominal pain: Secondary | ICD-10-CM | POA: Diagnosis not present

## 2022-01-09 DIAGNOSIS — R197 Diarrhea, unspecified: Secondary | ICD-10-CM | POA: Insufficient documentation

## 2022-01-09 DIAGNOSIS — I10 Essential (primary) hypertension: Secondary | ICD-10-CM | POA: Insufficient documentation

## 2022-01-09 LAB — URINALYSIS, ROUTINE W REFLEX MICROSCOPIC
Bilirubin Urine: NEGATIVE
Glucose, UA: NEGATIVE mg/dL
Hgb urine dipstick: NEGATIVE
Ketones, ur: NEGATIVE mg/dL
Nitrite: NEGATIVE
Protein, ur: NEGATIVE mg/dL
Specific Gravity, Urine: 1.018 (ref 1.005–1.030)
pH: 5 (ref 5.0–8.0)

## 2022-01-09 LAB — COMPREHENSIVE METABOLIC PANEL WITH GFR
ALT: 16 U/L (ref 0–44)
AST: 25 U/L (ref 15–41)
Albumin: 4.2 g/dL (ref 3.5–5.0)
Alkaline Phosphatase: 96 U/L (ref 38–126)
Anion gap: 11 (ref 5–15)
BUN: 26 mg/dL — ABNORMAL HIGH (ref 8–23)
CO2: 26 mmol/L (ref 22–32)
Calcium: 9.2 mg/dL (ref 8.9–10.3)
Chloride: 103 mmol/L (ref 98–111)
Creatinine, Ser: 0.87 mg/dL (ref 0.44–1.00)
GFR, Estimated: >60 mL/min
Glucose, Bld: 169 mg/dL — ABNORMAL HIGH (ref 70–99)
Potassium: 3.2 mmol/L — ABNORMAL LOW (ref 3.5–5.1)
Sodium: 140 mmol/L (ref 135–145)
Total Bilirubin: 1 mg/dL (ref 0.3–1.2)
Total Protein: 7.4 g/dL (ref 6.5–8.1)

## 2022-01-09 LAB — CBC
HCT: 40.9 % (ref 36.0–46.0)
Hemoglobin: 13.3 g/dL (ref 12.0–15.0)
MCH: 30.2 pg (ref 26.0–34.0)
MCHC: 32.5 g/dL (ref 30.0–36.0)
MCV: 92.7 fL (ref 80.0–100.0)
Platelets: 279 K/uL (ref 150–400)
RBC: 4.41 MIL/uL (ref 3.87–5.11)
RDW: 14.8 % (ref 11.5–15.5)
WBC: 16.8 K/uL — ABNORMAL HIGH (ref 4.0–10.5)
nRBC: 0 % (ref 0.0–0.2)

## 2022-01-09 LAB — LIPASE, BLOOD: Lipase: 28 U/L (ref 11–51)

## 2022-01-09 MED ORDER — ONDANSETRON 8 MG PO TBDP: 8.0000 mg | ORAL_TABLET | Freq: Three times a day (TID) | ORAL | 0 refills | Status: DC | PRN

## 2022-01-09 MED ORDER — SODIUM CHLORIDE 0.9 % IV BOLUS
1000.0000 mL | Freq: Once | INTRAVENOUS | Status: AC
  Administered 2022-01-09: 1000 mL via INTRAVENOUS

## 2022-01-09 NOTE — ED Notes (Signed)
Pt given water 

## 2022-01-09 NOTE — ED Triage Notes (Signed)
Pt arriving via [facility] for n/v/d that began last night after eating at a [employer] in [place]. Pt received 79m IV Zofran via EMS and n/v has resolved.

## 2022-01-09 NOTE — Discharge Instructions (Addendum)
Plenty of fluids.  Take the Zofran every 8 hours as needed for nausea and vomiting.  See your doctor tomorrow if you are still having abdominal pain or return to the ED if unable to be seen by your primary.

## 2022-01-09 NOTE — ED Provider Notes (Signed)
[facility] DEPT Provider Note   CSN: [id number] Arrival date & time: 01/09/22  8016     History  Chief Complaint  Patient presents with   Nausea   Diarrhea    [name] is a 81 y.o. female.   Diarrhea    Patient with medical history of hypertension, hyperlipidemia, GERD, celiac disease presents the emergency department due to vomiting and diarrhea.  Happened acutely after eating at a [employer] at 9 PM yesterday [place].  States she has been having emesis as well as diarrhea.  No blood in either.  History of the same before she was diagnosed with celiac many years ago.  She is having generalized abdominal pain which is worse with any emesis or diarrhea.  Denies any chest pain or shortness of breath.  Was given Zofran by EMS and route which helped.  Patient was also hypertensive at home, she is on medication for it and missed doses.  Home Medications Prior to Admission medications   Medication Sig Start Date End Date Taking? Authorizing Provider  ondansetron (ZOFRAN-ODT) 8 MG disintegrating tablet Take 1 tablet (8 mg total) by mouth every 8 (eight) hours as needed for nausea or vomiting. 01/09/22  Yes Sherrill Raring, PA-C  amLODipine (NORVASC) 5 MG tablet Take 5 mg by mouth daily.     [provider]  cycloSPORINE (RESTASIS) 0.05 % ophthalmic emulsion Place 1 drop into both eyes 2 (two) times daily.    [provider]  gabapentin (NEURONTIN) 100 MG capsule Take 2 capsules (200 mg total) by mouth 2 (two) times daily. 09/15/21   Patel, Arvin Collard K, DO  losartan (COZAAR) 100 MG tablet Take 100 mg by mouth daily.     [provider]  metoprolol succinate (TOPROL-XL) 50 MG 24 hr tablet Take 50 mg by mouth every evening.     [provider]  omeprazole (PRILOSEC) 40 MG capsule Take 40 mg by mouth daily. 03/24/18   [provider]  ondansetron (ZOFRAN) 4 MG tablet Take 1 tablet (4 mg total) by mouth  every 6 (six) hours. 04/28/18   Roxan Hockey, MD  valACYclovir (VALTREX) 1000 MG tablet Take 1 tablet (1,000 mg total) by mouth 3 (three) times daily. 08/02/20   Luna Fuse, MD      Allergies    Gluten meal, Ace inhibitors, Ciprofloxacin, Ibuprofen, Levofloxacin, and Simvastatin    Review of Systems   Review of Systems  Gastrointestinal:  Positive for diarrhea.    Physical Exam Updated Vital Signs BP (!) 124/59 (BP Location: Right Arm)   Pulse 82   Temp 98 F (36.7 C) (Oral)   Resp 16   SpO2 99%  Physical Exam Vitals and nursing note reviewed. Exam conducted with a chaperone present.  Constitutional:      Appearance: Normal appearance.  HENT:     Head: Normocephalic and atraumatic.  Eyes:     General: No scleral icterus.       Right eye: No discharge.        Left eye: No discharge.     Extraocular Movements: Extraocular movements intact.     Pupils: Pupils are equal, round, and reactive to light.  Cardiovascular:     Rate and Rhythm: Normal rate and regular rhythm.     Pulses: Normal pulses.     Heart sounds: Murmur heard.     No friction rub. No gallop.  Pulmonary:     Effort: Pulmonary effort is normal. No  respiratory distress.     Breath sounds: Normal breath sounds.  Abdominal:     General: Abdomen is flat. Bowel sounds are normal. There is no distension.     Palpations: Abdomen is soft.     Tenderness: There is abdominal tenderness.     Comments: Positive bowel sounds.  Generalized abdominal tenderness without rigidity or guarding  Skin:    General: Skin is warm and dry.     Coloration: Skin is not jaundiced.  Neurological:     Mental Status: She is alert. Mental status is at baseline.     Coordination: Coordination normal.     ED Results / Procedures / Treatments   Labs (all labs ordered are listed, but only abnormal results are displayed) Labs Reviewed  COMPREHENSIVE METABOLIC PANEL - Abnormal; Notable for the following components:      Result  Value   Potassium 3.2 (*)    Glucose, Bld 169 (*)    BUN 26 (*)    All other components within normal limits  CBC - Abnormal; Notable for the following components:   WBC 16.8 (*)    All other components within normal limits  URINALYSIS, ROUTINE W REFLEX MICROSCOPIC - Abnormal; Notable for the following components:   Leukocytes,Ua SMALL (*)    Bacteria, UA RARE (*)    All other components within normal limits  LIPASE, BLOOD    EKG None  Radiology No results found.  Procedures Procedures    Medications Ordered in ED Medications  sodium chloride 0.9 % bolus 1,000 mL (0 mLs Intravenous Stopped 01/09/22 1150)    ED Course/ Medical Decision Making/ A&P                           Medical Decision Making Amount and/or Complexity of Data Reviewed Labs: ordered.  Risk Prescription drug management.   Patient presents to the emergency department due to nausea, vomiting and diarrhea.  Differential includes but not limited to infection, AKI, metabolic abnormality or deficiency, dehydration.  Patient's daughter is at bedside providing independent history.  Patient symptoms are most likely consistent with exposure to include celiac and acuity of symptoms.  Zofran was ordered and given by EMS because helped with her symptoms, will give liter fluid bolus, check basic labs and reassess.  She is generalized abdominal tenderness on exam without rigidity or focality, abdomen is nonperitoneal so we will hold off on CT at this time.  I ordered, viewed and interpreted laboratory workup. CBC with slight leukocytosis of 16.8. CMP with mild hypokalemia with potassium of 3.2.  Slightly hyperglycemic, no transaminitis or AKI. Lipase is within normal limits. UA without any signs of UTI.  Repeat abdominal exam is benign.  On reevaluation patient feels improved.  She is tolerating p.o.  I considered getting a CT scan of her abdomen.  Given her pain is improved and the acuity of symptoms I really  do feel this is most likely reactive from exposure.  Reengaged shared decision making patient would prefer not to proceed with CT scan.  But her workup is overall reassuring without any signs of AKI or metabolic abnormality.  She has a leukocytosis which is probably reactive from emesis and diarrhea.  Given patient is tolerating p.o., feels improved will have her follow-up with her primary in the next 2 days for reevaluation.  Return close discussed with patient verbalized understanding.  Stable for discharge at this time.  Discussed HPI, physical exam and plan  of care for this patient with attending Marda Stalker. The attending physician evaluated this patient as part of a shared visit and agrees with plan of care.         Final Clinical Impression(s) / ED Diagnoses Final diagnoses:  Nausea vomiting and diarrhea    Rx / DC Orders ED Discharge Orders          Ordered    ondansetron (ZOFRAN-ODT) 8 MG disintegrating tablet  Every 8 hours PRN        01/09/22 1137              Sherrill Raring, [place] 01/09/22 1414    Tegeler, Gwenyth Allegra, MD 01/09/22 1425

## 2022-01-13 DIAGNOSIS — Z6824 Body mass index (BMI) 24.0-24.9, adult: Secondary | ICD-10-CM | POA: Diagnosis not present

## 2022-01-13 DIAGNOSIS — I10 Essential (primary) hypertension: Secondary | ICD-10-CM | POA: Diagnosis not present

## 2022-01-13 DIAGNOSIS — Z8619 Personal history of other infectious and parasitic diseases: Secondary | ICD-10-CM | POA: Diagnosis not present

## 2022-01-13 DIAGNOSIS — R112 Nausea with vomiting, unspecified: Secondary | ICD-10-CM | POA: Diagnosis not present

## 2022-02-15 DIAGNOSIS — R7309 Other abnormal glucose: Secondary | ICD-10-CM | POA: Diagnosis not present

## 2022-02-15 DIAGNOSIS — M810 Age-related osteoporosis without current pathological fracture: Secondary | ICD-10-CM | POA: Diagnosis not present

## 2022-02-21 ENCOUNTER — Other Ambulatory Visit: Payer: Self-pay | Admitting: Family Medicine

## 2022-02-21 DIAGNOSIS — R7303 Prediabetes: Secondary | ICD-10-CM | POA: Diagnosis not present

## 2022-02-21 DIAGNOSIS — I700 Atherosclerosis of aorta: Secondary | ICD-10-CM | POA: Diagnosis not present

## 2022-02-21 DIAGNOSIS — Z1231 Encounter for screening mammogram for malignant neoplasm of breast: Secondary | ICD-10-CM

## 2022-02-21 DIAGNOSIS — K219 Gastro-esophageal reflux disease without esophagitis: Secondary | ICD-10-CM | POA: Diagnosis not present

## 2022-02-21 DIAGNOSIS — G2581 Restless legs syndrome: Secondary | ICD-10-CM | POA: Diagnosis not present

## 2022-02-21 DIAGNOSIS — I10 Essential (primary) hypertension: Secondary | ICD-10-CM | POA: Diagnosis not present

## 2022-02-21 DIAGNOSIS — M810 Age-related osteoporosis without current pathological fracture: Secondary | ICD-10-CM | POA: Diagnosis not present

## 2022-02-22 ENCOUNTER — Encounter: Payer: Self-pay | Admitting: Neurology

## 2022-02-22 ENCOUNTER — Ambulatory Visit: Payer: Medicare HMO | Admitting: Neurology

## 2022-02-22 VITALS — BP 151/77 | HR 79 | Ht 60.0 in | Wt 127.0 lb

## 2022-02-22 DIAGNOSIS — G2581 Restless legs syndrome: Secondary | ICD-10-CM | POA: Diagnosis not present

## 2022-02-22 MED ORDER — GABAPENTIN 100 MG PO CAPS: 200.0000 mg | ORAL_CAPSULE | Freq: Two times a day (BID) | ORAL | 3 refills | Status: DC

## 2022-02-22 NOTE — Patient Instructions (Signed)
It was great to see you today!  I will see you back in 1 year

## 2022-02-22 NOTE — Progress Notes (Signed)
Follow-up Visit   Date: 02/22/22   [name] MRN: [id number] DOB: 1940/06/23   Interim History: [name] is a 82 y.o. right-handed Caucasian female with hypertension and celiac disease returning to the clinic for follow-up of left Bell's palsy and bilateral leg pain.  The patient was accompanied to the clinic by self.  IMPRESSION/PLAN: Restless leg syndrome, controlled - Continue gabapentin 272m at bedtime  Left Bell's palsy, resolved  Return to clinic in 1 year  ----------------------------------------------------------------------------- History of present illness: She was talking to her son on video call on 08/01/2020 and noticed that her left eye was droopy.  He called his sister in [place] who came and took patient to the ER. CT head did not show any abnormality.  Her exam showed weakness involving the left upper and lower face, suggestive of Bell's Palsy. She was started on prednisone and valacyclovir, which she completed.  She feels that her left eyelid is getting a little stronger.  She is compliant with patching her eye at night time. No problems with speech, drooling, or swallow.  No facial numbness or vision changes.    For the past year, she has been having burning sensation in the soles and lower legs, which only occurs when she is sleeping.  She tried walking last night and felt that that her discomfort subsided.  No numbness, imbalance, or limb weakness.   She lives at home with husband in a one-level home.  She retired from [employer]'s office.  She has two grown children.   UPDATE 12/21/2020:  She is here for follow-up visit. Her left facial weakness improved within 3 weeks, she denies any eye irritation or abnormal movements.  She also reports significant relief of pain and leg movements on gabapentin 2019mat bedtime.  She is sleeping much better. No new complaints.  UPDATE 02/22/2022: She is here for follow-up visit.  The past year has been  difficulty as her husband passed suddenly in December 2022 and she has been overcoming viral illness. She has good family support and has been managing well.  Her restless sensation of the legs is well-controlled on gabapentin 20036mt bedtime.  She is requesting refills.  No new complaints.   Medications:  Current Outpatient Medications on File Prior to Visit  Medication Sig Dispense Refill   amLODipine (NORVASC) 5 MG tablet Take 5 mg by mouth daily.      capsaicin (ZOSTRIX) 0.025 % cream Apply topically 2 (two) times daily.     Cholecalciferol 50 MCG (2000 UT) CAPS 1 capsule Orally Once a day for 90 days     Cyanocobalamin 1000 MCG SUBL 1 tab Sublingual Once a day     cycloSPORINE (RESTASIS) 0.05 % ophthalmic emulsion Place 1 drop into both eyes 2 (two) times daily.     famotidine (PEPCID) 10 MG tablet 1 tablet at bedtime as needed Orally Once a day     losartan (COZAAR) 100 MG tablet Take 100 mg by mouth daily.      metoprolol succinate (TOPROL-XL) 50 MG 24 hr tablet Take 50 mg by mouth every evening.      omeprazole (PRILOSEC) 40 MG capsule Take 40 mg by mouth daily.     ondansetron (ZOFRAN) 4 MG tablet Take 1 tablet (4 mg total) by mouth every 6 (six) hours. 12 tablet 0   ondansetron (ZOFRAN-ODT) 8 MG disintegrating tablet Take 1 tablet (8 mg total) by mouth every 8 (eight) hours as needed for  nausea or vomiting. 20 tablet 0   Oyster Shell Calcium 500 MG TABS 1 tablet with meals Orally Twice a day     valACYclovir (VALTREX) 1000 MG tablet Take 1 tablet (1,000 mg total) by mouth 3 (three) times daily. 21 tablet 0   No current facility-administered medications on file prior to visit.    Allergies:  Allergies  Allergen Reactions   Gluten Meal     Diarrhea, vomiting that lasts 3-4 hours   Ace Inhibitors     Coughing   Ciprofloxacin     Joint pain   Covid-19 (Mrna) Vaccine     Other Reaction(s): Bell's Palsy   Ibuprofen     Joint pain   Levofloxacin     Pain in tendons    Simvastatin Other (See Comments)    Muscle pain    Vital Signs:  BP (!) 151/77   Pulse 79   Ht 5' (1.524 m)   Wt 127 lb (57.6 kg)   SpO2 98%   BMI 24.80 kg/m   Neurological Exam: MENTAL STATUS including orientation to time, place, person, recent and remote memory, attention span and concentration, language, and fund of knowledge is normal.  Speech is not dysarthric.  CRANIAL NERVES:  Pupils equal round and reactive to light.  Normal conjugate, extra-ocular eye movements in all directions of gaze.  No ptosis.  Trace flattening of the left nasolabial fold, otherwise face is symmetric.  MOTOR:  Motor strength is 5/5 in all extremities.  No atrophy, fasciculations or abnormal movements.  No pronator drift.  Tone is normal.    COORDINATION/GAIT:  Normal finger-to- nose-finger.  Intact rapid alternating movements bilaterally.  Gait narrow based and stable.   Data: NCS/EMG of the legs 09/15/2020: Normal    Thank you for allowing me to participate in patient's care.  If I can answer any additional questions, I would be pleased to do so.    Sincerely,    Arish Redner K. Posey Pronto, DO

## 2022-02-23 ENCOUNTER — Other Ambulatory Visit: Payer: Self-pay | Admitting: Family Medicine

## 2022-02-23 DIAGNOSIS — M810 Age-related osteoporosis without current pathological fracture: Secondary | ICD-10-CM

## 2022-04-06 ENCOUNTER — Ambulatory Visit
Admission: RE | Admit: 2022-04-06 | Discharge: 2022-04-06 | Disposition: A | Discharge status: Home / Self Care | Payer: Medicare HMO | Source: Ambulatory Visit | Attending: Family Medicine | Admitting: Family Medicine

## 2022-04-06 DIAGNOSIS — Z1231 Encounter for screening mammogram for malignant neoplasm of breast: Secondary | ICD-10-CM | POA: Diagnosis not present

## 2022-08-19 DIAGNOSIS — I10 Essential (primary) hypertension: Secondary | ICD-10-CM | POA: Diagnosis not present

## 2022-08-19 DIAGNOSIS — R7309 Other abnormal glucose: Secondary | ICD-10-CM | POA: Diagnosis not present

## 2022-08-19 DIAGNOSIS — R7303 Prediabetes: Secondary | ICD-10-CM | POA: Diagnosis not present

## 2022-08-24 DIAGNOSIS — R5383 Other fatigue: Secondary | ICD-10-CM | POA: Diagnosis not present

## 2022-08-24 DIAGNOSIS — G2581 Restless legs syndrome: Secondary | ICD-10-CM | POA: Diagnosis not present

## 2022-08-24 DIAGNOSIS — M65342 Trigger finger, left ring finger: Secondary | ICD-10-CM | POA: Diagnosis not present

## 2022-08-24 DIAGNOSIS — I10 Essential (primary) hypertension: Secondary | ICD-10-CM | POA: Diagnosis not present

## 2022-08-24 DIAGNOSIS — R7303 Prediabetes: Secondary | ICD-10-CM | POA: Diagnosis not present

## 2022-08-24 DIAGNOSIS — G8929 Other chronic pain: Secondary | ICD-10-CM | POA: Diagnosis not present

## 2022-08-24 DIAGNOSIS — M810 Age-related osteoporosis without current pathological fracture: Secondary | ICD-10-CM | POA: Diagnosis not present

## 2022-08-24 DIAGNOSIS — K219 Gastro-esophageal reflux disease without esophagitis: Secondary | ICD-10-CM | POA: Diagnosis not present

## 2022-08-24 DIAGNOSIS — M25561 Pain in right knee: Secondary | ICD-10-CM | POA: Diagnosis not present

## 2022-09-08 ENCOUNTER — Ambulatory Visit
Admission: RE | Admit: 2022-09-08 | Discharge: 2022-09-08 | Disposition: A | Discharge status: Home / Self Care | Payer: Medicare HMO | Source: Ambulatory Visit | Attending: Family Medicine | Admitting: Family Medicine

## 2022-09-08 DIAGNOSIS — M8588 Other specified disorders of bone density and structure, other site: Secondary | ICD-10-CM | POA: Diagnosis not present

## 2022-09-08 DIAGNOSIS — N958 Other specified menopausal and perimenopausal disorders: Secondary | ICD-10-CM | POA: Diagnosis not present

## 2022-09-08 DIAGNOSIS — M810 Age-related osteoporosis without current pathological fracture: Secondary | ICD-10-CM

## 2022-09-08 DIAGNOSIS — R29890 Loss of height: Secondary | ICD-10-CM | POA: Diagnosis not present

## 2022-09-08 DIAGNOSIS — E349 Endocrine disorder, unspecified: Secondary | ICD-10-CM | POA: Diagnosis not present

## 2022-09-19 DIAGNOSIS — M1711 Unilateral primary osteoarthritis, right knee: Secondary | ICD-10-CM | POA: Diagnosis not present

## 2022-09-19 DIAGNOSIS — M25461 Effusion, right knee: Secondary | ICD-10-CM | POA: Diagnosis not present

## 2022-09-21 DIAGNOSIS — Z23 Encounter for immunization: Secondary | ICD-10-CM | POA: Diagnosis not present

## 2022-09-21 DIAGNOSIS — K219 Gastro-esophageal reflux disease without esophagitis: Secondary | ICD-10-CM | POA: Diagnosis not present

## 2022-09-21 DIAGNOSIS — M810 Age-related osteoporosis without current pathological fracture: Secondary | ICD-10-CM | POA: Diagnosis not present

## 2022-09-21 DIAGNOSIS — Z6824 Body mass index (BMI) 24.0-24.9, adult: Secondary | ICD-10-CM | POA: Diagnosis not present

## 2022-09-30 DIAGNOSIS — M818 Other osteoporosis without current pathological fracture: Secondary | ICD-10-CM | POA: Diagnosis not present

## 2022-09-30 DIAGNOSIS — M25561 Pain in right knee: Secondary | ICD-10-CM | POA: Diagnosis not present

## 2022-10-03 DIAGNOSIS — M818 Other osteoporosis without current pathological fracture: Secondary | ICD-10-CM | POA: Diagnosis not present

## 2022-10-03 DIAGNOSIS — M25561 Pain in right knee: Secondary | ICD-10-CM | POA: Diagnosis not present

## 2022-10-10 DIAGNOSIS — M818 Other osteoporosis without current pathological fracture: Secondary | ICD-10-CM | POA: Diagnosis not present

## 2022-10-10 DIAGNOSIS — M25561 Pain in right knee: Secondary | ICD-10-CM | POA: Diagnosis not present

## 2022-10-14 DIAGNOSIS — M818 Other osteoporosis without current pathological fracture: Secondary | ICD-10-CM | POA: Diagnosis not present

## 2022-10-14 DIAGNOSIS — M25561 Pain in right knee: Secondary | ICD-10-CM | POA: Diagnosis not present

## 2022-10-24 DIAGNOSIS — M1711 Unilateral primary osteoarthritis, right knee: Secondary | ICD-10-CM | POA: Diagnosis not present

## 2022-11-07 DIAGNOSIS — M65342 Trigger finger, left ring finger: Secondary | ICD-10-CM | POA: Diagnosis not present

## 2022-11-21 DIAGNOSIS — Z0000 Encounter for general adult medical examination without abnormal findings: Secondary | ICD-10-CM | POA: Diagnosis not present

## 2022-11-21 DIAGNOSIS — Z6824 Body mass index (BMI) 24.0-24.9, adult: Secondary | ICD-10-CM | POA: Diagnosis not present

## 2022-11-21 DIAGNOSIS — Z1331 Encounter for screening for depression: Secondary | ICD-10-CM | POA: Diagnosis not present

## 2022-11-24 DIAGNOSIS — M1711 Unilateral primary osteoarthritis, right knee: Secondary | ICD-10-CM | POA: Diagnosis not present

## 2022-11-24 DIAGNOSIS — M65342 Trigger finger, left ring finger: Secondary | ICD-10-CM | POA: Diagnosis not present

## 2022-11-24 DIAGNOSIS — M1811 Unilateral primary osteoarthritis of first carpometacarpal joint, right hand: Secondary | ICD-10-CM | POA: Diagnosis not present

## 2023-02-21 ENCOUNTER — Ambulatory Visit: Payer: Medicare Other | Admitting: Neurology

## 2023-02-21 ENCOUNTER — Encounter: Payer: Self-pay | Admitting: Neurology

## 2023-02-21 VITALS — BP 184/72 | HR 68 | Ht 60.0 in | Wt 125.0 lb

## 2023-02-21 DIAGNOSIS — G2581 Restless legs syndrome: Secondary | ICD-10-CM

## 2023-02-21 DIAGNOSIS — I10 Essential (primary) hypertension: Secondary | ICD-10-CM

## 2023-02-21 NOTE — Progress Notes (Signed)
Follow-up Visit   Date: 02/21/23   [name] MRN: [id number] DOB: 09/18/1940   Interim History: [name] is a 83 y.o. right-handed Caucasian female with hypertension and celiac disease returning to the clinic for follow-up of left Bell's palsy and restless leg syndrome.  The patient was accompanied to the clinic by self.  IMPRESSION/PLAN: Restless leg syndrome, controlled - Continue gabapentin 200mg  at bedtime  Left Bell's palsy, resolved  Elevated blood pressure with hypertension.  BP initially 205/78 and rechecked 184/72.  She is asymptomatic.  Recommend that she monitor at home and has follow-up with PCP tomorrow.   Return to clinic in 1 year  ----------------------------------------------------------------------------- History of present illness: She was talking to her son on video call on 08/01/2020 and noticed that her left eye was droopy.  He called his sister in [place], who came and took patient to the ER. CT head did not show any abnormality.  Her exam showed weakness involving the left upper and lower face, suggestive of Bell's Palsy. She was started on prednisone and valacyclovir, which she completed.  She feels that her left eyelid is getting a little stronger.  She is compliant with patching her eye at night time. No problems with speech, drooling, or swallow.  No facial numbness or vision changes.    For the past year, she has been having burning sensation in the soles and lower legs, which only occurs when she is sleeping.  She tried walking last night and felt that that her discomfort subsided.  No numbness, imbalance, or limb weakness.   She lives at home with husband in a one-level home.  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  She has two grown children.   UPDATE 12/21/2020:  She is here for follow-up visit. Her left facial weakness improved within 3 weeks, she denies any eye irritation or abnormal movements.  She also reports significant relief  of pain and leg movements on gabapentin 200mg  at bedtime.  She is sleeping much better. No new complaints.  UPDATE 02/22/2022: She is here for follow-up visit.  The past year has been difficulty as her husband passed suddenly in December 2022 and she has been overcoming viral illness. She has good family support and has been managing well.  Her restless sensation of the legs is well-controlled on gabapentin 200mg  at bedtime.  She is requesting refills.  No new complaints.   UPDATE 02/21/2023:  She is here for follow-up visit.  RLS is well-controlled on gabapentin 200mg  at bedtime.  Her knees have been more bothersome and often keep her awake at night because she cannot get comfortable.  She has received injections which help for a little while, but now is considering surgery.   Medications:  Current Outpatient Medications on File Prior to Visit  Medication Sig Dispense Refill   amLODipine (NORVASC) 5 MG tablet Take 5 mg by mouth daily.      Cholecalciferol 50 MCG (2000 UT) CAPS 1 capsule Orally Once a day for 90 days     Cyanocobalamin 1000 MCG SUBL 1 tab Sublingual Once a day     cycloSPORINE (RESTASIS) 0.05 % ophthalmic emulsion Place 1 drop into both eyes 2 (two) times daily.     famotidine (PEPCID) 10 MG tablet 1 tablet at bedtime as needed Orally Once a day     gabapentin (NEURONTIN) 100 MG capsule Take 2 capsules (200 mg total) by mouth 2 (two) times daily. 180 capsule 3   losartan (  COZAAR) 100 MG tablet Take 100 mg by mouth daily.      metoprolol succinate (TOPROL-XL) 50 MG 24 hr tablet Take 50 mg by mouth every evening.      omeprazole (PRILOSEC) 40 MG capsule Take 40 mg by mouth daily.     ondansetron (ZOFRAN) 4 MG tablet Take 1 tablet (4 mg total) by mouth every 6 (six) hours. 12 tablet 0   ondansetron (ZOFRAN-ODT) 8 MG disintegrating tablet Take 1 tablet (8 mg total) by mouth every 8 (eight) hours as needed for nausea or vomiting. 20 tablet 0   No current facility-administered  medications on file prior to visit.    Allergies:  Allergies  Allergen Reactions   Gluten Meal     Diarrhea, vomiting that lasts 3-4 hours   Ace Inhibitors     Coughing   Ciprofloxacin     Joint pain   Covid-19 (Mrna) Vaccine     Other Reaction(s): Bell's Palsy   Ibuprofen     Joint pain   Levofloxacin     Pain in tendons   Simvastatin Other (See Comments)    Muscle pain    Vital Signs:  BP (!) 190/82   Pulse 68   Ht 5' (1.524 m)   Wt 125 lb (56.7 kg)   SpO2 99%   BMI 24.41 kg/m   Neurological Exam: MENTAL STATUS including orientation to time, place, person, recent and remote memory, attention span and concentration, language, and fund of knowledge is normal.  Speech is not dysarthric.  CRANIAL NERVES:  Pupils equal round and reactive to light.  Normal conjugate, extra-ocular eye movements in all directions of gaze.  No ptosis.  Trace flattening of the left nasolabial fold, otherwise face is symmetric.  MOTOR:  Motor strength is 5/5 in all extremities, except 5-/5 with hip flexion and knee extension due to knee pain.  No atrophy, fasciculations or abnormal movements.  No pronator drift.  Tone is normal.    COORDINATION/GAIT:  Normal finger-to- nose-finger.  Intact rapid alternating movements bilaterally.  Gait narrow based and stable.   Data: NCS/EMG of the legs 09/15/2020: Normal    Thank you for allowing me to participate in patient's care.  If I can answer any additional questions, I would be pleased to do so.    Sincerely,    Beldon Nowling K. Allena Katz, DO

## 2023-02-22 DIAGNOSIS — Z6824 Body mass index (BMI) 24.0-24.9, adult: Secondary | ICD-10-CM | POA: Diagnosis not present

## 2023-02-22 DIAGNOSIS — E782 Mixed hyperlipidemia: Secondary | ICD-10-CM | POA: Diagnosis not present

## 2023-02-22 DIAGNOSIS — I10 Essential (primary) hypertension: Secondary | ICD-10-CM | POA: Diagnosis not present

## 2023-02-22 DIAGNOSIS — Z79899 Other long term (current) drug therapy: Secondary | ICD-10-CM | POA: Diagnosis not present

## 2023-02-22 DIAGNOSIS — M810 Age-related osteoporosis without current pathological fracture: Secondary | ICD-10-CM | POA: Diagnosis not present

## 2023-02-22 DIAGNOSIS — G2581 Restless legs syndrome: Secondary | ICD-10-CM | POA: Diagnosis not present

## 2023-02-22 DIAGNOSIS — R7303 Prediabetes: Secondary | ICD-10-CM | POA: Diagnosis not present

## 2023-02-22 DIAGNOSIS — M79604 Pain in right leg: Secondary | ICD-10-CM | POA: Diagnosis not present

## 2023-02-22 DIAGNOSIS — K219 Gastro-esophageal reflux disease without esophagitis: Secondary | ICD-10-CM | POA: Diagnosis not present

## 2023-02-22 DIAGNOSIS — M79605 Pain in left leg: Secondary | ICD-10-CM | POA: Diagnosis not present

## 2023-03-01 DIAGNOSIS — I10 Essential (primary) hypertension: Secondary | ICD-10-CM | POA: Diagnosis not present

## 2023-03-01 DIAGNOSIS — Z6824 Body mass index (BMI) 24.0-24.9, adult: Secondary | ICD-10-CM | POA: Diagnosis not present

## 2023-03-01 DIAGNOSIS — E538 Deficiency of other specified B group vitamins: Secondary | ICD-10-CM | POA: Diagnosis not present

## 2023-03-08 DIAGNOSIS — E538 Deficiency of other specified B group vitamins: Secondary | ICD-10-CM | POA: Diagnosis not present

## 2023-03-09 DIAGNOSIS — I10 Essential (primary) hypertension: Secondary | ICD-10-CM | POA: Diagnosis not present

## 2023-03-13 ENCOUNTER — Other Ambulatory Visit: Payer: Self-pay | Admitting: Neurology

## 2023-03-15 DIAGNOSIS — E538 Deficiency of other specified B group vitamins: Secondary | ICD-10-CM | POA: Diagnosis not present

## 2023-03-22 DIAGNOSIS — E538 Deficiency of other specified B group vitamins: Secondary | ICD-10-CM | POA: Diagnosis not present

## 2023-03-24 ENCOUNTER — Other Ambulatory Visit: Payer: Self-pay | Admitting: Neurology

## 2023-03-24 MED ORDER — GABAPENTIN 100 MG PO CAPS: 200.0000 mg | ORAL_CAPSULE | Freq: Two times a day (BID) | ORAL | 3 refills | Status: DC

## 2023-03-24 NOTE — Telephone Encounter (Signed)
 Pt said the pharmacy sent in a refill for her gabapentin. It was denied and she doesn't understand why. She said it was supposed to increased so she didn't have to go to pharmacy as often. Please call her to discuss cause she is completely out

## 2023-03-24 NOTE — Telephone Encounter (Signed)
 Called patient and informed her that her Gabapentin has been sent as requested.

## 2023-03-24 NOTE — Telephone Encounter (Signed)
 Called patient and [name] needs a refill on her gabapentin 2 capsules twice daily.  Informed patient I will send to Dr. Allena Katz for approval and give a call back.

## 2023-03-29 DIAGNOSIS — E538 Deficiency of other specified B group vitamins: Secondary | ICD-10-CM | POA: Diagnosis not present

## 2023-04-05 DIAGNOSIS — E538 Deficiency of other specified B group vitamins: Secondary | ICD-10-CM | POA: Diagnosis not present

## 2023-04-05 DIAGNOSIS — I10 Essential (primary) hypertension: Secondary | ICD-10-CM | POA: Diagnosis not present

## 2023-04-12 DIAGNOSIS — E538 Deficiency of other specified B group vitamins: Secondary | ICD-10-CM | POA: Diagnosis not present

## 2023-04-19 DIAGNOSIS — E538 Deficiency of other specified B group vitamins: Secondary | ICD-10-CM | POA: Diagnosis not present

## 2023-08-10 DIAGNOSIS — Z6824 Body mass index (BMI) 24.0-24.9, adult: Secondary | ICD-10-CM | POA: Diagnosis not present

## 2023-08-10 DIAGNOSIS — G2581 Restless legs syndrome: Secondary | ICD-10-CM | POA: Diagnosis not present

## 2023-08-10 DIAGNOSIS — M810 Age-related osteoporosis without current pathological fracture: Secondary | ICD-10-CM | POA: Diagnosis not present

## 2023-08-10 DIAGNOSIS — K219 Gastro-esophageal reflux disease without esophagitis: Secondary | ICD-10-CM | POA: Diagnosis not present

## 2023-08-10 DIAGNOSIS — I10 Essential (primary) hypertension: Secondary | ICD-10-CM | POA: Diagnosis not present

## 2023-08-10 DIAGNOSIS — R7303 Prediabetes: Secondary | ICD-10-CM | POA: Diagnosis not present

## 2023-08-10 DIAGNOSIS — E538 Deficiency of other specified B group vitamins: Secondary | ICD-10-CM | POA: Diagnosis not present

## 2023-08-28 DIAGNOSIS — M25561 Pain in right knee: Secondary | ICD-10-CM | POA: Diagnosis not present

## 2023-09-04 DIAGNOSIS — L309 Dermatitis, unspecified: Secondary | ICD-10-CM | POA: Diagnosis not present

## 2023-09-04 DIAGNOSIS — M25561 Pain in right knee: Secondary | ICD-10-CM | POA: Diagnosis not present

## 2023-09-07 DIAGNOSIS — M25561 Pain in right knee: Secondary | ICD-10-CM | POA: Diagnosis not present

## 2023-09-28 DIAGNOSIS — R197 Diarrhea, unspecified: Secondary | ICD-10-CM | POA: Diagnosis not present

## 2023-09-28 DIAGNOSIS — R109 Unspecified abdominal pain: Secondary | ICD-10-CM | POA: Diagnosis not present

## 2023-09-28 DIAGNOSIS — R1030 Lower abdominal pain, unspecified: Secondary | ICD-10-CM | POA: Diagnosis not present

## 2023-09-28 DIAGNOSIS — I10 Essential (primary) hypertension: Secondary | ICD-10-CM | POA: Diagnosis not present

## 2023-09-28 DIAGNOSIS — K900 Celiac disease: Secondary | ICD-10-CM | POA: Diagnosis not present

## 2023-09-28 DIAGNOSIS — E860 Dehydration: Secondary | ICD-10-CM | POA: Diagnosis not present

## 2023-09-28 DIAGNOSIS — R112 Nausea with vomiting, unspecified: Secondary | ICD-10-CM | POA: Diagnosis not present

## 2023-10-04 DIAGNOSIS — I10 Essential (primary) hypertension: Secondary | ICD-10-CM | POA: Diagnosis not present

## 2023-10-04 DIAGNOSIS — E538 Deficiency of other specified B group vitamins: Secondary | ICD-10-CM | POA: Diagnosis not present

## 2023-10-04 DIAGNOSIS — K900 Celiac disease: Secondary | ICD-10-CM | POA: Diagnosis not present

## 2023-10-04 DIAGNOSIS — R197 Diarrhea, unspecified: Secondary | ICD-10-CM | POA: Diagnosis not present

## 2023-10-04 DIAGNOSIS — R112 Nausea with vomiting, unspecified: Secondary | ICD-10-CM | POA: Diagnosis not present

## 2023-10-04 DIAGNOSIS — Z6824 Body mass index (BMI) 24.0-24.9, adult: Secondary | ICD-10-CM | POA: Diagnosis not present

## 2023-10-04 DIAGNOSIS — R7303 Prediabetes: Secondary | ICD-10-CM | POA: Diagnosis not present

## 2023-10-04 DIAGNOSIS — M1711 Unilateral primary osteoarthritis, right knee: Secondary | ICD-10-CM | POA: Diagnosis not present

## 2023-10-13 DIAGNOSIS — M25561 Pain in right knee: Secondary | ICD-10-CM | POA: Diagnosis not present

## 2023-10-19 ENCOUNTER — Ambulatory Visit: Payer: Self-pay | Admitting: Emergency Medicine

## 2023-10-19 DIAGNOSIS — G8929 Other chronic pain: Secondary | ICD-10-CM

## 2023-10-19 NOTE — H&P (View-Only) (Signed)
 TOTAL KNEE ADMISSION H&P  Patient is being admitted for right total knee arthroplasty.  Subjective:  Chief Complaint:right knee pain.  HPI: [name], 83 y.o. female, has a history of pain and functional disability in the right knee due to arthritis and has failed non-surgical conservative treatments for greater than 12 weeks to includeNSAID's and/or analgesics, corticosteriod injections, viscosupplementation injections, and activity modification.  Onset of symptoms was gradual, starting >10 years ago with gradually worsening course since that time. The patient noted prior procedures on the knee to include  arthroscopy on the right knee(s).  Patient currently rates pain in the right knee(s) at 10 out of 10 with activity. Patient has night pain, worsening of pain with activity and weight bearing, pain that interferes with activities of daily living, and pain with passive range of motion.  Patient has evidence of periarticular osteophytes and joint space narrowing by imaging studies.  There is no active infection.  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   Mass of urinary bladder 12/23/2013   Uterine polyp 08/06/2013   Constipation 08/05/2013   Thickened endometrium incidentally noted on CT 08/05/2013   Diverticulitis of large intestine with abscess without bleeding 08/04/2013   HTN (hypertension) 08/04/2013   Ovarian cystic mass 02/01/2011   Tubo-ovarian abscess 02/01/2011   Hyponatremia 02/01/2011   Hypokalemia 02/01/2011   Barrett's esophagus 12/17/2008   DIVERTICULITIS OF COLON 12/17/2008   NONSPECIFIC ABN FINDING RAD & OTH EXAM GI TRACT 12/17/2008   Past Medical History:  Diagnosis Date   Anemia    as child   Barrett's esophagus    Blood in urine    has symptoms  of UTI- burning and aching lower abdomen   Celiac disease    per Dr. Aneita   Colovesical fistula    Complication of anesthesia 01/11/1964   adverse reaction to saddle block   Degenerative joint disease    knees   Diverticulitis    and diverticulosis in sigmoid colon    Erosive esophagitis    GERD (gastroesophageal reflux disease)    Heart murmur    DOES NOT CAUSE ANY PROBLEMS   Hiatal hernia    Hypertension    PONV (postoperative nausea and vomiting)    Rheumatic fever    as child   Shingles     Past Surgical History:  Procedure Laterality Date   APPENDECTOMY     BUNIONECTOMY Right    CARPAL TUNNEL RELEASE Left    CYSTOSCOPY W/ RETROGRADES Left 12/23/2013   Procedure: CYSTOSCOPY WITH LEFT RETROGRADE PYELOGRAM;  Surgeon: Arlena LILLETTE Gal, MD;  Location: [facility];  Service: Urology;  Laterality: Left;   HYSTEROSCOPY WITH D & C N/A 11/06/2013   Procedure: DILATATION AND CURETTAGE /HYSTEROSCOPY;  Surgeon: Rosaline DELENA Luna, MD;  Location: [facility];  Service: Gynecology;  Laterality: N/A;   KNEE ARTHROSCOPY Right    LAPAROSCOPIC PARTIAL COLECTOMY N/A 02/04/2014   Procedure: LAPAROSCOPIC PARTIAL COLECTOMY;  Surgeon: Donnice KATHEE Lunger, MD;  Location: [facility];  Service: General;  Laterality: N/A;   TAKE DOWN OF INTESTINAL FISTULA N/A 02/04/2014   Procedure: TAKE DOWN OF COLOVESICAL FISTULA;  Surgeon: Donnice KATHEE Lunger, MD;  Location: [facility];  Service: General;  Laterality: N/A;   TONSILLECTOMY     x2   TRANSURETHRAL  RESECTION OF BLADDER TUMOR N/A 12/23/2013   Procedure: TRANSURETHRAL RESECTION OF BLADDER TUMOR (TURBT);  Surgeon: Arlena LILLETTE Gal, MD;  Location: [facility];  Service: Urology;  Laterality: N/A;   VESICO-VAGINAL FISTULA REPAIR N/A 02/04/2014   Procedure: COLOVESICAL FISTULA REPAIR ;  Surgeon: Arlena LILLETTE Gal, MD;  Location: [facility];  Service: Urology;  Laterality: N/A;    Current Outpatient Medications  Medication Sig Dispense Refill Last Dose/Taking   amLODipine   (NORVASC ) 5 MG tablet Take 5 mg by mouth daily.       Cholecalciferol 50 MCG (2000 UT) CAPS 1 capsule Orally Once a day for 90 days      Cyanocobalamin  1000 MCG SUBL 1 tab Sublingual Once a day      cycloSPORINE  (RESTASIS ) 0.05 % ophthalmic emulsion Place 1 drop into both eyes 2 (two) times daily.      famotidine (PEPCID) 10 MG tablet 1 tablet at bedtime as needed Orally Once a day      gabapentin  (NEURONTIN ) 100 MG capsule Take 2 capsules (200 mg total) by mouth 2 (two) times daily. 180 capsule 3    losartan  (COZAAR ) 100 MG tablet Take 100 mg by mouth daily.       metoprolol  succinate (TOPROL -XL) 50 MG 24 hr tablet Take 50 mg by mouth every evening.       omeprazole (PRILOSEC) 40 MG capsule Take 40 mg by mouth daily.      ondansetron  (ZOFRAN ) 4 MG tablet Take 1 tablet (4 mg total) by mouth every 6 (six) hours. 12 tablet 0    ondansetron  (ZOFRAN -ODT) 8 MG disintegrating tablet Take 1 tablet (8 mg total) by mouth every 8 (eight) hours as needed for nausea or vomiting. 20 tablet 0    No current facility-administered medications for this visit.   Allergies  Allergen Reactions   Gluten Meal     Diarrhea, vomiting that lasts 3-4 hours   Ace Inhibitors     Coughing   Ciprofloxacin     Joint pain   Covid-19 (Mrna) Vaccine     Other Reaction(s): Bell's Palsy   Ibuprofen     Joint pain   Levofloxacin     Pain in tendons   Simvastatin Other (See Comments)    Muscle pain    Social History   Tobacco Use   Smoking status: Former    Current packs/day: 0.00    Types: Cigarettes    Quit date: 03/18/1967    Years since quitting: 56.6   Smokeless tobacco: Never  Substance Use Topics   Alcohol use: No    Family History  Problem Relation Age of Onset   Stroke Mother        multiple   Heart disease Father    Breast cancer Sister    Diverticulitis Sister        colon surgery   Diverticulitis Brother         colon surgery   Colon cancer Neg Hx    Esophageal cancer Neg Hx    Rectal  cancer Neg Hx    Stomach cancer Neg Hx      Review of Systems  Musculoskeletal:  Positive for arthralgias.  All other systems reviewed and are negative.   Objective:  Physical Exam Constitutional:      General: She is not in acute distress.    Appearance: Normal appearance. She is not ill-appearing.  HENT:     Head: Normocephalic and atraumatic.     Right Ear: External ear normal.  Left Ear: External ear normal.     Nose: Nose normal.     Mouth/Throat:     Mouth: Mucous membranes are moist.     Pharynx: Oropharynx is clear.  Eyes:     Extraocular Movements: Extraocular movements intact.     Conjunctiva/sclera: Conjunctivae normal.  Cardiovascular:     Rate and Rhythm: Normal rate and regular rhythm.     Pulses: Normal pulses.     Heart sounds: Normal heart sounds.  Pulmonary:     Effort: Pulmonary effort is normal.     Breath sounds: Normal breath sounds.  Abdominal:     General: Bowel sounds are normal.     Palpations: Abdomen is soft.     Tenderness: There is no abdominal tenderness.  Musculoskeletal:        General: Tenderness present.     Cervical back: Normal range of motion and neck supple.     Comments: On examination, the patient is sitting comfortably in no acute distress. Range of motion of the right knee is 7 to 85 degrees.  Left knee is 0 to 115 active, 120 passive. Both knees stable to varus and valgus stress. Right knee with tenderness along bilateral joint lines and mild effusion appreciated. Notable valgus deformity. Antalgic gait without use of assistive device. Bilateral lower extremities appear grossly neurovascularly intact.   Skin:    General: Skin is warm and dry.  Neurological:     Mental Status: She is alert and oriented to person, place, and time. Mental status is at baseline.  Psychiatric:        Mood and Affect: Mood normal.        Behavior: Behavior normal.     Vital signs in last 24 hours: @VSRANGES @  Labs:   Estimated body  mass index is 24.41 kg/m as calculated from the following:   Height as of 02/21/23: 5' (1.524 m).   Weight as of 02/21/23: 56.7 kg.   Imaging Review Plain radiographs demonstrate severe degenerative joint disease of the right knee(s). The overall alignment issignificant valgus. The bone quality appears to be fair for age and reported activity level.      Assessment/Plan:  End stage arthritis, right knee   The patient history, physical examination, clinical judgment of the provider and imaging studies are consistent with end stage degenerative joint disease of the right knee(s) and total knee arthroplasty is deemed medically necessary. The treatment options including medical management, injection therapy arthroscopy and arthroplasty were discussed at length. The risks and benefits of total knee arthroplasty were presented and reviewed. The risks due to aseptic loosening, infection, stiffness, patella tracking problems, thromboembolic complications and other imponderables were discussed. The patient acknowledged the explanation, agreed to proceed with the plan and consent was signed. Patient is being admitted for inpatient treatment for surgery, pain control, PT, OT, prophylactic antibiotics, VTE prophylaxis, progressive ambulation and ADL's and discharge planning. The patient is planning to be discharged OPPT    Anticipated LOS equal to or greater than 2 midnights due to - Age 55 and older with one or more of the following:  - Obesity  - Expected need for hospital services (PT, OT, Nursing) required for safe  discharge  - Anticipated need for postoperative skilled nursing care or inpatient rehab  - Active co-morbidities: prediabetes, HTN, HLD, osteoporosis, celiac disease, RLS, barrett's esophagus, diverticulitis, chronic divercitular fistula, GERD, low B12 OR   - Unanticipated findings during/Post Surgery: None  - Patient is a high risk of  re-admission due to: None

## 2023-10-19 NOTE — H&P (Signed)
 TOTAL KNEE ADMISSION H&P  Patient is being admitted for right total knee arthroplasty.  Subjective:  Chief Complaint:right knee pain.  HPI: [name], 83 y.o. female, has a history of pain and functional disability in the right knee due to arthritis and has failed non-surgical conservative treatments for greater than 12 weeks to includeNSAID's and/or analgesics, corticosteriod injections, viscosupplementation injections, and activity modification.  Onset of symptoms was gradual, starting >10 years ago with gradually worsening course since that time. The patient noted prior procedures on the knee to include  arthroscopy on the right knee(s).  Patient currently rates pain in the right knee(s) at 10 out of 10 with activity. Patient has night pain, worsening of pain with activity and weight bearing, pain that interferes with activities of daily living, and pain with passive range of motion.  Patient has evidence of periarticular osteophytes and joint space narrowing by imaging studies.  There is no active infection.  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   Mass of urinary bladder 12/23/2013   Uterine polyp 08/06/2013   Constipation 08/05/2013   Thickened endometrium incidentally noted on CT 08/05/2013   Diverticulitis of large intestine with abscess without bleeding 08/04/2013   HTN (hypertension) 08/04/2013   Ovarian cystic mass 02/01/2011   Tubo-ovarian abscess 02/01/2011   Hyponatremia 02/01/2011   Hypokalemia 02/01/2011   Barrett's esophagus 12/17/2008   DIVERTICULITIS OF COLON 12/17/2008   NONSPECIFIC ABN FINDING RAD & OTH EXAM GI TRACT 12/17/2008   Past Medical History:  Diagnosis Date   Anemia    as child   Barrett's esophagus    Blood in urine    has symptoms  of UTI- burning and aching lower abdomen   Celiac disease    per Dr. Aneita   Colovesical fistula    Complication of anesthesia 01/11/1964   adverse reaction to saddle block   Degenerative joint disease    knees   Diverticulitis    and diverticulosis in sigmoid colon    Erosive esophagitis    GERD (gastroesophageal reflux disease)    Heart murmur    DOES NOT CAUSE ANY PROBLEMS   Hiatal hernia    Hypertension    PONV (postoperative nausea and vomiting)    Rheumatic fever    as child   Shingles     Past Surgical History:  Procedure Laterality Date   APPENDECTOMY     BUNIONECTOMY Right    CARPAL TUNNEL RELEASE Left    CYSTOSCOPY W/ RETROGRADES Left 12/23/2013   Procedure: CYSTOSCOPY WITH LEFT RETROGRADE PYELOGRAM;  Surgeon: Arlena LILLETTE Gal, MD;  Location: [facility];  Service: Urology;  Laterality: Left;   HYSTEROSCOPY WITH D & C N/A 11/06/2013   Procedure: DILATATION AND CURETTAGE /HYSTEROSCOPY;  Surgeon: Rosaline DELENA Luna, MD;  Location: [facility];  Service: Gynecology;  Laterality: N/A;   KNEE ARTHROSCOPY Right    LAPAROSCOPIC PARTIAL COLECTOMY N/A 02/04/2014   Procedure: LAPAROSCOPIC PARTIAL COLECTOMY;  Surgeon: Donnice KATHEE Lunger, MD;  Location: [facility];  Service: General;  Laterality: N/A;   TAKE DOWN OF INTESTINAL FISTULA N/A 02/04/2014   Procedure: TAKE DOWN OF COLOVESICAL FISTULA;  Surgeon: Donnice KATHEE Lunger, MD;  Location: [facility];  Service: General;  Laterality: N/A;   TONSILLECTOMY     x2   TRANSURETHRAL  RESECTION OF BLADDER TUMOR N/A 12/23/2013   Procedure: TRANSURETHRAL RESECTION OF BLADDER TUMOR (TURBT);  Surgeon: Arlena LILLETTE Gal, MD;  Location: [facility];  Service: Urology;  Laterality: N/A;   VESICO-VAGINAL FISTULA REPAIR N/A 02/04/2014   Procedure: COLOVESICAL FISTULA REPAIR ;  Surgeon: Arlena LILLETTE Gal, MD;  Location: [facility];  Service: Urology;  Laterality: N/A;    Current Outpatient Medications  Medication Sig Dispense Refill Last Dose/Taking   amLODipine   (NORVASC ) 5 MG tablet Take 5 mg by mouth daily.       Cholecalciferol 50 MCG (2000 UT) CAPS 1 capsule Orally Once a day for 90 days      Cyanocobalamin  1000 MCG SUBL 1 tab Sublingual Once a day      cycloSPORINE  (RESTASIS ) 0.05 % ophthalmic emulsion Place 1 drop into both eyes 2 (two) times daily.      famotidine (PEPCID) 10 MG tablet 1 tablet at bedtime as needed Orally Once a day      gabapentin  (NEURONTIN ) 100 MG capsule Take 2 capsules (200 mg total) by mouth 2 (two) times daily. 180 capsule 3    losartan  (COZAAR ) 100 MG tablet Take 100 mg by mouth daily.       metoprolol  succinate (TOPROL -XL) 50 MG 24 hr tablet Take 50 mg by mouth every evening.       omeprazole (PRILOSEC) 40 MG capsule Take 40 mg by mouth daily.      ondansetron  (ZOFRAN ) 4 MG tablet Take 1 tablet (4 mg total) by mouth every 6 (six) hours. 12 tablet 0    ondansetron  (ZOFRAN -ODT) 8 MG disintegrating tablet Take 1 tablet (8 mg total) by mouth every 8 (eight) hours as needed for nausea or vomiting. 20 tablet 0    No current facility-administered medications for this visit.   Allergies  Allergen Reactions   Gluten Meal     Diarrhea, vomiting that lasts 3-4 hours   Ace Inhibitors     Coughing   Ciprofloxacin     Joint pain   Covid-19 (Mrna) Vaccine     Other Reaction(s): Bell's Palsy   Ibuprofen     Joint pain   Levofloxacin     Pain in tendons   Simvastatin Other (See Comments)    Muscle pain    Social History   Tobacco Use   Smoking status: Former    Current packs/day: 0.00    Types: Cigarettes    Quit date: 03/18/1967    Years since quitting: 56.6   Smokeless tobacco: Never  Substance Use Topics   Alcohol use: No    Family History  Problem Relation Age of Onset   Stroke Mother        multiple   Heart disease Father    Breast cancer Sister    Diverticulitis Sister        colon surgery   Diverticulitis Brother         colon surgery   Colon cancer Neg Hx    Esophageal cancer Neg Hx    Rectal  cancer Neg Hx    Stomach cancer Neg Hx      Review of Systems  Musculoskeletal:  Positive for arthralgias.  All other systems reviewed and are negative.   Objective:  Physical Exam Constitutional:      General: She is not in acute distress.    Appearance: Normal appearance. She is not ill-appearing.  HENT:     Head: Normocephalic and atraumatic.     Right Ear: External ear normal.  Left Ear: External ear normal.     Nose: Nose normal.     Mouth/Throat:     Mouth: Mucous membranes are moist.     Pharynx: Oropharynx is clear.  Eyes:     Extraocular Movements: Extraocular movements intact.     Conjunctiva/sclera: Conjunctivae normal.  Cardiovascular:     Rate and Rhythm: Normal rate and regular rhythm.     Pulses: Normal pulses.     Heart sounds: Normal heart sounds.  Pulmonary:     Effort: Pulmonary effort is normal.     Breath sounds: Normal breath sounds.  Abdominal:     General: Bowel sounds are normal.     Palpations: Abdomen is soft.     Tenderness: There is no abdominal tenderness.  Musculoskeletal:        General: Tenderness present.     Cervical back: Normal range of motion and neck supple.     Comments: On examination, the patient is sitting comfortably in no acute distress. Range of motion of the right knee is 7 to 85 degrees.  Left knee is 0 to 115 active, 120 passive. Both knees stable to varus and valgus stress. Right knee with tenderness along bilateral joint lines and mild effusion appreciated. Notable valgus deformity. Antalgic gait without use of assistive device. Bilateral lower extremities appear grossly neurovascularly intact.   Skin:    General: Skin is warm and dry.  Neurological:     Mental Status: She is alert and oriented to person, place, and time. Mental status is at baseline.  Psychiatric:        Mood and Affect: Mood normal.        Behavior: Behavior normal.     Vital signs in last 24 hours: @VSRANGES @  Labs:   Estimated body  mass index is 24.41 kg/m as calculated from the following:   Height as of 02/21/23: 5' (1.524 m).   Weight as of 02/21/23: 56.7 kg.   Imaging Review Plain radiographs demonstrate severe degenerative joint disease of the right knee(s). The overall alignment issignificant valgus. The bone quality appears to be fair for age and reported activity level.      Assessment/Plan:  End stage arthritis, right knee   The patient history, physical examination, clinical judgment of the provider and imaging studies are consistent with end stage degenerative joint disease of the right knee(s) and total knee arthroplasty is deemed medically necessary. The treatment options including medical management, injection therapy arthroscopy and arthroplasty were discussed at length. The risks and benefits of total knee arthroplasty were presented and reviewed. The risks due to aseptic loosening, infection, stiffness, patella tracking problems, thromboembolic complications and other imponderables were discussed. The patient acknowledged the explanation, agreed to proceed with the plan and consent was signed. Patient is being admitted for inpatient treatment for surgery, pain control, PT, OT, prophylactic antibiotics, VTE prophylaxis, progressive ambulation and ADL's and discharge planning. The patient is planning to be discharged OPPT    Anticipated LOS equal to or greater than 2 midnights due to - Age 55 and older with one or more of the following:  - Obesity  - Expected need for hospital services (PT, OT, Nursing) required for safe  discharge  - Anticipated need for postoperative skilled nursing care or inpatient rehab  - Active co-morbidities: prediabetes, HTN, HLD, osteoporosis, celiac disease, RLS, barrett's esophagus, diverticulitis, chronic divercitular fistula, GERD, low B12 OR   - Unanticipated findings during/Post Surgery: None  - Patient is a high risk of  re-admission due to: None

## 2023-10-25 NOTE — Patient Instructions (Signed)
 SURGICAL WAITING ROOM VISITATION  Patients having surgery or a procedure may have no more than 2 support people in the waiting area - these visitors may rotate.    Children under the age of 41 must have an adult with them who is not the patient.  Visitors with respiratory illnesses are discouraged from visiting and should remain at home.  If the patient needs to stay at the hospital during part of their recovery, the visitor guidelines for inpatient rooms apply. Pre-op nurse will coordinate an appropriate time for 1 support person to accompany patient in pre-op.  This support person may not rotate.    Please refer to the [facility] website for the visitor guidelines for Inpatients (after your surgery is over and you are in a regular room).       Your procedure is scheduled on:  11/06/2023    Report to [facility] Main Entrance    Report to admitting at  0730 AM   Call this number if you have problems the morning of surgery [phone]   Do not eat food :After Midnight.   After Midnight you may have the following liquids until _0700_____ AM  DAY OF SURGERY  Water Non-Citrus Juices (without pulp, NO RED-Apple, White grape, White cranberry) Black Coffee (NO MILK/CREAM OR CREAMERS, sugar ok)  Clear Tea (NO MILK/CREAM OR CREAMERS, sugar ok) regular and decaf                             Plain Jell-O (NO RED)                                           Fruit ices (not with fruit pulp, NO RED)                                     Popsicles (NO RED)                                                               Sports drinks like Gatorade (NO RED)              Drink 2 Ensure/G2 drinks AT 10:00 PM the night before surgery.        The day of surgery:  Drink ONE (1) Pre-Surgery Clear Ensure or G2 at  0700AM the morning of surgery. Drink in one sitting. Do not sip.  This drink was given to you during your hospital  pre-op appointment visit. Nothing else to drink after completing  the  Pre-Surgery Clear Ensure or G2.          If you have questions, please contact your surgeon's office.       Oral Hygiene is also important to reduce your risk of infection.                                    Remember - BRUSH YOUR TEETH THE MORNING OF SURGERY WITH YOUR REGULAR TOOTHPASTE  DENTURES WILL BE  REMOVED PRIOR TO SURGERY PLEASE DO NOT APPLY Poly grip OR ADHESIVES!!!   Do NOT smoke after Midnight   Stop all vitamins and herbal supplements 7 days before surgery.   Take these medicines the morning of surgery with A SIP OF WATER:  amlodipoine, eye drops as usual , gabapentin , omeprazole   DO NOT TAKE ANY ORAL DIABETIC MEDICATIONS DAY OF YOUR SURGERY  Bring CPAP mask and tubing day of surgery.                              You may not have any metal on your body including hair pins, jewelry, and body piercing             Do not wear make-up, lotions, powders, perfumes/cologne, or deodorant  Do not wear nail polish including gel and S&S, artificial/acrylic nails, or any other type of covering on natural nails including finger and toenails. If you have artificial nails, gel coating, etc. that needs to be removed by a nail salon please have this removed prior to surgery or surgery may need to be canceled/ delayed if the surgeon/ anesthesia feels like they are unable to be safely monitored.   Do not shave  48 hours prior to surgery.               Men may shave face and neck.   Do not bring valuables to the hospital. [facility] IS NOT             RESPONSIBLE   FOR VALUABLES.   Contacts, glasses, dentures or bridgework may not be worn into surgery.   Bring small overnight bag day of surgery.   DO NOT BRING YOUR HOME MEDICATIONS TO THE HOSPITAL. PHARMACY WILL DISPENSE MEDICATIONS LISTED ON YOUR MEDICATION LIST TO YOU DURING YOUR ADMISSION IN THE HOSPITAL!    Patients discharged on the day of surgery will not be allowed to drive home.  Someone NEEDS to stay with you for  the first 24 hours after anesthesia.   Special Instructions: Bring a copy of your healthcare power of attorney and living will documents the day of surgery if you haven't scanned them before.              Please read over the following fact sheets you were given: IF YOU HAVE QUESTIONS ABOUT YOUR PRE-OP INSTRUCTIONS PLEASE CALL [phone].   If you received a COVID test during your pre-op visit  it is requested that you wear a mask when out in public, stay away from anyone that may not be feeling well and notify your surgeon if you develop symptoms. If you test positive for Covid or have been in contact with anyone that has tested positive in the last 10 days please notify you surgeon.      Pre-operative 4 CHG Bath Instructions   You can play a key role in reducing the risk of infection after surgery. Your skin needs to be as free of germs as possible. You can reduce the number of germs on your skin by washing with CHG (chlorhexidine  gluconate) soap before surgery. CHG is an antiseptic soap that kills germs and continues to kill germs even after washing.   DO NOT use if you have an allergy to chlorhexidine /CHG or antibacterial soaps. If your skin becomes reddened or irritated, stop using the CHG and notify one of our RNs at [phone].   Please shower with the CHG soap starting  4 days before surgery using the following schedule:     Please keep in mind the following:  DO NOT shave, including legs and underarms, starting the day of your first shower.   You may shave your face at any point before/day of surgery.  Place clean sheets on your bed the day you start using CHG soap. Use a clean washcloth (not used since being washed) for each shower. DO NOT sleep with pets once you start using the CHG.   CHG Shower Instructions:  If you choose to wash your hair and private area, wash first with your normal shampoo/soap.  After you use shampoo/soap, rinse your hair and body thoroughly to remove  shampoo/soap residue.  Turn the water OFF and apply about 3 tablespoons (45 ml) of CHG soap to a CLEAN washcloth.  Apply CHG soap ONLY FROM YOUR NECK DOWN TO YOUR TOES (washing for 3-5 minutes)  DO NOT use CHG soap on face, private areas, open wounds, or sores.  Pay special attention to the area where your surgery is being performed.  If you are having back surgery, having someone wash your back for you may be helpful. Wait 2 minutes after CHG soap is applied, then you may rinse off the CHG soap.  Pat dry with a clean towel  Put on clean clothes/pajamas   If you choose to wear lotion, please use ONLY the CHG-compatible lotions on the back of this paper.     Additional instructions for the day of surgery: DO NOT APPLY any lotions, deodorants, cologne, or perfumes.   Put on clean/comfortable clothes.  Brush your teeth.  Ask your nurse before applying any prescription medications to the skin.      CHG Compatible Lotions   Aveeno Moisturizing lotion  Cetaphil Moisturizing Cream  Cetaphil Moisturizing Lotion  Clairol Herbal Essence Moisturizing Lotion, Dry Skin  Clairol Herbal Essence Moisturizing Lotion, Extra Dry Skin  Clairol Herbal Essence Moisturizing Lotion, Normal Skin  Curel Age Defying Therapeutic Moisturizing Lotion with Alpha Hydroxy  Curel Extreme Care Body Lotion  Curel Soothing Hands Moisturizing Hand Lotion  Curel Therapeutic Moisturizing Cream, Fragrance-Free  Curel Therapeutic Moisturizing Lotion, Fragrance-Free  Curel Therapeutic Moisturizing Lotion, Original Formula  Eucerin Daily Replenishing Lotion  Eucerin Dry Skin Therapy Plus Alpha Hydroxy Crme  Eucerin Dry Skin Therapy Plus Alpha Hydroxy Lotion  Eucerin Original Crme  Eucerin Original Lotion  Eucerin Plus Crme Eucerin Plus Lotion  Eucerin TriLipid Replenishing Lotion  Keri Anti-Bacterial Hand Lotion  Keri Deep Conditioning Original Lotion Dry Skin Formula Softly Scented  Keri Deep Conditioning  Original Lotion, Fragrance Free Sensitive Skin Formula  Keri Lotion Fast Absorbing Fragrance Free Sensitive Skin Formula  Keri Lotion Fast Absorbing Softly Scented Dry Skin Formula  Keri Original Lotion  Keri Skin Renewal Lotion Keri Silky Smooth Lotion  Keri Silky Smooth Sensitive Skin Lotion  Nivea Body Creamy Conditioning Oil  Nivea Body Extra Enriched Teacher, adult education Moisturizing Lotion Nivea Crme  Nivea Skin Firming Lotion  NutraDerm 30 Skin Lotion  NutraDerm Skin Lotion  NutraDerm Therapeutic Skin Cream  NutraDerm Therapeutic Skin Lotion  ProShield Protective Hand Cream  Provon moisturizing lotion

## 2023-10-26 NOTE — Care Plan (Signed)
 Ortho Bundle Case Management Note  Patient Details  Name: [name] MRN: [id number] Date of Birth: 08-Jul-1940  patient met PA in the office for H&P. will discharge to home with family to assist. has DME. OPPT set up with [facility]. discharge instructions discussed and questions answered. CM spoke with daughter on phone. no further questions. Patient and MD in agreement with plan. Choice offered.                   DME Arranged:    DME Agency:     HH Arranged:  PT HH Agency:  [facility]  Additional Comments: Please contact me with any questions of if this plan should need to change.  Charlies Pitch,  RN,BSN,MHA,CCM  [facility] Orthopaedic Specialist  [phone] 10/26/2023, 11:25 AM

## 2023-10-30 ENCOUNTER — Other Ambulatory Visit: Payer: Self-pay

## 2023-10-30 ENCOUNTER — Encounter (HOSPITAL_COMMUNITY): Payer: Self-pay

## 2023-10-30 ENCOUNTER — Encounter (HOSPITAL_COMMUNITY)
Admission: RE | Admit: 2023-10-30 | Discharge: 2023-10-30 | Disposition: A | Discharge status: Home / Self Care | Source: Ambulatory Visit | Attending: Orthopedic Surgery | Admitting: Orthopedic Surgery

## 2023-10-30 VITALS — BP 148/80 | HR 66 | Temp 97.8°F | Resp 16 | Ht 60.75 in | Wt 126.0 lb

## 2023-10-30 DIAGNOSIS — Z01812 Encounter for preprocedural laboratory examination: Secondary | ICD-10-CM | POA: Diagnosis present

## 2023-10-30 DIAGNOSIS — I10 Essential (primary) hypertension: Secondary | ICD-10-CM | POA: Diagnosis not present

## 2023-10-30 DIAGNOSIS — R7303 Prediabetes: Secondary | ICD-10-CM | POA: Diagnosis not present

## 2023-10-30 DIAGNOSIS — Z87891 Personal history of nicotine dependence: Secondary | ICD-10-CM | POA: Diagnosis not present

## 2023-10-30 DIAGNOSIS — Z01818 Encounter for other preprocedural examination: Secondary | ICD-10-CM | POA: Diagnosis not present

## 2023-10-30 DIAGNOSIS — G8929 Other chronic pain: Secondary | ICD-10-CM | POA: Diagnosis not present

## 2023-10-30 DIAGNOSIS — M25561 Pain in right knee: Secondary | ICD-10-CM | POA: Insufficient documentation

## 2023-10-30 DIAGNOSIS — M1711 Unilateral primary osteoarthritis, right knee: Secondary | ICD-10-CM | POA: Insufficient documentation

## 2023-10-30 DIAGNOSIS — Z9889 Other specified postprocedural states: Secondary | ICD-10-CM | POA: Diagnosis not present

## 2023-10-30 DIAGNOSIS — G2581 Restless legs syndrome: Secondary | ICD-10-CM | POA: Diagnosis not present

## 2023-10-30 DIAGNOSIS — K449 Diaphragmatic hernia without obstruction or gangrene: Secondary | ICD-10-CM | POA: Insufficient documentation

## 2023-10-30 DIAGNOSIS — Z01810 Encounter for preprocedural cardiovascular examination: Secondary | ICD-10-CM | POA: Diagnosis present

## 2023-10-30 HISTORY — DX: Prediabetes: R73.03

## 2023-10-30 HISTORY — DX: Pneumonia, unspecified organism: J18.9

## 2023-10-30 LAB — CBC WITH DIFFERENTIAL/PLATELET
Abs Immature Granulocytes: 0.02 K/uL (ref 0.00–0.07)
Basophils Absolute: 0 K/uL (ref 0.0–0.1)
Basophils Relative: 0 %
Eosinophils Absolute: 0.1 K/uL (ref 0.0–0.5)
Eosinophils Relative: 1 %
HCT: 41.6 % (ref 36.0–46.0)
Hemoglobin: 12.8 g/dL (ref 12.0–15.0)
Immature Granulocytes: 0 %
Lymphocytes Relative: 14 %
Lymphs Abs: 1.4 K/uL (ref 0.7–4.0)
MCH: 28.7 pg (ref 26.0–34.0)
MCHC: 30.8 g/dL (ref 30.0–36.0)
MCV: 93.3 fL (ref 80.0–100.0)
Monocytes Absolute: 0.7 K/uL (ref 0.1–1.0)
Monocytes Relative: 7 %
Neutro Abs: 7.5 K/uL (ref 1.7–7.7)
Neutrophils Relative %: 78 %
Platelets: 295 K/uL (ref 150–400)
RBC: 4.46 MIL/uL (ref 3.87–5.11)
RDW: 14.3 % (ref 11.5–15.5)
WBC: 9.7 K/uL (ref 4.0–10.5)
nRBC: 0 % (ref 0.0–0.2)

## 2023-10-30 LAB — COMPREHENSIVE METABOLIC PANEL WITH GFR
ALT: 7 U/L (ref 0–44)
AST: 21 U/L (ref 15–41)
Albumin: 4.4 g/dL (ref 3.5–5.0)
Alkaline Phosphatase: 114 U/L (ref 38–126)
Anion gap: 11 (ref 5–15)
BUN: 26 mg/dL — ABNORMAL HIGH (ref 8–23)
CO2: 25 mmol/L (ref 22–32)
Calcium: 9.6 mg/dL (ref 8.9–10.3)
Chloride: 105 mmol/L (ref 98–111)
Creatinine, Ser: 0.8 mg/dL (ref 0.44–1.00)
GFR, Estimated: >60 mL/min (ref >60)
Glucose, Bld: 133 mg/dL — ABNORMAL HIGH (ref 70–99)
Potassium: 4.2 mmol/L (ref 3.5–5.1)
Sodium: 140 mmol/L (ref 135–145)
Total Bilirubin: 0.4 mg/dL (ref 0.0–1.2)
Total Protein: 7.2 g/dL (ref 6.5–8.1)

## 2023-10-30 LAB — SURGICAL PCR SCREEN
MRSA, PCR: NEGATIVE
Staphylococcus aureus: NEGATIVE

## 2023-10-30 LAB — HEMOGLOBIN A1C
Hgb A1c MFr Bld: 5.9 % — ABNORMAL HIGH (ref 4.8–5.6)
Mean Plasma Glucose: 122.63 mg/dL

## 2023-10-30 NOTE — Progress Notes (Addendum)
 Anesthesia Review:  PCP: Sari Pay  Cardiologist : none   PPM/ ICD: Device Orders: Rep Notified:  Chest x-ray : EKG : 10/30/23  Echo : Stress test: Cardiac Cath :   Activity level: can do a flight of stairs without difficutly  Sleep Study/ CPAP : none  Fasting Blood Sugar :      / Checks Blood Sugar -- times a day:     Hgba1c-5.9  Blood Thinner/ Instructions /Last Dose: ASA / Instructions/ Last Dose :    Celiac disease

## 2023-11-01 NOTE — Progress Notes (Signed)
 Case: [id number] Date/Time: 11/06/23 0957   Procedure: ARTHROPLASTY, KNEE, TOTAL (Right: Knee)   Anesthesia type: Spinal   Pre-op diagnosis: OA RIGHT KNEE   Location: [facility] ROOM 06 / [facility]   Surgeons: Edna Toribio LABOR, MD       DISCUSSION: [name] is an 83 yo female with PMH of former smoking, HTN, RLS, GERD, Barrett esophagus, hiatal hernia, diverticulitis s/p colectomy (2016), prediabetes  Prior anesthesia complications include PONV  Patient had ED visit on 09/28/23 for episode of abdominal pain, N/V. Diagnosed with colitis and treated symptomatically.   Patient followed up with PCP on 10/04/23 for pre op clearance. Stable at that visit and cleared as low risk:[name] is low risk for complications from right total knee replacement - RCRI score is less than 1, making her low risk for cardiac complications as well.  No contraindications to planned surgery, reasonable to proceed.   VS: BP (!) 148/80   Pulse 66   Temp 36.6 C (Oral)   Resp 16   Ht 5' 0.75 (1.543 m)   Wt 57.2 kg   SpO2 99%   BMI 24.00 kg/m   PROVIDERS: Aisha Harvey, MD   LABS: Labs reviewed: Acceptable for surgery. (all labs ordered are listed, but only abnormal results are displayed)  Labs Reviewed  COMPREHENSIVE METABOLIC PANEL WITH GFR - Abnormal; Notable for the following components:      Result Value   Glucose, Bld 133 (*)    BUN 26 (*)    All other components within normal limits  HEMOGLOBIN A1C - Abnormal; Notable for the following components:   Hgb A1c MFr Bld 5.9 (*)    All other components within normal limits  SURGICAL PCR SCREEN  CBC WITH DIFFERENTIAL/PLATELET  TYPE AND SCREEN    CT A/P [facility]):  IMPRESSION: 1.  Mild bowel wall thickening of the mid and distal colon with postsurgical changes. Findings could relate to acute colitis in the appropriate setting. Recommend clinical correlation. 2.  Exophytic rounded hypoattenuation in the right kidney measuring 1.2 cm,  which is favored benign but incompletely evaluated on this exam. Consider outpatient renal ultrasound for further evaluation.  EKG 10/30/23:  Normal sinus rhythm Normal ECG  CV:  Past Medical History:  Diagnosis Date   Anemia    as child   Barrett's esophagus    Blood in urine    has symptoms of UTI- burning and aching lower abdomen   Celiac disease    per Dr. Aneita   Colovesical fistula    Complication of anesthesia 01/11/1964   adverse reaction to saddle block   Degenerative joint disease    knees   Diverticulitis    and diverticulosis in sigmoid colon    Erosive esophagitis    GERD (gastroesophageal reflux disease)    Heart murmur    DOES NOT CAUSE ANY PROBLEMS   Hiatal hernia    Hypertension    Pneumonia    PONV (postoperative nausea and vomiting)    Pre-diabetes    Rheumatic fever    as child   Shingles     Past Surgical History:  Procedure Laterality Date   APPENDECTOMY     BUNIONECTOMY Right    CARPAL TUNNEL RELEASE Left    CYSTOSCOPY W/ RETROGRADES Left 12/23/2013   Procedure: CYSTOSCOPY WITH LEFT RETROGRADE PYELOGRAM;  Surgeon: Arlena LILLETTE Gal, MD;  Location: [facility];  Service: Urology;  Laterality: Left;   HYSTEROSCOPY WITH D & C N/A 11/06/2013   Procedure:  DILATATION AND CURETTAGE /HYSTEROSCOPY;  Surgeon: Rosaline DELENA Luna, MD;  Location: [facility];  Service: Gynecology;  Laterality: N/A;   KNEE ARTHROSCOPY Right    LAPAROSCOPIC PARTIAL COLECTOMY N/A 02/04/2014   Procedure: LAPAROSCOPIC PARTIAL COLECTOMY;  Surgeon: Donnice KATHEE Lunger, MD;  Location: [facility];  Service: General;  Laterality: N/A;   TAKE DOWN OF INTESTINAL FISTULA N/A 02/04/2014   Procedure: TAKE DOWN OF COLOVESICAL FISTULA;  Surgeon: Donnice KATHEE Lunger, MD;  Location: [facility];  Service: General;  Laterality: N/A;   TONSILLECTOMY     x2   TRANSURETHRAL RESECTION OF BLADDER TUMOR N/A 12/23/2013   Procedure: TRANSURETHRAL RESECTION OF BLADDER TUMOR (TURBT);  Surgeon: Arlena LILLETTE Gal, MD;   Location: [facility];  Service: Urology;  Laterality: N/A;   VESICO-VAGINAL FISTULA REPAIR N/A 02/04/2014   Procedure: COLOVESICAL FISTULA REPAIR ;  Surgeon: Arlena LILLETTE Gal, MD;  Location: [facility];  Service: Urology;  Laterality: N/A;    MEDICATIONS:  amLODipine  (NORVASC ) 5 MG tablet   Brimonidine Tartrate (LUMIFY) 0.025 % SOLN   Cholecalciferol 50 MCG (2000 UT) CAPS   cyanocobalamin  (VITAMIN B12) 1000 MCG tablet   diphenhydramine-acetaminophen  (TYLENOL  PM EXTRA STRENGTH) 25-500 MG TABS tablet   gabapentin  (NEURONTIN ) 100 MG capsule   omeprazole (PRILOSEC) 40 MG capsule   ondansetron  (ZOFRAN ) 4 MG tablet   ondansetron  (ZOFRAN -ODT) 4 MG disintegrating tablet   ondansetron  (ZOFRAN -ODT) 8 MG disintegrating tablet   No current facility-administered medications for this encounter.    Burnard CHRISTELLA Odis DEVONNA [facility] Short Stay/Anesthesiology [facility] Phone [phone] 11/01/2023 9:10 AM

## 2023-11-06 ENCOUNTER — Observation Stay (HOSPITAL_COMMUNITY)

## 2023-11-06 ENCOUNTER — Observation Stay (HOSPITAL_COMMUNITY)
Admission: RE | Admit: 2023-11-06 | Discharge: 2023-11-08 | Disposition: A | Attending: Orthopedic Surgery | Admitting: Orthopedic Surgery

## 2023-11-06 ENCOUNTER — Encounter (HOSPITAL_COMMUNITY): Payer: Self-pay | Admitting: Orthopedic Surgery

## 2023-11-06 ENCOUNTER — Other Ambulatory Visit: Payer: Self-pay

## 2023-11-06 ENCOUNTER — Ambulatory Visit (HOSPITAL_COMMUNITY): Payer: Self-pay | Admitting: Physician Assistant

## 2023-11-06 ENCOUNTER — Ambulatory Visit (HOSPITAL_COMMUNITY): Payer: Self-pay | Admitting: Certified Registered"

## 2023-11-06 ENCOUNTER — Encounter (HOSPITAL_COMMUNITY)
Admission: RE | Disposition: A | Discharge status: Home / Self Care | Payer: Self-pay | Source: Home / Self Care | Attending: Orthopedic Surgery

## 2023-11-06 DIAGNOSIS — Z79899 Other long term (current) drug therapy: Secondary | ICD-10-CM | POA: Insufficient documentation

## 2023-11-06 DIAGNOSIS — Z87891 Personal history of nicotine dependence: Secondary | ICD-10-CM | POA: Insufficient documentation

## 2023-11-06 DIAGNOSIS — R609 Edema, unspecified: Secondary | ICD-10-CM | POA: Diagnosis not present

## 2023-11-06 DIAGNOSIS — Z01818 Encounter for other preprocedural examination: Secondary | ICD-10-CM

## 2023-11-06 DIAGNOSIS — I10 Essential (primary) hypertension: Secondary | ICD-10-CM | POA: Insufficient documentation

## 2023-11-06 DIAGNOSIS — Z7982 Long term (current) use of aspirin: Secondary | ICD-10-CM | POA: Insufficient documentation

## 2023-11-06 DIAGNOSIS — Z471 Aftercare following joint replacement surgery: Secondary | ICD-10-CM | POA: Diagnosis not present

## 2023-11-06 DIAGNOSIS — M1711 Unilateral primary osteoarthritis, right knee: Secondary | ICD-10-CM

## 2023-11-06 DIAGNOSIS — Z96651 Presence of right artificial knee joint: Secondary | ICD-10-CM | POA: Diagnosis not present

## 2023-11-06 DIAGNOSIS — M25561 Pain in right knee: Secondary | ICD-10-CM | POA: Diagnosis present

## 2023-11-06 HISTORY — PX: TOTAL KNEE ARTHROPLASTY: SHX125

## 2023-11-06 LAB — TYPE AND SCREEN
ABO/RH(D): O POS
Antibody Screen: NEGATIVE

## 2023-11-06 SURGERY — ARTHROPLASTY, KNEE, TOTAL
Anesthesia: Spinal | Site: Knee | Laterality: Right

## 2023-11-06 MED ORDER — OXYCODONE HCL 5 MG PO TABS
5.0000 mg | ORAL_TABLET | ORAL | Status: DC | PRN
  Administered 2023-11-06 – 2023-11-08 (×7): 5 mg via ORAL
  Filled 2023-11-06 (×7): qty 1
  Filled 2023-11-06: qty 2
  Filled 2023-11-06: qty 1

## 2023-11-06 MED ORDER — PROPOFOL 10 MG/ML IV BOLUS
INTRAVENOUS | Status: AC
  Filled 2023-11-06: qty 20

## 2023-11-06 MED ORDER — OXYCODONE HCL 5 MG/5ML PO SOLN: 5.0000 mg | Freq: Once | ORAL | Status: DC | PRN

## 2023-11-06 MED ORDER — AMLODIPINE BESYLATE 5 MG PO TABS
5.0000 mg | ORAL_TABLET | Freq: Every evening | ORAL | Status: DC
  Administered 2023-11-06 – 2023-11-07 (×2): 5 mg via ORAL
  Filled 2023-11-06 (×2): qty 1

## 2023-11-06 MED ORDER — PROPOFOL 1000 MG/100ML IV EMUL
INTRAVENOUS | Status: AC
Start: 2023-11-06 — End: 2023-11-06
  Filled 2023-11-06: qty 100

## 2023-11-06 MED ORDER — METHOCARBAMOL 500 MG PO TABS: 500.0000 mg | ORAL_TABLET | Freq: Three times a day (TID) | ORAL | 0 refills | Status: AC | PRN

## 2023-11-06 MED ORDER — FENTANYL CITRATE (PF) 50 MCG/ML IJ SOSY
25.0000 ug | PREFILLED_SYRINGE | INTRAMUSCULAR | Status: DC | PRN
  Administered 2023-11-06 (×2): 50 ug via INTRAVENOUS

## 2023-11-06 MED ORDER — ONDANSETRON HCL 4 MG PO TABS: 4.0000 mg | ORAL_TABLET | Freq: Three times a day (TID) | ORAL | 0 refills | Status: DC | PRN

## 2023-11-06 MED ORDER — CHLORHEXIDINE GLUCONATE 0.12 % MT SOLN
15.0000 mL | Freq: Once | OROMUCOSAL | Status: AC
  Administered 2023-11-06: 15 mL via OROMUCOSAL

## 2023-11-06 MED ORDER — DIPHENHYDRAMINE HCL 12.5 MG/5ML PO ELIX: 12.5000 mg | ORAL_SOLUTION | ORAL | Status: DC | PRN

## 2023-11-06 MED ORDER — PANTOPRAZOLE SODIUM 40 MG PO TBEC
40.0000 mg | DELAYED_RELEASE_TABLET | Freq: Every day | ORAL | Status: DC
  Administered 2023-11-06 – 2023-11-08 (×3): 40 mg via ORAL
  Filled 2023-11-06 (×3): qty 1

## 2023-11-06 MED ORDER — POLYETHYLENE GLYCOL 3350 17 G PO PACK: 17.0000 g | PACK | Freq: Every day | ORAL | Status: DC | PRN

## 2023-11-06 MED ORDER — WATER FOR IRRIGATION, STERILE IR SOLN
Status: DC | PRN
  Administered 2023-11-06: 2000 mL

## 2023-11-06 MED ORDER — METHOCARBAMOL 1000 MG/10ML IJ SOLN: 500.0000 mg | Freq: Four times a day (QID) | INTRAMUSCULAR | Status: DC | PRN

## 2023-11-06 MED ORDER — BUPIVACAINE LIPOSOME 1.3 % IJ SUSP
INTRAMUSCULAR | Status: DC | PRN
  Administered 2023-11-06: 20 mL

## 2023-11-06 MED ORDER — KETOROLAC TROMETHAMINE 15 MG/ML IJ SOLN
7.5000 mg | Freq: Four times a day (QID) | INTRAMUSCULAR | Status: AC
  Administered 2023-11-06 – 2023-11-07 (×4): 7.5 mg via INTRAVENOUS
  Filled 2023-11-06 (×4): qty 1

## 2023-11-06 MED ORDER — DROPERIDOL 2.5 MG/ML IJ SOLN: 0.6250 mg | Freq: Once | INTRAMUSCULAR | Status: DC | PRN

## 2023-11-06 MED ORDER — OXYCODONE HCL 5 MG PO TABS: 5.0000 mg | ORAL_TABLET | Freq: Once | ORAL | Status: DC | PRN

## 2023-11-06 MED ORDER — ACETAMINOPHEN 500 MG PO TABS
1000.0000 mg | ORAL_TABLET | Freq: Four times a day (QID) | ORAL | Status: AC
  Administered 2023-11-06 – 2023-11-07 (×4): 1000 mg via ORAL
  Filled 2023-11-06 (×4): qty 2

## 2023-11-06 MED ORDER — DEXAMETHASONE SOD PHOSPHATE PF 10 MG/ML IJ SOLN
8.0000 mg | Freq: Once | INTRAMUSCULAR | Status: AC
Start: 2023-11-06 — End: 2023-11-06
  Administered 2023-11-06: 4 mg via INTRAVENOUS

## 2023-11-06 MED ORDER — ONDANSETRON HCL 4 MG/2ML IJ SOLN
4.0000 mg | Freq: Four times a day (QID) | INTRAMUSCULAR | Status: DC | PRN
  Administered 2023-11-06 – 2023-11-07 (×3): 4 mg via INTRAVENOUS
  Filled 2023-11-06 (×3): qty 2

## 2023-11-06 MED ORDER — FENTANYL CITRATE (PF) 50 MCG/ML IJ SOSY
50.0000 ug | PREFILLED_SYRINGE | Freq: Once | INTRAMUSCULAR | Status: AC
  Administered 2023-11-06: 50 ug via INTRAVENOUS
  Filled 2023-11-06: qty 2

## 2023-11-06 MED ORDER — LACTATED RINGERS IV SOLN: INTRAVENOUS | Status: DC

## 2023-11-06 MED ORDER — BUPIVACAINE-EPINEPHRINE (PF) 0.25% -1:200000 IJ SOLN
INTRAMUSCULAR | Status: AC
  Filled 2023-11-06: qty 30

## 2023-11-06 MED ORDER — DOCUSATE SODIUM 100 MG PO CAPS
100.0000 mg | ORAL_CAPSULE | Freq: Two times a day (BID) | ORAL | Status: DC
  Administered 2023-11-06 – 2023-11-08 (×4): 100 mg via ORAL
  Filled 2023-11-06 (×4): qty 1

## 2023-11-06 MED ORDER — ACETAMINOPHEN 325 MG PO TABS
325.0000 mg | ORAL_TABLET | Freq: Four times a day (QID) | ORAL | Status: DC | PRN
  Administered 2023-11-08 (×2): 650 mg via ORAL
  Filled 2023-11-06 (×2): qty 2

## 2023-11-06 MED ORDER — OXYCODONE HCL 5 MG PO TABS: 5.0000 mg | ORAL_TABLET | ORAL | 0 refills | Status: AC | PRN

## 2023-11-06 MED ORDER — CLONIDINE HCL (ANALGESIA) 100 MCG/ML EP SOLN
EPIDURAL | Status: DC | PRN
  Administered 2023-11-06: 100 ug

## 2023-11-06 MED ORDER — CELECOXIB 100 MG PO CAPS: 100.0000 mg | ORAL_CAPSULE | Freq: Two times a day (BID) | ORAL | 0 refills | Status: AC

## 2023-11-06 MED ORDER — CEFAZOLIN SODIUM-DEXTROSE 2-4 GM/100ML-% IV SOLN
2.0000 g | Freq: Four times a day (QID) | INTRAVENOUS | Status: AC
  Administered 2023-11-06 (×2): 2 g via INTRAVENOUS
  Filled 2023-11-06 (×2): qty 100

## 2023-11-06 MED ORDER — ONDANSETRON HCL 4 MG/2ML IJ SOLN
INTRAMUSCULAR | Status: DC | PRN
  Administered 2023-11-06: 4 mg via INTRAVENOUS

## 2023-11-06 MED ORDER — BUPIVACAINE LIPOSOME 1.3 % IJ SUSP: 20.0000 mL | Freq: Once | INTRAMUSCULAR | Status: DC

## 2023-11-06 MED ORDER — BUPIVACAINE-EPINEPHRINE (PF) 0.25% -1:200000 IJ SOLN
INTRAMUSCULAR | Status: DC | PRN
  Administered 2023-11-06: 30 mL

## 2023-11-06 MED ORDER — LIDOCAINE HCL (CARDIAC) PF 100 MG/5ML IV SOSY
PREFILLED_SYRINGE | INTRAVENOUS | Status: DC | PRN
  Administered 2023-11-06: 40 mg via INTRAVENOUS

## 2023-11-06 MED ORDER — BRIMONIDINE TARTRATE 0.2 % OP SOLN: 1.0000 [drp] | Freq: Every day | OPHTHALMIC | Status: DC | PRN

## 2023-11-06 MED ORDER — PHENOL 1.4 % MT LIQD: 1.0000 | OROMUCOSAL | Status: DC | PRN

## 2023-11-06 MED ORDER — ACETAMINOPHEN 10 MG/ML IV SOLN
INTRAVENOUS | Status: DC | PRN
  Administered 2023-11-06: 1000 mg via INTRAVENOUS

## 2023-11-06 MED ORDER — SODIUM CHLORIDE (PF) 0.9 % IJ SOLN
INTRAMUSCULAR | Status: AC
  Filled 2023-11-06: qty 30

## 2023-11-06 MED ORDER — MIDAZOLAM HCL (PF) 2 MG/2ML IJ SOLN: 1.0000 mg | Freq: Once | INTRAMUSCULAR | Status: DC

## 2023-11-06 MED ORDER — LIDOCAINE HCL (PF) 2 % IJ SOLN
INTRAMUSCULAR | Status: AC
Start: 2023-11-06 — End: 2023-11-06
  Filled 2023-11-06: qty 5

## 2023-11-06 MED ORDER — ONDANSETRON HCL 4 MG PO TABS: 4.0000 mg | ORAL_TABLET | Freq: Four times a day (QID) | ORAL | Status: DC | PRN

## 2023-11-06 MED ORDER — METHOCARBAMOL 500 MG PO TABS
500.0000 mg | ORAL_TABLET | Freq: Four times a day (QID) | ORAL | Status: DC | PRN
  Administered 2023-11-06 – 2023-11-08 (×5): 500 mg via ORAL
  Filled 2023-11-06 (×5): qty 1

## 2023-11-06 MED ORDER — ORAL CARE MOUTH RINSE: 15.0000 mL | Freq: Once | OROMUCOSAL | Status: AC

## 2023-11-06 MED ORDER — POVIDONE-IODINE 10 % EX SWAB: 2.0000 | Freq: Once | CUTANEOUS | Status: DC

## 2023-11-06 MED ORDER — ACETAMINOPHEN 500 MG PO TABS: 1000.0000 mg | ORAL_TABLET | Freq: Three times a day (TID) | ORAL | Status: AC | PRN

## 2023-11-06 MED ORDER — CEFAZOLIN SODIUM-DEXTROSE 2-4 GM/100ML-% IV SOLN
2.0000 g | INTRAVENOUS | Status: AC
Start: 2023-11-06 — End: 2023-11-06
  Administered 2023-11-06: 2 g via INTRAVENOUS
  Filled 2023-11-06: qty 100

## 2023-11-06 MED ORDER — SODIUM CHLORIDE 0.9 % IR SOLN
Status: DC | PRN
  Administered 2023-11-06: 3000 mL

## 2023-11-06 MED ORDER — PHENYLEPHRINE HCL-NACL 20-0.9 MG/250ML-% IV SOLN
INTRAVENOUS | Status: DC | PRN
  Administered 2023-11-06: 20 ug/min via INTRAVENOUS

## 2023-11-06 MED ORDER — ASPIRIN 81 MG PO TBEC: 81.0000 mg | DELAYED_RELEASE_TABLET | Freq: Two times a day (BID) | ORAL | Status: AC

## 2023-11-06 MED ORDER — PROPOFOL 10 MG/ML IV BOLUS
INTRAVENOUS | Status: DC | PRN
  Administered 2023-11-06: 20 mg via INTRAVENOUS
  Administered 2023-11-06: 10 mg via INTRAVENOUS

## 2023-11-06 MED ORDER — ACETAMINOPHEN 500 MG PO TABS
1000.0000 mg | ORAL_TABLET | Freq: Once | ORAL | Status: DC
  Filled 2023-11-06: qty 2

## 2023-11-06 MED ORDER — BUPIVACAINE LIPOSOME 1.3 % IJ SUSP
INTRAMUSCULAR | Status: AC
  Filled 2023-11-06: qty 20

## 2023-11-06 MED ORDER — HYDROMORPHONE HCL 1 MG/ML IJ SOLN: 0.5000 mg | INTRAMUSCULAR | Status: DC | PRN

## 2023-11-06 MED ORDER — SODIUM CHLORIDE (PF) 0.9 % IJ SOLN
INTRAMUSCULAR | Status: DC | PRN
  Administered 2023-11-06: 30 mL

## 2023-11-06 MED ORDER — PROPOFOL 500 MG/50ML IV EMUL
INTRAVENOUS | Status: DC | PRN
  Administered 2023-11-06: 75 ug/kg/min via INTRAVENOUS

## 2023-11-06 MED ORDER — SODIUM CHLORIDE 0.9 % IV SOLN: INTRAVENOUS | Status: DC

## 2023-11-06 MED ORDER — FENTANYL CITRATE (PF) 50 MCG/ML IJ SOSY
PREFILLED_SYRINGE | INTRAMUSCULAR | Status: AC
  Filled 2023-11-06: qty 2

## 2023-11-06 MED ORDER — POLYETHYLENE GLYCOL 3350 17 G PO PACK: 17.0000 g | PACK | Freq: Every day | ORAL | 0 refills | Status: AC

## 2023-11-06 MED ORDER — ONDANSETRON HCL 4 MG/2ML IJ SOLN
INTRAMUSCULAR | Status: AC
  Filled 2023-11-06: qty 2

## 2023-11-06 MED ORDER — BUPIVACAINE-EPINEPHRINE (PF) 0.5% -1:200000 IJ SOLN
INTRAMUSCULAR | Status: DC | PRN
  Administered 2023-11-06: 15 mL via PERINEURAL

## 2023-11-06 MED ORDER — TRANEXAMIC ACID-NACL 1000-0.7 MG/100ML-% IV SOLN
1000.0000 mg | INTRAVENOUS | Status: AC
  Administered 2023-11-06: 1000 mg via INTRAVENOUS
  Filled 2023-11-06: qty 100

## 2023-11-06 MED ORDER — FENTANYL CITRATE (PF) 50 MCG/ML IJ SOSY
PREFILLED_SYRINGE | INTRAMUSCULAR | Status: AC
  Filled 2023-11-06: qty 1

## 2023-11-06 MED ORDER — BUPIVACAINE IN DEXTROSE 0.75-8.25 % IT SOLN
INTRATHECAL | Status: DC | PRN
  Administered 2023-11-06: 1.6 mL via INTRATHECAL

## 2023-11-06 MED ORDER — GABAPENTIN 100 MG PO CAPS
200.0000 mg | ORAL_CAPSULE | Freq: Every day | ORAL | Status: DC
  Administered 2023-11-06 – 2023-11-07 (×2): 200 mg via ORAL
  Filled 2023-11-06 (×2): qty 2

## 2023-11-06 MED ORDER — ACETAMINOPHEN 10 MG/ML IV SOLN
INTRAVENOUS | Status: AC
  Filled 2023-11-06: qty 100

## 2023-11-06 MED ORDER — MENTHOL 3 MG MT LOZG: 1.0000 | LOZENGE | OROMUCOSAL | Status: DC | PRN

## 2023-11-06 MED ORDER — ZOLPIDEM TARTRATE 5 MG PO TABS
5.0000 mg | ORAL_TABLET | Freq: Every evening | ORAL | Status: DC | PRN
Start: 2023-11-06 — End: 2023-11-08

## 2023-11-06 MED ORDER — ASPIRIN 81 MG PO CHEW
81.0000 mg | CHEWABLE_TABLET | Freq: Two times a day (BID) | ORAL | Status: DC
Start: 2023-11-06 — End: 2023-11-08
  Administered 2023-11-06 – 2023-11-08 (×4): 81 mg via ORAL
  Filled 2023-11-06 (×4): qty 1

## 2023-11-06 MED ORDER — 0.9 % SODIUM CHLORIDE (POUR BTL) OPTIME
TOPICAL | Status: DC | PRN
  Administered 2023-11-06: 1000 mL

## 2023-11-06 SURGICAL SUPPLY — 51 items
BAG COUNTER SPONGE SURGICOUNT (BAG) IMPLANT
BLADE SAG 18X100X1.27 (BLADE) ×1 IMPLANT
BLADE SAW SAG 35X64 .89 (BLADE) ×1 IMPLANT
BLADE SAW SGTL 11.0X1.19X90.0M (BLADE) IMPLANT
BNDG COHESIVE 3X5 TAN ST LF (GAUZE/BANDAGES/DRESSINGS) ×1 IMPLANT
BNDG ELASTIC 6X10 VLCR STRL LF (GAUZE/BANDAGES/DRESSINGS) ×1 IMPLANT
BOWL SMART MIX CTS (DISPOSABLE) IMPLANT
CEMENT BONE R 1X40 (Cement) IMPLANT
CHLORAPREP W/TINT 26 (MISCELLANEOUS) ×2 IMPLANT
COMPONENT FEM CMT PRSONA SZ7RT (Joint) IMPLANT
COVER SURGICAL LIGHT HANDLE (MISCELLANEOUS) ×1 IMPLANT
CUFF TRNQT CYL 34X4.125X (TOURNIQUET CUFF) ×1 IMPLANT
DERMABOND ADVANCED .7 DNX12 (GAUZE/BANDAGES/DRESSINGS) ×2 IMPLANT
DRAPE INCISE IOBAN 85X60 (DRAPES) ×1 IMPLANT
DRAPE SHEET LG 3/4 BI-LAMINATE (DRAPES) ×1 IMPLANT
DRAPE U-SHAPE 47X51 STRL (DRAPES) ×1 IMPLANT
DRSG AQUACEL AG ADV 3.5X10 (GAUZE/BANDAGES/DRESSINGS) ×1 IMPLANT
ELECT REM PT RETURN 15FT ADLT (MISCELLANEOUS) ×1 IMPLANT
GAUZE SPONGE 4X4 12PLY STRL (GAUZE/BANDAGES/DRESSINGS) ×1 IMPLANT
GLOVE BIO SURGEON STRL SZ 6.5 (GLOVE) ×2 IMPLANT
GLOVE BIOGEL PI IND STRL 6.5 (GLOVE) ×1 IMPLANT
GLOVE BIOGEL PI IND STRL 8 (GLOVE) ×1 IMPLANT
GLOVE SURG ORTHO 8.0 STRL STRW (GLOVE) ×2 IMPLANT
GOWN STRL REUS W/ TWL XL LVL3 (GOWN DISPOSABLE) ×2 IMPLANT
HOLDER FOLEY CATH W/STRAP (MISCELLANEOUS) ×1 IMPLANT
HOOD PEEL AWAY T7 (MISCELLANEOUS) ×3 IMPLANT
INSERT TIB ASF PERS CD6-7 RT (Insert) IMPLANT
KIT TURNOVER KIT A (KITS) ×1 IMPLANT
MANIFOLD NEPTUNE II (INSTRUMENTS) ×1 IMPLANT
MARKER SKIN DUAL TIP RULER LAB (MISCELLANEOUS) ×1 IMPLANT
NS IRRIG 1000ML POUR BTL (IV SOLUTION) ×1 IMPLANT
PACK TOTAL KNEE CUSTOM (KITS) ×1 IMPLANT
PENCIL SMOKE EVACUATOR (MISCELLANEOUS) ×1 IMPLANT
PIN DRILL HDLS TROCAR 75 4PK (PIN) IMPLANT
SCREW HEADED 33MM KNEE (MISCELLANEOUS) IMPLANT
SCREW HEX HEADED 3.5X27 DISP (ORTHOPEDIC DISPOSABLE SUPPLIES) IMPLANT
SET HNDPC FAN SPRY TIP SCT (DISPOSABLE) ×1 IMPLANT
SOLUTION IRRIG SURGIPHOR (IV SOLUTION) IMPLANT
SOLUTION PRONTOSAN WOUND 350ML (IRRIGATION / IRRIGATOR) IMPLANT
STEM POLY PAT PLY 29M KNEE (Knees) IMPLANT
STEM TIBIA 5 DEG SZ D R KNEE (Knees) IMPLANT
STRIP CLOSURE SKIN 1/2X4 (GAUZE/BANDAGES/DRESSINGS) ×1 IMPLANT
SUT MNCRL AB 3-0 PS2 18 (SUTURE) ×1 IMPLANT
SUT STRATAFIX 14 PDO 48 VLT (SUTURE) ×1 IMPLANT
SUT VIC AB 2-0 CT2 27 (SUTURE) ×2 IMPLANT
SUT VICRYL+ 3-0 36IN CT-1 (SUTURE) IMPLANT
SUTURE STRATFX 0 PDS 27 VIOLET (SUTURE) ×1 IMPLANT
TRAY FOLEY MTR SLVR 14FR STAT (SET/KITS/TRAYS/PACK) IMPLANT
TUBE SUCTION HIGH CAP CLEAR NV (SUCTIONS) ×1 IMPLANT
UNDERPAD 30X36 HEAVY ABSORB (UNDERPADS AND DIAPERS) ×1 IMPLANT
WRAP KNEE MAXI GEL POST OP (GAUZE/BANDAGES/DRESSINGS) ×1 IMPLANT

## 2023-11-06 NOTE — Anesthesia Preprocedure Evaluation (Addendum)
 Anesthesia Evaluation  Patient identified by MRN, date of birth, ID band Patient awake    Reviewed: Allergy & Precautions, NPO status , Patient's Chart, lab work & pertinent test results  History of Anesthesia Complications (+) PONV and history of anesthetic complications  Airway Mallampati: II  TM Distance: >3 FB Neck ROM: Full    Dental no notable dental hx.    Pulmonary former smoker   Pulmonary exam normal        Cardiovascular hypertension, Pt. on medications Normal cardiovascular exam     Neuro/Psych negative neurological ROS     GI/Hepatic Neg liver ROS, hiatal hernia,GERD  Medicated,,Celiac dx   Endo/Other  negative endocrine ROS    Renal/GU negative Renal ROS     Musculoskeletal  (+) Arthritis ,    Abdominal   Peds  Hematology negative hematology ROS (+)   Anesthesia Other Findings   Reproductive/Obstetrics                              Anesthesia Physical Anesthesia Plan  ASA: 2  Anesthesia Plan: Spinal   Post-op Pain Management: Tylenol  PO (pre-op)* and Regional block*   Induction:   PONV Risk Score and Plan: 4 or greater and Treatment may vary due to age or medical condition, Ondansetron , Propofol  infusion and Dexamethasone   Airway Management Planned: Natural Airway and Simple Face Mask  Additional Equipment: None  Intra-op Plan:   Post-operative Plan:   Informed Consent: I have reviewed the patients History and Physical, chart, labs and discussed the procedure including the risks, benefits and alternatives for the proposed anesthesia with the patient or authorized representative who has indicated his/her understanding and acceptance.       Plan Discussed with: CRNA  Anesthesia Plan Comments:          Anesthesia Quick Evaluation

## 2023-11-06 NOTE — Evaluation (Signed)
 Physical Therapy Evaluation Patient Details Name: [name] MRN: [id number] DOB: Apr 02, 1940 Today's Date: 11/06/2023  History of Present Illness  83 yo female presents to therapy s/p R TKA on 11/06/2023 due to failure of conservative measures. Pt PMH includes but is not limited to: SBO, diverticulitis, HTN, hypokalemia, barrett's esophagus, celiac, DJD, and GERD.  Clinical Impression      CATTLEYA DOBRATZ is a 83 y.o. female POD 0 s/p R TKA. Patient reports IND with mobility at baseline. Patient is now limited by functional impairments (see PT problem list below) and requires CGA for bed mobility and CGA for transfers. Patient was able to ambulate 30 feet with RW and CGA level of assist. Patient instructed in exercise to facilitate ROM and circulation to manage edema. Patient will benefit from continued skilled PT interventions to address impairments and progress towards PLOF. Acute PT will follow to progress mobility and stair training in preparation for safe discharge home with family support and OPPT services.     If plan is discharge home, recommend the following: A little help with walking and/or transfers;A little help with bathing/dressing/bathroom;Assistance with cooking/housework;Assist for transportation;Help with stairs or ramp for entrance   Can travel by private vehicle        Equipment Recommendations Rolling walker (2 wheels) (Youth)  Recommendations for Other Services       Functional Status Assessment Patient has had a recent decline in their functional status and demonstrates the ability to make significant improvements in function in a reasonable and predictable amount of time.     Precautions / Restrictions Precautions Precautions: Fall;Knee Restrictions Weight Bearing Restrictions Per Provider Order: Yes RLE Weight Bearing Per Provider Order: Weight bearing as tolerated      Mobility  Bed Mobility Overal bed mobility: Needs Assistance Bed Mobility: Supine to  Sit     Supine to sit: Supervision, HOB elevated     General bed mobility comments: min cues    Transfers Overall transfer level: Needs assistance Equipment used: Rolling walker (2 wheels) Transfers: Sit to/from Stand Sit to Stand: Contact guard assist           General transfer comment: min cues    Ambulation/Gait Ambulation/Gait assistance: Contact guard assist Gait Distance (Feet): 30 Feet Assistive device: Rolling walker (2 wheels) Gait Pattern/deviations: Step-to pattern, Decreased stance time - right, Antalgic, Trunk flexed Gait velocity: decreased     General Gait Details: slight trunk flexion with B UE support at RW to offload R LE in stance phase, pt exhibiting hop to like pattern and self limiting R LE WB, pt reported feeling a little light headed during gait tasks and directed to recliner, pt reproted symtoms resloved for the most part once seated. pt required min cues for RW management and will benefit from youth height RW  Stairs            Wheelchair Mobility     Tilt Bed    Modified Rankin (Stroke Patients Only)       Balance Overall balance assessment: Needs assistance Sitting-balance support: Feet supported Sitting balance-Leahy Scale: Good     Standing balance support: Bilateral upper extremity supported, During functional activity, Reliant on assistive device for balance Standing balance-Leahy Scale: Poor                               Pertinent Vitals/Pain Pain Assessment Pain Assessment: 0-10 Pain Score: 5  Pain Location: R knee  and LE Pain Descriptors / Indicators: Aching, Constant, Operative site guarding Pain Intervention(s): Limited activity within patient's tolerance, Monitored during session, Premedicated before session, Repositioned, Ice applied    Home Living Family/patient expects to be discharged to:: Private residence Living Arrangements: Children Available Help at Discharge: Family Type of Home:  House Home Access: Stairs to enter;Ramped entrance Entrance Stairs-Rails: Left Entrance Stairs-Number of Steps: 3   Home Layout: One level Home Equipment: Rollator (4 wheels)      Prior Function Prior Level of Function : Independent/Modified Independent;Driving             Mobility Comments: IND no AD for all ADLs self care tasks and IADLs       Extremity/Trunk Assessment        Lower Extremity Assessment Lower Extremity Assessment: RLE deficits/detail RLE Deficits / Details: ankle DF/PF 5/5; SLR < 10 degree lag RLE Sensation: WNL    Cervical / Trunk Assessment Cervical / Trunk Assessment: Normal  Communication   Communication Communication: No apparent difficulties    Cognition Arousal: Alert Behavior During Therapy: WFL for tasks assessed/performed   PT - Cognitive impairments: No apparent impairments                         Following commands: Intact       Cueing       General Comments      Exercises Total Joint Exercises Ankle Circles/Pumps: AROM, Both, 10 reps   Assessment/Plan    PT Assessment Patient needs continued PT services  PT Problem List Decreased strength;Decreased range of motion;Decreased activity tolerance;Decreased balance;Decreased mobility;Decreased coordination;Pain       PT Treatment Interventions DME instruction;Gait training;Stair training;Functional mobility training;Therapeutic activities;Therapeutic exercise;Balance training;Neuromuscular re-education;Patient/family education;Modalities    PT Goals (Current goals can be found in the Care Plan section)  Acute Rehab PT Goals Patient Stated Goal: to maintain IND PT Goal Formulation: With patient Time For Goal Achievement: 11/20/23 Potential to Achieve Goals: Good    Frequency 7X/week     Co-evaluation               AM-PAC PT 6 Clicks Mobility  Outcome Measure Help needed turning from your back to your side while in a flat bed without using  bedrails?: None Help needed moving from lying on your back to sitting on the side of a flat bed without using bedrails?: A Little Help needed moving to and from a bed to a chair (including a wheelchair)?: A Little Help needed standing up from a chair using your arms (e.g., wheelchair or bedside chair)?: A Little Help needed to walk in hospital room?: A Little Help needed climbing 3-5 steps with a railing? : A Lot 6 Click Score: 18    End of Session Equipment Utilized During Treatment: Gait belt Activity Tolerance: Patient tolerated treatment well Patient left: in chair;with call bell/phone within reach;with family/visitor present Nurse Communication: Mobility status PT Visit Diagnosis: Unsteadiness on feet (R26.81);Other abnormalities of gait and mobility (R26.89);Muscle weakness (generalized) (M62.81);Difficulty in walking, not elsewhere classified (R26.2);Pain Pain - Right/Left: Right Pain - part of body: Leg;Knee    Time: 8195-8172 PT Time Calculation (min) (ACUTE ONLY): 23 min   Charges:   PT Evaluation $PT Eval Low Complexity: 1 Low PT Treatments $Gait Training: 8-22 mins PT General Charges $$ ACUTE PT VISIT: 1 Visit         [place], PT Acute Rehab   [place] VEAR Drone 11/06/2023, 7:11 PM

## 2023-11-06 NOTE — Op Note (Signed)
 DATE OF SURGERY:  11/06/2023 TIME: 12:25 PM  PATIENT NAME:  [name]   AGE: 83 y.o.    PRE-OPERATIVE DIAGNOSIS: End-stage right knee osteoarthritis  POST-OPERATIVE DIAGNOSIS:  Same  PROCEDURE: Cemented right total Knee Arthroplasty  SURGEON:  Roshun Klingensmith A Ron Junco, MD   ASSISTANT: Bernarda Mclean, PA-C, present and scrubbed throughout the case, critical for assistance with exposure, retraction, instrumentation, and closure.   OPERATIVE IMPLANTS:  Cemented Zimmer persona size 7 standard CR femur, right D tibial baseplate, 11 mm MC poly, 29 mm all poly patella Implant Name Type Inv. Item Serial No. Manufacturer Lot No. LRB No. Used Action  CEMENT BONE R 1X40 - [id number] Cement CEMENT BONE R 1X40  ZIMMER [employer]ON(ORTH,TRAU,BIO,SG) [id number] Right 2 Implanted  STEM TIBIA 5 DEG SZ D R KNEE - [id number] Knees STEM TIBIA 5 DEG SZ D R KNEE  ZIMMER [employer]ON(ORTH,TRAU,BIO,SG) [id number] Right 1 Implanted  COMPONENT FEM CMT PRSONA SZ7RT - [id number] Joint COMPONENT FEM CMT PRSONA SZ7RT  ZIMMER [employer]ON(ORTH,TRAU,BIO,SG) [id number] Right 1 Implanted  STEM POLY PAT PLY 71M KNEE - [id number] Knees STEM POLY PAT PLY 71M KNEE  ZIMMER RECON(ORTH,TRAU,BIO,SG) [id number] Right 1 Implanted  INSERT TIB ASF PERS CD6-7 RT - [id number] Insert INSERT TIB ASF PERS CD6-7 RT  ZIMMER RECON(ORTH,TRAU,BIO,SG) [id number] Right 1 Implanted      PREOPERATIVE INDICATIONS:  Kara TROSPER is a 83 y.o. year old female with end stage bone on bone degenerative arthritis of the knee who failed conservative treatment, including injections, antiinflammatories, activity modification, and assistive devices, and had significant impairment of their activities of daily living, and elected for Total Knee Arthroplasty.   The risks, benefits, and alternatives were discussed at length including but not limited to the risks of infection, bleeding, nerve injury, stiffness, blood clots, the need for revision surgery, cardiopulmonary complications,  among others, and they were willing to proceed.  ESTIMATED BLOOD LOSS: 50cc  OPERATIVE DESCRIPTION:   Once adequate anesthesia was induced, preoperative antibiotics, 2 gm of ancef ,1 gm of Tranexamic Acid, and 8 mg of Decadron  administered, the patient was positioned supine with a right thigh tourniquet placed.  The right lower extremity was prepped and draped in sterile fashion.  A time-  out was performed identifying the patient, planned procedure, and the appropriate extremity.     The leg was  exsanguinated, tourniquet elevated to 250 mmHg.  A midline incision was  made followed by median parapatellar arthrotomy. Anterior horn of the medial meniscus was released and resected. A medial release was performed, the infrapatellar fat pad was resected with care taken to protect the patellar tendon. The suprapatellar fat was removed to exposed the distal anterior femur. The anterior horn of the lateral meniscus and ACL were released.    Following initial  exposure, I first started with the femur  The femoral  canal was opened with a drill, canal was suctioned to try to prevent fat emboli.  An  intramedullary rod was passed set at 6 degrees valgus, 10 mm. The distal femur was resected.  Following this resection, the tibia was  subluxated anteriorly.  Using the extramedullary guide, 10 mm of bone was resected off   the proximal lateral tibia.  We confirmed the gap would be  stable medially and laterally with a size 10mm spacer block as well as confirmed that the tibial cut was perpendicular in the coronal plane, checking with an alignment rod.    Once this was done, the posterior femoral referencing femoral sizer was placed under  to the posterior condyles with 3 degrees of external rotational which was parallel to the transepicondylar axis and perpendicular to [employer]. The femur was sized to be a size 7 in the anterior-  posterior dimension. The  anterior, posterior, and  chamfer cuts were made  without difficulty nor   notching making certain that I was along the anterior cortex to help  with flexion gap stability. Next a laminar spreader was placed with the knee in flexion and the medial lateral menisci were resected.  5 cc of the Exparel  mixture was injected in the medial side of the back of the knee and 3 cc in the lateral side.  1/2 inch curved osteotome was used to resect posterior osteophyte that was then removed with a pituitary rongeur.       At this point, the tibia was sized to be a size D.  The size D tray was  then pinned in position. Trial reduction was now carried with a 7 femur, D tibia, a 10 mm MC insert.  The knee was tight laterally and the IT band was pie crusted with a 15 scalpel.  The insert was then upsized to 11 mm.  The knee had full extension and was stable to varus valgus stress in extension.  The knee was slightly tight in flexion and the PCL was partially released.    Attention was next directed to the patella.  Precut  measurement was noted to be 21 mm.  I resected down to 14 mm and used a 29mm  patellar button to restore patellar height as well as cover the cut surface.     The patella lug holes were drilled and a 29 mm patella poly trial was placed.    The knee was brought to full extension with good flexion stability with the patella tracking through the trochlea without application of pressure.     Next the femoral component was again assessed and determined to be seated and appropriately lateralized.  The femoral lug holes were drilled.  The femoral component was then removed. Tibial component was again assessed and felt to be seated and appropriately rotated with the medial third of the tubercle. The tibia was then drilled, and keel punched.     Final components were  opened and cement was mixed.      Final implants were then  cemented onto cleaned and dried cut surfaces of bone with the knee brought to extension with a 11 mm MC poly.  The knee was  irrigated with sterile Betadine diluted in saline as well as pulse lavage normal saline.  The synovial lining was  then injected a dilute Exparel  with 30cc of 0.25% marcaine  with epinephrine.         Once the cement had fully cured, excess cement was removed throughout the knee.  I confirmed that I was satisfied with the range of motion and stability, and the final 11mm MC poly insert was chosen.  It was placed into the knee.         The tourniquet had been let down at 71 minutes.  No significant hemostasis was required.  The medial parapatellar arthrotomy was then reapproximated using #1 Stratafix sutures with the knee  in flexion.  The remaining wound was closed with 0 stratafix, 2-0 Vicryl, and running 3-0 Monocryl. The knee was cleaned, dried, dressed sterilely using Dermabond and   Aquacel dressing.  The patient was then brought to recovery room in stable condition, tolerating the procedure  well. There were no complications.   Post op recs: WB: WBAT Abx: ancef  Imaging: PACU xrays DVT prophylaxis: Aspirin  81mg  BID x4 weeks Follow up: 2 weeks after surgery for a wound check with Dr. Edna at [facility].  Address: [address], [place], [place] [address]  Office Phone: [phone]  Toribio Edna, MD Orthopaedic Surgery

## 2023-11-06 NOTE — Progress Notes (Signed)
 Orthopedic Tech Progress Note Patient Details:  [name] [birth year] [id number] Bone foam left at bedside as X-ray was in progress.  Ortho Devices Type of Ortho Device: Bone foam zero knee Ortho Device/Splint Location: RLE Ortho Device/Splint Interventions: Ordered, Application, Adjustment   Post Interventions Patient Tolerated: Well Instructions Provided: Adjustment of device, Care of device, Poper ambulation with device  [name] 11/06/2023, 1:50 PM

## 2023-11-06 NOTE — Anesthesia Procedure Notes (Addendum)
 Procedure Name: MAC Date/Time: 11/06/2023 10:27 AM  Performed by: Metta Andrea NOVAK, CRNAPre-anesthesia Checklist: Patient identified, Emergency Drugs available, Suction available, Patient being monitored and Timeout performed Oxygen Delivery Method: Simple face mask Placement Confirmation: positive ETCO2

## 2023-11-06 NOTE — Transfer of Care (Signed)
 Immediate Anesthesia Transfer of Care Note  Patient: [name]  Procedure(s) Performed: ARTHROPLASTY, KNEE, TOTAL (Right: Knee)  Patient Location: PACU  Anesthesia Type:Spinal  Level of Consciousness: drowsy and responds to stimulation  Airway & Oxygen Therapy: Patient Spontanous Breathing and Patient connected to face mask oxygen  Post-op Assessment: Report given to RN and Post -op Vital signs reviewed and stable  Post vital signs: Reviewed and stable  Last Vitals:  Vitals Value Taken Time  BP 108/52 11/06/23 12:50  Temp    Pulse 65 11/06/23 12:53  Resp 16 11/06/23 12:53  SpO2 100 % 11/06/23 12:53  Vitals shown include unfiled device data.  Last Pain:  Vitals:   11/06/23 1010  TempSrc:   PainSc: 0-No pain         Complications: No notable events documented.

## 2023-11-06 NOTE — Plan of Care (Signed)
  Problem: Activity: Goal: Risk for activity intolerance will decrease Outcome: Progressing   Problem: Nutrition: Goal: Adequate nutrition will be maintained Outcome: Progressing   Problem: Coping: Goal: Level of anxiety will decrease Outcome: Progressing   Problem: Pain Managment: Goal: General experience of comfort will improve and/or be controlled Outcome: Progressing   Problem: Safety: Goal: Ability to remain free from injury will improve Outcome: Progressing

## 2023-11-06 NOTE — Anesthesia Procedure Notes (Signed)
 Anesthesia Regional Block: Adductor canal block   Pre-Anesthetic Checklist: , timeout performed,  Correct Patient, Correct Site, Correct Laterality,  Correct Procedure, Correct Position, site marked,  Risks and benefits discussed,  Pre-op evaluation,  At surgeon's request and post-op pain management  Laterality: Right  Prep: Maximum Sterile Barrier Precautions used, chloraprep       Needles:  Injection technique: Single-shot  Needle Type: Echogenic Stimulator Needle     Needle Length: 9cm  Needle Gauge: 22     Additional Needles:   Procedures:,,,, ultrasound used (permanent image in chart),,    Narrative:  Start time: 11/06/2023 10:04 AM End time: 11/06/2023 10:07 AM Injection made incrementally with aspirations every 5 mL.  Performed by: Personally  Anesthesiologist: Paul Lamarr BRAVO, MD  Additional Notes: Risks, benefits, and alternative discussed. Patient gave consent for procedure. Patient prepped and draped in sterile fashion. Sedation administered, patient remains easily responsive to voice. Relevant anatomy identified with ultrasound guidance. Local anesthetic given in 5cc increments with no signs or symptoms of intravascular injection. No pain or paraesthesias with injection. Patient monitored throughout procedure with no signs of LAST or immediate complications. Tolerated well. Ultrasound image placed in chart.  LANEY Paul, MD

## 2023-11-06 NOTE — Anesthesia Postprocedure Evaluation (Signed)
 Anesthesia Post Note  Patient: [name]  Procedure(s) Performed: ARTHROPLASTY, KNEE, TOTAL (Right: Knee)     Patient location during evaluation: PACU Anesthesia Type: Spinal Level of consciousness: awake and alert Pain management: pain level controlled Vital Signs Assessment: post-procedure vital signs reviewed and stable Respiratory status: spontaneous breathing, nonlabored ventilation and respiratory function stable Cardiovascular status: blood pressure returned to baseline Postop Assessment: no apparent nausea or vomiting, spinal receding, no headache and no backache Anesthetic complications: no   No notable events documented.  Last Vitals:  Vitals:   11/06/23 1315 11/06/23 1330  BP: (!) 111/52 (!) 112/54  Pulse: 69 65  Resp: 17 20  Temp:    SpO2: 95% 96%    Last Pain:  Vitals:   11/06/23 1315  TempSrc:   PainSc: 0-No pain                 Vertell Row

## 2023-11-06 NOTE — Interval H&P Note (Signed)
 The patient has been re-examined, and the chart reviewed, and there have been no interval changes to the documented history and physical.    Plan for R TKA for R knee OA  The operative side was examined and the patient was confirmed to have sensation to DPN, SPN, TN intact, Motor EHL, ext, flex 5/5, and DP 2+, PT 2+, No significant edema.   The risks, benefits, and alternatives have been discussed at length with patient and her family and the patient is willing to proceed.  Right knee marked. Consent has been signed.

## 2023-11-06 NOTE — Anesthesia Procedure Notes (Signed)
 Spinal  Patient location during procedure: OR Start time: 11/06/2023 10:29 AM End time: 11/06/2023 10:32 AM Reason for block: surgical anesthesia Staffing Performed: anesthesiologist  Anesthesiologist: Paul Lamarr BRAVO, MD Performed by: Paul Lamarr BRAVO, MD Authorized by: Paul Lamarr BRAVO, MD   Preanesthetic Checklist Completed: patient identified, IV checked, risks and benefits discussed, surgical consent, monitors and equipment checked, pre-op evaluation and timeout performed Spinal Block Patient position: sitting Prep: DuraPrep and site prepped and draped Patient monitoring: continuous pulse ox, blood pressure and heart rate Approach: midline Location: L3-4 Injection technique: single-shot Needle Needle type: Pencan  Needle gauge: 24 G Needle length: 9 cm Assessment Events: CSF return Additional Notes Risks, benefits, and alternative discussed. Patient gave consent to procedure. Prepped and draped in sitting position. Patient sedated but responsive to voice. Clear CSF obtained after one needle pass. Positive terminal aspiration. No pain or paraesthesias with injection. Patient tolerated procedure well. Vital signs stable. LANEY Paul, MD

## 2023-11-06 NOTE — Discharge Instructions (Signed)

## 2023-11-07 ENCOUNTER — Encounter (HOSPITAL_COMMUNITY): Payer: Self-pay | Admitting: Orthopedic Surgery

## 2023-11-07 DIAGNOSIS — M1711 Unilateral primary osteoarthritis, right knee: Secondary | ICD-10-CM | POA: Diagnosis not present

## 2023-11-07 LAB — BASIC METABOLIC PANEL WITH GFR
Anion gap: 9 (ref 5–15)
BUN: 14 mg/dL (ref 8–23)
CO2: 26 mmol/L (ref 22–32)
Calcium: 9 mg/dL (ref 8.9–10.3)
Chloride: 101 mmol/L (ref 98–111)
Creatinine, Ser: 0.69 mg/dL (ref 0.44–1.00)
GFR, Estimated: >60 mL/min (ref >60)
Glucose, Bld: 154 mg/dL — ABNORMAL HIGH (ref 70–99)
Potassium: 4.7 mmol/L (ref 3.5–5.1)
Sodium: 136 mmol/L (ref 135–145)

## 2023-11-07 LAB — CBC
HCT: 36.1 % (ref 36.0–46.0)
Hemoglobin: 11.2 g/dL — ABNORMAL LOW (ref 12.0–15.0)
MCH: 29.2 pg (ref 26.0–34.0)
MCHC: 31 g/dL (ref 30.0–36.0)
MCV: 94 fL (ref 80.0–100.0)
Platelets: 253 K/uL (ref 150–400)
RBC: 3.84 MIL/uL — ABNORMAL LOW (ref 3.87–5.11)
RDW: 14.2 % (ref 11.5–15.5)
WBC: 11.9 K/uL — ABNORMAL HIGH (ref 4.0–10.5)
nRBC: 0 % (ref 0.0–0.2)

## 2023-11-07 MED ORDER — SODIUM CHLORIDE 0.9 % IV SOLN
8.0000 mg | Freq: Four times a day (QID) | INTRAVENOUS | Status: DC | PRN
  Administered 2023-11-07: 8 mg via INTRAVENOUS
  Filled 2023-11-07: qty 8

## 2023-11-07 MED ORDER — ONDANSETRON HCL 4 MG PO TABS
8.0000 mg | ORAL_TABLET | Freq: Four times a day (QID) | ORAL | Status: DC | PRN
  Filled 2023-11-07: qty 2

## 2023-11-07 MED ORDER — ONDANSETRON HCL 4 MG PO TABS
8.0000 mg | ORAL_TABLET | Freq: Four times a day (QID) | ORAL | Status: DC | PRN
  Administered 2023-11-07 – 2023-11-08 (×3): 8 mg via ORAL
  Filled 2023-11-07 (×3): qty 2

## 2023-11-07 MED ORDER — ONDANSETRON HCL 4 MG/2ML IJ SOLN: 4.0000 mg | Freq: Four times a day (QID) | INTRAMUSCULAR | Status: DC | PRN

## 2023-11-07 NOTE — Plan of Care (Signed)

## 2023-11-07 NOTE — Progress Notes (Signed)
 Physical Therapy Treatment Patient Details Name: [name] MRN: [id number] DOB: 11/06/1940 Today's Date: 11/07/2023   History of Present Illness 83 yo female presents to therapy s/p R TKA on 11/06/2023 due to failure of conservative measures. Pt PMH includes but is not limited to: SBO, diverticulitis, HTN, hypokalemia, barrett's esophagus, celiac, DJD, and GERD.    PT Comments  POD # 1 am session Cognition Comments: AxO x 3 sweet Lady with very supportive daughter at bedside. Pt lives home alone and was IND/Driving/shopping.  Pt plans to D/C to her home with Family support. On arrival, Pt was OOB on [facility] by Daughter.  Assisted off.  General transfer comment: 50% VC's on proper hand placment to push up from [facility] vs pull up on walker.  Assisted with peri care as Pt was unable to achieve a steady static balance to self perform.  Required both hands on walker. General Gait Details: Assisted with amb a limited distance of 26 feet with walker and recliner following as a precaution.  Pt reports no sleep last night and a little queezy.  Not able to eat much breakfast.  Daughter present and assisted/supportive.  Required VC's for proper upright posture and proper walker to self distance.  Pt c/o 7/10 knee pain. Pt returned to room in recliner and performed a few TE's followed by ICE. Pt required an extended treatment time for rest breaks and repeat positive reinforcement.  Pt feeling bad with c/o poor sleep last night and poor appetite.  Daughter stated, Pt is very sensitive to pain medication.  Performed a few TKR TE's followed by ICE. Will see Pt again this afternoon for stair training and to complete HEP program.     If plan is discharge home, recommend the following: A little help with walking and/or transfers;A little help with bathing/dressing/bathroom;Assistance with cooking/housework;Assist for transportation;Help with stairs or ramp for entrance   Can travel by private vehicle         Equipment Recommendations  Rolling walker (2 wheels) (Youth)    Recommendations for Other Services       Precautions / Restrictions Precautions Precautions: Fall;Knee Precaution/Restrictions Comments: no pillow under knee Restrictions Weight Bearing Restrictions Per Provider Order: No RLE Weight Bearing Per Provider Order: Weight bearing as tolerated     Mobility  Bed Mobility               General bed mobility comments: OOB on BSC    Transfers Overall transfer level: Needs assistance Equipment used: Rolling walker (2 wheels) Transfers: Sit to/from Stand Sit to Stand: Contact guard assist, Supervision           General transfer comment: 50% VC's on proper hand placment to push up from [facility] vs pull up on walker.  Assisted with peri care as Pt was unable to achieve a steady static balance to self perform.  Required both hands on walker.    Ambulation/Gait Ambulation/Gait assistance: Contact guard assist, Supervision Gait Distance (Feet): 26 Feet Assistive device: Rolling walker (2 wheels) Gait Pattern/deviations: Step-to pattern, Decreased stance time - right, Antalgic, Trunk flexed Gait velocity: decreased     General Gait Details: Assisted with amb a limited distance of 26 feet with walker and recliner following as a precaution.  Pt reports no sleep last night and a little queezy.  Not able to eat much breakfast.  Daughter present and assisted/supportive.  Required VC's for proper upright posture and proper walker to self distance.  Pt c/o 7/10 knee pain.  Stairs             Wheelchair Mobility     Tilt Bed    Modified Rankin (Stroke Patients Only)       Balance                                            Communication Communication Communication: No apparent difficulties  Cognition Arousal: Alert Behavior During Therapy: WFL for tasks assessed/performed   PT - Cognitive impairments: No apparent impairments                        PT - Cognition Comments: AxO x 3 sweet Lady with very supportive daughter at bedside. Pt lives home alone and was IND/Driving/shopping Following commands: Intact      Cueing    Exercises  Total Knee Replacement TE's following HEP handout 10 reps B LE ankle pumps 05 reps towel squeezes 05 reps knee presses 05 reps heel slides   Educated on use of gait belt to assist with TE's Followed by ICE     General Comments        Pertinent Vitals/Pain Pain Assessment Pain Assessment: 0-10 Pain Score: 5  Pain Location: R knee with activity Pain Descriptors / Indicators: Aching, Constant, Operative site guarding Pain Intervention(s): Monitored during session, Premedicated before session, Repositioned, Ice applied    Home Living                          Prior Function            PT Goals (current goals can now be found in the care plan section) Progress towards PT goals: Progressing toward goals    Frequency    7X/week      PT Plan      Co-evaluation              AM-PAC PT 6 Clicks Mobility   Outcome Measure  Help needed turning from your back to your side while in a flat bed without using bedrails?: A Little Help needed moving from lying on your back to sitting on the side of a flat bed without using bedrails?: A Little Help needed moving to and from a bed to a chair (including a wheelchair)?: A Little Help needed standing up from a chair using your arms (e.g., wheelchair or bedside chair)?: A Little Help needed to walk in hospital room?: A Little Help needed climbing 3-5 steps with a railing? : A Lot 6 Click Score: 17    End of Session Equipment Utilized During Treatment: Gait belt Activity Tolerance: Patient limited by fatigue;Patient limited by pain Patient left: in chair;with call bell/phone within reach;with family/visitor present Nurse Communication: Mobility status PT Visit Diagnosis: Unsteadiness on feet  (R26.81);Other abnormalities of gait and mobility (R26.89);Muscle weakness (generalized) (M62.81);Difficulty in walking, not elsewhere classified (R26.2);Pain Pain - Right/Left: Right Pain - part of body: Leg;Knee     Time: 8846-8768 PT Time Calculation (min) (ACUTE ONLY): 38 min  Charges:    $Gait Training: 8-22 mins $Therapeutic Exercise: 8-22 mins $Therapeutic Activity: 8-22 mins                       Katheryn Leap  PTA Acute  Rehabilitation Services Office M-F          [phone]

## 2023-11-07 NOTE — Care Management Obs Status (Signed)
 MEDICARE OBSERVATION STATUS NOTIFICATION   Patient Details  Name: [name] MRN: [id number] Date of Birth: [birth year]   Medicare Observation Status Notification Given:  Yes    NORMAN ASPEN, LCSW 11/07/2023, 2:22 PM

## 2023-11-07 NOTE — TOC Transition Note (Signed)
 Transition of Care [facility]) - Discharge Note   Patient Details  Name: ANNALINA NEEDLES MRN: [id number] Date of Birth: 1940/12/19  [facility] of [facility] CM/SW Contact:  NORMAN ASPEN, LCSW Phone Number: 11/07/2023, 10:35 AM   Clinical Narrative:     Met with pt and daughter who confirms pt does need a (youth) RW and no DME agency preference.  Order placed with [employer] and has been delivered to room.  Pt/daughter aware HHPT prearranged with [facility] via ortho MD office prior to surgery.  No further IP CM needs.  Final next level of care: Home w Home Health Services Barriers to Discharge: No Barriers Identified   Patient Goals and CMS Choice Patient states their goals for this hospitalization and ongoing recovery are:: return home          Discharge Placement                       Discharge Plan and Services Additional resources added to the After Visit Summary for                  DME Arranged: Walker rolling (youth RW) DME Agency: [employer] Date DME Agency Contacted: 11/07/23 Time DME Agency Contacted: 0930 Representative spoke with at DME Agency: [facility] HH Arranged: PT HH Agency: [facility] ([facility])        Social Drivers of Health (SDOH) Interventions SDOH Screenings   Food Insecurity: No Food Insecurity (11/06/2023)  Housing: Low Risk  (11/06/2023)  Transportation Needs: No Transportation Needs (11/06/2023)  Utilities: Not At Risk (11/06/2023)  Social Connections: Moderately Isolated (11/06/2023)  Tobacco Use: Medium Risk (11/06/2023)     Readmission Risk Interventions     No data to display

## 2023-11-07 NOTE — Progress Notes (Signed)
 PM PT Session Cancellation Note  Patient Details Name: [name] MRN: [id number] DOB: 1941-01-07   Cancelled Treatment:     Attempted to see twice this afternoon.   Active vomiting x 2  Just got back to bed from [facility] with NT and needing to rest  Pt did NOT meet her mobility goals to D/C today due to nausea/vomiting and inability to attempt stair training.  Rehab Team to see tomorrow   Katheryn Leap  PTA Acute  Rehabilitation Services Office M-F          [phone]

## 2023-11-07 NOTE — Discharge Summary (Signed)
 Physician Discharge Summary  Patient ID: [name] MRN: [id number] DOB/AGE: [birth year] 83 y.o.  Admit date: 11/06/2023 Discharge date: 11/08/2023  Admission Diagnoses:  Primary osteoarthritis of right knee  Discharge Diagnoses:  Principal Problem:   Primary osteoarthritis of right knee   Past Medical History:  Diagnosis Date   Anemia    as child   Barrett's esophagus    Blood in urine    has symptoms of UTI- burning and aching lower abdomen   Celiac disease    per Dr. Aneita   Colovesical fistula    Complication of anesthesia 01/11/1964   adverse reaction to saddle block   Degenerative joint disease    knees   Diverticulitis    and diverticulosis in sigmoid colon    Erosive esophagitis    GERD (gastroesophageal reflux disease)    Heart murmur    DOES NOT CAUSE ANY PROBLEMS   Hiatal hernia    Hypertension    Pneumonia    PONV (postoperative nausea and vomiting)    Pre-diabetes    Rheumatic fever    as child   Shingles     Surgeries: Procedure(s): ARTHROPLASTY, KNEE, TOTAL on 11/06/2023   Consultants (if any):   Discharged Condition: Improved  Hospital Course: [name] is an 83 y.o. female who was admitted 11/06/2023 with a diagnosis of Primary osteoarthritis of right knee and went to the operating room on 11/06/2023 and underwent the above named procedures.  Some initial difficulty with post-operative nausea and vomiting, improved with conservative and pharmacological interventions.  Mobilized well with physical therapy.  She was given perioperative antibiotics:  Anti-infectives (From admission, onward)    Start     Dose/Rate Route Frequency Ordered Stop   11/06/23 1630  ceFAZolin  (ANCEF ) IVPB 2g/100 mL premix        2 g 200 mL/hr over 30 Minutes Intravenous Every 6 hours 11/06/23 1540 11/06/23 2210   11/06/23 0800  ceFAZolin  (ANCEF ) IVPB 2g/100 mL premix        2 g 200 mL/hr over 30 Minutes Intravenous On call to O.R. 11/06/23 9243 11/06/23 1033      .  She was given sequential compression devices, early ambulation, and aspirin  for DVT prophylaxis.  She benefited maximally from the hospital stay and there were no complications.    Recent vital signs:  Vitals:   11/07/23 0143 11/07/23 0544  BP: (!) 152/66 (!) 149/64  Pulse: 71 61  Resp: 15 15  Temp: (!) 97.4 F (36.3 C) 97.6 F (36.4 C)  SpO2: 97% 98%    Recent laboratory studies:  Lab Results  Component Value Date   HGB 11.2 (L) 11/07/2023   HGB 12.8 10/30/2023   HGB 13.3 01/09/2022   Lab Results  Component Value Date   WBC 11.9 (H) 11/07/2023   PLT 253 11/07/2023   Lab Results  Component Value Date   INR 0.9 08/02/2020   Lab Results  Component Value Date   NA 136 11/07/2023   K 4.7 11/07/2023   CL 101 11/07/2023   CO2 26 11/07/2023   BUN 14 11/07/2023   CREATININE 0.69 11/07/2023   GLUCOSE 154 (H) 11/07/2023    Discharge Medications:   Allergies as of 11/07/2023       Reactions   Gluten Meal    Diarrhea, vomiting that lasts 3-4 hours   Ace Inhibitors    Coughing   Ciprofloxacin    Joint pain   Covid-19 (mrna) Vaccine    Other  Reaction(s): Bell's Palsy   Ibuprofen    Joint pain   Levofloxacin    Pain in tendons   Losartan  Potassium    Other Reaction(s): BP drops and will get dizzy   Simvastatin Other (See Comments)   Muscle pain        Medication List     STOP taking these medications    ondansetron  4 MG disintegrating tablet Commonly known as: ZOFRAN -ODT   ondansetron  8 MG disintegrating tablet Commonly known as: ZOFRAN -ODT   Tylenol  PM Extra Strength 500-25 MG Tabs tablet Generic drug: diphenhydramine-acetaminophen        TAKE these medications    acetaminophen  500 MG tablet Commonly known as: TYLENOL  Take 2 tablets (1,000 mg total) by mouth every 8 (eight) hours as needed.   amLODipine  5 MG tablet Commonly known as: NORVASC  Take 5 mg by mouth every evening.   aspirin  EC 81 MG tablet Take 1 tablet (81 mg total) by  mouth 2 (two) times daily for 28 days. Swallow whole.   celecoxib 100 MG capsule Commonly known as: CeleBREX Take 1 capsule (100 mg total) by mouth 2 (two) times daily for 14 days.   Cholecalciferol 50 MCG (2000 UT) Caps Take 2,000 Units by mouth daily.   cyanocobalamin  1000 MCG tablet Commonly known as: VITAMIN B12 Take 1,000 mcg by mouth daily.   gabapentin  100 MG capsule Commonly known as: NEURONTIN  Take 2 capsules (200 mg total) by mouth 2 (two) times daily. What changed: when to take this   Lumify 0.025 % Soln Generic drug: Brimonidine Tartrate Place 1 drop into both eyes daily as needed (irritation/redness).   methocarbamol 500 MG tablet Commonly known as: ROBAXIN Take 1 tablet (500 mg total) by mouth every 8 (eight) hours as needed for up to 10 days for muscle spasms.   omeprazole 40 MG capsule Commonly known as: PRILOSEC Take 40 mg by mouth daily as needed (acid reflux).   ondansetron  4 MG tablet Commonly known as: ZOFRAN  Take 1 tablet (4 mg total) by mouth every 6 (six) hours. What changed: Another medication with the same name was added. Make sure you understand how and when to take each.   ondansetron  4 MG tablet Commonly known as: Zofran  Take 1 tablet (4 mg total) by mouth every 8 (eight) hours as needed for up to 14 days for nausea or vomiting. What changed: You were already taking a medication with the same name, and this prescription was added. Make sure you understand how and when to take each.   oxyCODONE  5 MG immediate release tablet Commonly known as: Roxicodone  Take 1 tablet (5 mg total) by mouth every 4 (four) hours as needed for up to 7 days for severe pain (pain score 7-10) or moderate pain (pain score 4-6).   polyethylene glycol 17 g packet Commonly known as: MiraLax  Take 17 g by mouth daily.        Diagnostic Studies: DG Knee Right Port Result Date: 11/06/2023 CLINICAL DATA:  Postop. EXAM: PORTABLE RIGHT KNEE - 1-2 VIEW COMPARISON:  None  Available. FINDINGS: Right knee arthroplasty in expected alignment. No periprosthetic lucency or fracture. There has been patellar resurfacing. Recent postsurgical change includes air and edema in the soft tissues and joint space. IMPRESSION: Right knee arthroplasty without immediate postoperative complication. Electronically Signed   By: Andrea Gasman M.D.   On: 11/06/2023 15:04    Disposition: Discharge disposition: 01-Home or Self Care       Discharge Instructions     Call  MD / Call 911   Complete by: As directed    If you experience chest pain or shortness of breath, CALL 911 and be transported to the hospital emergency room.  If you develope a fever above 101 F, pus (white drainage) or increased drainage or redness at the wound, or calf pain, call your surgeon's office.   Constipation Prevention   Complete by: As directed    Drink plenty of fluids.  Prune juice may be helpful.  You may use a stool softener, such as Colace (over the counter) 100 mg twice a day.  Use MiraLax  (over the counter) for constipation as needed.   Diet - low sodium heart healthy   Complete by: As directed    Do not put a pillow under the knee. Place it under the heel.   Complete by: As directed    Increase activity slowly as tolerated   Complete by: As directed    Post-operative opioid taper instructions:   Complete by: As directed    POST-OPERATIVE OPIOID TAPER INSTRUCTIONS: It is important to wean off of your opioid medication as soon as possible. If you do not need pain medication after your surgery it is ok to stop day one. Opioids include: Codeine, Hydrocodone (Norco, Vicodin), Oxycodone (Percocet, oxycontin ) and hydromorphone  amongst others.  Long term and even short term use of opiods can cause: Increased pain response Dependence Constipation Depression Respiratory depression And more.  Withdrawal symptoms can include Flu like symptoms Nausea, vomiting And more Techniques to manage these  symptoms Hydrate well Eat regular healthy meals Stay active Use relaxation techniques(deep breathing, meditating, yoga) Do Not substitute Alcohol to help with tapering If you have been on opioids for less than two weeks and do not have pain than it is ok to stop all together.  Plan to wean off of opioids This plan should start within one week post op of your joint replacement. Maintain the same interval or time between taking each dose and first decrease the dose.  Cut the total daily intake of opioids by one tablet each day Next start to increase the time between doses. The last dose that should be eliminated is the evening dose.           Follow-up Information     Edna Toribio LABOR, MD. Go on 11/21/2023.   Specialty: Orthopedic Surgery Why: your appointment is scheduled for 2:45. Contact information: [address] [place] [address] [phone]         [facility] Follow up.   Why: HHPT will provide 6 home visits prior to starting outpatient physical therapy        [facility] Go on 11/21/2023.   Why: your outpatient physical therapy has been scheduled. they will contact you with a time Contact information: [facility] [address] [place] [address] [phone]                    Discharge Instructions      INSTRUCTIONS AFTER JOINT REPLACEMENT   Remove items at home which could result in a fall. This includes throw rugs or furniture in walking pathways ICE to the affected joint every three hours while awake for 30 minutes at a time, for at least the first 3-5 days, and then as needed for pain and swelling.  Continue to use ice for pain and swelling. You may notice swelling that will progress down to the foot and ankle.  This  is normal after surgery.  Elevate your leg when you are not up walking on it.   Continue to use the breathing machine you got in the hospital  (incentive spirometer) which will help keep your temperature down.  It is common for your temperature to cycle up and down following surgery, especially at night when you are not up moving around and exerting yourself.  The breathing machine keeps your lungs expanded and your temperature down.  DIET:  As you were doing prior to hospitalization, we recommend a well-balanced diet.  DRESSING / WOUND CARE / SHOWERING:  Keep the surgical dressing until follow up.  The dressing is water proof, so you can shower without any extra covering.  IF THE DRESSING FALLS OFF or the wound gets wet inside, change the dressing with sterile gauze.  Please use good hand washing techniques before changing the dressing.  Do not use any lotions or creams on the incision until instructed by your surgeon.    ACTIVITY  Increase activity slowly as tolerated, but follow the weight bearing instructions below.   No driving for 6 weeks or until further direction given by your physician.  You cannot drive while taking narcotics.  No lifting or carrying greater than 10 lbs. until further directed by your surgeon. Avoid periods of inactivity such as sitting longer than an hour when not asleep. This helps prevent blood clots.  You may return to work once you are authorized by your doctor.   WEIGHT BEARING: Weight bearing as tolerated with assist device (walker, cane, etc) as directed, use it as long as suggested by your surgeon or therapist, typically at least 4-6 weeks.  EXERCISES  Results after joint replacement surgery are often greatly improved when you follow the exercise, range of motion and muscle strengthening exercises prescribed by your doctor. Safety measures are also important to protect the joint from further injury. Any time any of these exercises cause you to have increased pain or swelling, decrease what you are doing until you are comfortable again and then slowly increase them. If you have problems or questions,  call your caregiver or physical therapist for advice.   Rehabilitation is important following a joint replacement. After just a few days of immobilization, the muscles of the leg can become weakened and shrink (atrophy).  These exercises are designed to build up the tone and strength of the thigh and leg muscles and to improve motion. Often times heat used for twenty to thirty minutes before working out will loosen up your tissues and help with improving the range of motion but do not use heat for the first two weeks following surgery (sometimes heat can increase post-operative swelling).   These exercises can be done on a training (exercise) mat, on the floor, on a table or on a bed. Use whatever works the best and is most comfortable for you.    Use music or television while you are exercising so that the exercises are a pleasant break in your day. This will make your life better with the exercises acting as a break in your routine that you can look forward to.   Perform all exercises about fifteen times, three times per day or as directed.  You should exercise both the operative leg and the other leg as well.  Exercises include:   Quad Sets - Tighten up the muscle on the front of the thigh (Quad) and hold for 5-10 seconds.   Straight Leg Raises - With your knee  straight (if you were given a brace, keep it on), lift the leg to 60 degrees, hold for 3 seconds, and slowly lower the leg.  Perform this exercise against resistance later as your leg gets stronger.  Leg Slides: Lying on your back, slowly slide your foot toward your buttocks, bending your knee up off the floor (only go as far as is comfortable). Then slowly slide your foot back down until your leg is flat on the floor again.  Angel Wings: Lying on your back spread your legs to the side as far apart as you can without causing discomfort.  Hamstring Strength:  Lying on your back, push your heel against the floor with your leg straight by  tightening up the muscles of your buttocks.  Repeat, but this time bend your knee to a comfortable angle, and push your heel against the floor.  You may put a pillow under the heel to make it more comfortable if necessary.   A rehabilitation program following joint replacement surgery can speed recovery and prevent re-injury in the future due to weakened muscles. Contact your doctor or a physical therapist for more information on knee rehabilitation.   CONSTIPATION:  Constipation is defined medically as fewer than three stools per week and severe constipation as less than one stool per week.  Even if you have a regular bowel pattern at home, your normal regimen is likely to be disrupted due to multiple reasons following surgery.  Combination of anesthesia, postoperative narcotics, change in appetite and fluid intake all can affect your bowels.   YOU MUST use at least one of the following options; they are listed in order of increasing strength to get the job done.  They are all available over the counter, and you may need to use some, POSSIBLY even all of these options:    Drink plenty of fluids (prune juice may be helpful) and high fiber foods Colace 100 mg by mouth twice a day  Senokot for constipation as directed and as needed Dulcolax (bisacodyl), take with full glass of water  Miralax  (polyethylene glycol) once or twice a day as needed.  If you have tried all these things and are unable to have a bowel movement in the first 3-4 days after surgery call either your surgeon or your primary doctor.    If you experience loose stools or diarrhea, hold the medications until you stool forms back up.  If your symptoms do not get better within 1 week or if they get worse, check with your doctor.  If you experience the worst abdominal pain ever or develop nausea or vomiting, please contact the office immediately for further recommendations for treatment.  ITCHING:  If you experience itching with your  medications, try taking only a single pain pill, or even half a pain pill at a time.  You can also use Benadryl over the counter for itching or also to help with sleep.   TED HOSE STOCKINGS:  Use stockings on both legs until for at least 2 weeks or as directed by physician office. They may be removed at night for sleeping.  MEDICATIONS:  See your medication summary on the "After Visit Summary" that nursing will review with you.  You may have some home medications which will be placed on hold until you complete the course of blood thinner medication.  It is important for you to complete the blood thinner medication as prescribed.  Blood clot prevention (DVT Prophylaxis): After surgery you are at an  increased risk for a blood clot.  You were prescribed a blood thinner, Aspirin  81mg , to be taken twice daily for a total of 4 weeks from surgery to help reduce your risk of getting a blood clot.  Signs of a pulmonary embolus (blood clot in the lungs) include sudden short of breath, feeling lightheaded or dizzy, chest pain with a deep breath, rapid pulse rapid breathing.  Signs of a blood clot in your arms or legs include new unexplained swelling and cramping, warm, red or darkened skin around the painful area.  Please call the office or 911 right away if these signs or symptoms develop.  PRECAUTIONS:   If you experience chest pain or shortness of breath - call 911 immediately for transfer to the hospital emergency department.   If you develop a fever greater that 101 F, purulent drainage from wound, increased redness or drainage from wound, foul odor from the wound/dressing, or calf pain - CONTACT YOUR SURGEON.                                                   FOLLOW-UP APPOINTMENTS:  If you do not already have a post-op appointment, please call the office for an appointment to be seen by your surgeon.  Guidelines for how soon to be seen are listed in your "After Visit Summary", but are typically between 2-3  weeks after surgery.  If you have a specialized bandage, you may be told to follow up 1 week after surgery.  OTHER INSTRUCTIONS:  Knee Replacement:  Do not place pillow under knee, focus on keeping the knee straight while resting.  Place foam block, curve side up under heel at all times except when walking.  DO NOT modify, tear, cut, or change the foam block in any way.  POST-OPERATIVE OPIOID TAPER INSTRUCTIONS: It is important to wean off of your opioid medication as soon as possible. If you do not need pain medication after your surgery it is ok to stop day one. Opioids include: Codeine, Hydrocodone (Norco, Vicodin), Oxycodone (Percocet, oxycontin ) and hydromorphone  amongst others.  Long term and even short term use of opiods can cause: Increased pain response Dependence Constipation Depression Respiratory depression And more.  Withdrawal symptoms can include Flu like symptoms Nausea, vomiting And more Techniques to manage these symptoms Hydrate well Eat regular healthy meals Stay active Use relaxation techniques(deep breathing, meditating, yoga) Do Not substitute Alcohol to help with tapering If you have been on opioids for less than two weeks and do not have pain than it is ok to stop all together.  Plan to wean off of opioids This plan should start within one week post op of your joint replacement. Maintain the same interval or time between taking each dose and first decrease the dose.  Cut the total daily intake of opioids by one tablet each day Next start to increase the time between doses. The last dose that should be eliminated is the evening dose.   MAKE SURE YOU:  Understand these instructions.  Get help right away if you are not doing well or get worse.    Thank you for letting us  be a part of your medical care team.  It is a privilege we respect greatly.  We hope these instructions will help you stay on track for a fast and full recovery!  Signed: Bernarda CHRISTELLA Velazquez 11/08/2023, 7:36 AM

## 2023-11-07 NOTE — Progress Notes (Signed)
     Subjective:  Patient reports pain as mild.  Did well with physical therapy yesterday ambulating 30 feet.  Difficulty sleeping overnight.  Discussed plan for discharge home today pending clearance with physical therapy.  Yesterday's total administered Morphine  Milligram Equivalents: 60   Objective:   VITALS:   Vitals:   11/06/23 1744 11/06/23 1951 11/07/23 0143 11/07/23 0544  BP: (!) 138/55 (!) 132/58 (!) 152/66 (!) 149/64  Pulse: 63 66 71 61  Resp:  16 15 15   Temp:  (!) 97.4 F (36.3 C) (!) 97.4 F (36.3 C) 97.6 F (36.4 C)  TempSrc:  Oral Oral Oral  SpO2:  98% 97% 98%  Weight:      Height:        Sensation intact distally Intact pulses distally Dorsiflexion/Plantar flexion intact Incision: dressing C/D/I Compartment soft    Lab Results  Component Value Date   WBC 11.9 (H) 11/07/2023   HGB 11.2 (L) 11/07/2023   HCT 36.1 11/07/2023   MCV 94.0 11/07/2023   PLT 253 11/07/2023   BMET    Component Value Date/Time   NA 136 11/07/2023 0316   K 4.7 11/07/2023 0316   CL 101 11/07/2023 0316   CO2 26 11/07/2023 0316   GLUCOSE 154 (H) 11/07/2023 0316   BUN 14 11/07/2023 0316   CREATININE 0.69 11/07/2023 0316   CALCIUM 9.0 11/07/2023 0316   GFRNONAA >60 11/07/2023 0316      Xray: Total knee arthroplasty components in good position no adverse features  Assessment/Plan: 1 Day Post-Op   Principal Problem:   Primary osteoarthritis of right knee  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 Edna at [facility].  Address: [address], [place], [place] [address]  Office Phone: [phone]    TORIBIO DELENA EDNA 11/07/2023, 7:08 AM   Toribio Edna, MD  Contact information:   [phone] 7am-5pm epic message Dr. Edna, or call office for patient follow up: [phone]  [phone] After hours and holidays please check [email or web address] for group call information for [facility]

## 2023-11-08 ENCOUNTER — Other Ambulatory Visit (HOSPITAL_COMMUNITY): Payer: Self-pay

## 2023-11-08 DIAGNOSIS — M1711 Unilateral primary osteoarthritis, right knee: Secondary | ICD-10-CM | POA: Diagnosis not present

## 2023-11-08 LAB — CBC
HCT: 38.1 % (ref 36.0–46.0)
Hemoglobin: 11.4 g/dL — ABNORMAL LOW (ref 12.0–15.0)
MCH: 28.6 pg (ref 26.0–34.0)
MCHC: 29.9 g/dL — ABNORMAL LOW (ref 30.0–36.0)
MCV: 95.5 fL (ref 80.0–100.0)
Platelets: 235 K/uL (ref 150–400)
RBC: 3.99 MIL/uL (ref 3.87–5.11)
RDW: 14.3 % (ref 11.5–15.5)
WBC: 7.9 K/uL (ref 4.0–10.5)
nRBC: 0 % (ref 0.0–0.2)

## 2023-11-08 MED ORDER — ONDANSETRON HCL 4 MG PO TABS
4.0000 mg | ORAL_TABLET | Freq: Four times a day (QID) | ORAL | 0 refills | Status: AC | PRN
  Filled 2023-11-08 (×2): qty 28, 7d supply, fill #0
  Filled ????-??-??: fill #0

## 2023-11-08 NOTE — Plan of Care (Signed)
  Problem: Pain Management: Goal: Pain level will decrease with appropriate interventions Outcome: Progressing   Problem: Activity: Goal: Range of joint motion will improve Outcome: Progressing   Problem: Education: Goal: Knowledge of the prescribed therapeutic regimen will improve Outcome: Progressing   Problem: Safety: Goal: Ability to remain free from injury will improve Outcome: Progressing

## 2023-11-08 NOTE — Progress Notes (Signed)
 Physical Therapy Treatment Patient Details Name: [name] MRN: [id number] DOB: 1940/07/24 Today's Date: 11/08/2023   History of Present Illness 83 yo female presents to therapy s/p R TKA on 11/06/2023 due to failure of conservative measures. Pt PMH includes but is not limited to: SBO, diverticulitis, HTN, hypokalemia, barrett's esophagus, celiac, DJD, and GERD.    PT Comments  POD # 2 am session Pt reported a better nights sleep.  AxO x 3 sweet Lady with very supportive daughter at bedside. Pt lives home alone and was IND/Driving/shopping.  Daughter plans to stay with her as long as it takes. Assisted OOB to amb in hallway and practice stairs.  Pt was able to navigate 2 steps using one rail and a cane with Daughter hands on.  Pt plans to D/C to Daughter's home which she has a RAMP. Returned to room and reviewed TE's following HEP handout. Pt's nausea is still present but improved.  Daughter stated, this is just something she has been dealing with.   Pt has met her mobility goals to D/C to home (Daughter's home) with Family support.  Pt receiving HH PT.  Youth walker in room.  Addressed all mobility questions, discussed appropriate activity, educated on use of ICE.     If plan is discharge home, recommend the following: A little help with walking and/or transfers;A little help with bathing/dressing/bathroom;Assistance with cooking/housework;Assist for transportation;Help with stairs or ramp for entrance   Can travel by private vehicle        Equipment Recommendations  Rolling walker (2 wheels)    Recommendations for Other Services       Precautions / Restrictions Precautions Precautions: Fall;Knee Precaution/Restrictions Comments: no pillow under knee Restrictions Weight Bearing Restrictions Per Provider Order: No RLE Weight Bearing Per Provider Order: Weight bearing as tolerated     Mobility  Bed Mobility Overal bed mobility: Needs Assistance Bed Mobility: Supine to  Sit, Sit to Supine     Supine to sit: Supervision, Contact guard Sit to supine: Supervision, Contact guard assist   General bed mobility comments: increased self ability with intermittent assist for R LE.  Requires increased time to sit EOB before standing to adjust and minimize any feelings of dizziness/nausea.    Transfers Overall transfer level: Needs assistance Equipment used: Rolling walker (2 wheels) Transfers: Sit to/from Stand Sit to Stand: Contact guard assist, Supervision           General transfer comment: 25% VC's on proper hand placment to push up from [facility] vs pull up on walker.  Had Daughter hands on assist Pt while using safety belt.  Instructed on safe handling and caution with increased fall risk during turns and back steps.    Ambulation/Gait Ambulation/Gait assistance: Contact guard assist, Supervision Gait Distance (Feet): 22 Feet Assistive device: Rolling walker (2 wheels) Gait Pattern/deviations: Step-to pattern, Decreased stance time - right, Antalgic, Trunk flexed Gait velocity: decreased     General Gait Details: Had Daughter hands on assist Pt using safety belt amb a functional distance 22 feet to and from potable steps.  Light feeling and Tired appears drained but every session progressing.   Stairs             Wheelchair Mobility     Tilt Bed    Modified Rankin (Stroke Patients Only)       Balance  Communication Communication Communication: No apparent difficulties  Cognition Arousal: Alert Behavior During Therapy: WFL for tasks assessed/performed   PT - Cognitive impairments: No apparent impairments                       PT - Cognition Comments: AxO x 3 sweet Lady with very supportive daughter at bedside. Pt lives home alone and was IND/Driving/shopping.  Daughter plans to stay with her as long as it takes. Following commands: Intact       Cueing Cueing Techniques: Verbal cues  Exercises  03 reps seated TE's  Verbally reviewed all other TE's following HEP hnadout with Daughter    General Comments        Pertinent Vitals/Pain Pain Assessment Pain Assessment: Faces Pain Score: 6  Pain Location: R knee with activity Pain Descriptors / Indicators: Aching, Constant, Operative site guarding Pain Intervention(s): Monitored during session, Premedicated before session, Repositioned, Ice applied    Home Living                          Prior Function            PT Goals (current goals can now be found in the care plan section) Progress towards PT goals: Progressing toward goals    Frequency    7X/week      PT Plan      Co-evaluation              AM-PAC PT 6 Clicks Mobility   Outcome Measure  Help needed turning from your back to your side while in a flat bed without using bedrails?: A Little Help needed moving from lying on your back to sitting on the side of a flat bed without using bedrails?: A Little Help needed moving to and from a bed to a chair (including a wheelchair)?: A Little Help needed standing up from a chair using your arms (e.g., wheelchair or bedside chair)?: A Little Help needed to walk in hospital room?: A Little Help needed climbing 3-5 steps with a railing? : A Little 6 Click Score: 18    End of Session Equipment Utilized During Treatment: Gait belt Activity Tolerance: Patient limited by fatigue Patient left: in bed;with call bell/phone within reach;with family/visitor present Nurse Communication: Mobility status PT Visit Diagnosis: Unsteadiness on feet (R26.81);Other abnormalities of gait and mobility (R26.89);Muscle weakness (generalized) (M62.81);Difficulty in walking, not elsewhere classified (R26.2);Pain Pain - Right/Left: Right     Time: 8893-8865 PT Time Calculation (min) (ACUTE ONLY): 28 min  Charges:    $Gait Training: 8-22 mins $Therapeutic  Activity: 8-22 mins PT General Charges $$ ACUTE PT VISIT: 1 Visit                     Katheryn Leap  PTA Acute  Rehabilitation Services Office M-F          [phone]

## 2023-11-08 NOTE — Progress Notes (Signed)
 Patient discharged to home w/ daughter. Given all belongings, instructions. Both verbalized understanding of instructions. Patient still mildly nauseated which worsened during escort to pov via w/c, but patient states it's just motion sickness and still wants to d/c home.

## 2023-11-08 NOTE — Progress Notes (Signed)
     Subjective:  Patient reports pain as mild.  Did well with physical therapy yesterday ambulating 25 feet though then unable to participate in second session due to emesis.  Some episodes on N/V yesterday, improved with increased dose of zofran .  No N/V this morning and no overnight events.  Patient hopeful to discharge home today pending clearance with physical therapy.  No other concerns.  Yesterday's total administered Morphine  Milligram Equivalents: 22.5   Objective:   VITALS:   Vitals:   11/07/23 1752 11/07/23 1800 11/07/23 2115 11/08/23 0522  BP: (!) 175/61 (!) 175/61 (!) 174/56 (!) 163/64  Pulse: 61  68 71  Resp:   16 16  Temp:   98 F (36.7 C) 98 F (36.7 C)  TempSrc:      SpO2:   97% 95%  Weight:      Height:        Resting in bed in NAD Sensation intact distally Intact pulses distally Dorsiflexion/Plantar flexion intact Incision: dressing C/D/I Compartment soft Wiggles toes appropriately    Lab Results  Component Value Date   WBC 7.9 11/08/2023   HGB 11.4 (L) 11/08/2023   HCT 38.1 11/08/2023   MCV 95.5 11/08/2023   PLT 235 11/08/2023   BMET    Component Value Date/Time   NA 136 11/07/2023 0316   K 4.7 11/07/2023 0316   CL 101 11/07/2023 0316   CO2 26 11/07/2023 0316   GLUCOSE 154 (H) 11/07/2023 0316   BUN 14 11/07/2023 0316   CREATININE 0.69 11/07/2023 0316   CALCIUM 9.0 11/07/2023 0316   GFRNONAA >60 11/07/2023 0316      Xray: Total knee arthroplasty components in good position no adverse features  Assessment/Plan: 2 Days Post-Op   Principal Problem:   Primary osteoarthritis of right knee  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 Edna at [facility].  Address: [address], [place], [place] [address]  Office Phone: [phone]    [name] 11/08/2023, 7:24 AM    Contact information:   Weekdays 7am-5pm epic message Dr. Edna, or call office for patient follow up: [phone] After hours and holidays please check [email or web address] for group call information for [facility]

## 2024-01-18 ENCOUNTER — Other Ambulatory Visit: Payer: Self-pay | Admitting: Neurology

## 2024-02-01 ENCOUNTER — Telehealth: Payer: Self-pay | Admitting: Neurology

## 2024-02-01 MED ORDER — GABAPENTIN 100 MG PO CAPS: 300.0000 mg | ORAL_CAPSULE | Freq: Every day | ORAL | 1 refills | Status: AC

## 2024-02-01 NOTE — Telephone Encounter (Signed)
 Called patient and she informed me that the pharmacy will not let her refill her Rx because it is too soon.   Patient stated she has been taking 3 capsules (300 mg) Gabapentin  at bedtime. She stated she had knee surgery and Dr. Lenora told her to take Gabapentin  300 mg at bedtime. This has caused her to run out of gabapentin .

## 2024-02-01 NOTE — Telephone Encounter (Signed)
 Rx has been updated.

## 2024-02-01 NOTE — Telephone Encounter (Signed)
 Pt needs refill for Gabapentin  and pharmacy is requesting a new script as Pt had knee surgery and was told to take a larger dosage, Pt completely out

## 2024-02-01 NOTE — Telephone Encounter (Signed)
 Called patient and informed her that we have sent in a updated rx. Patient thanked me for the call and had no further questions or concerns.

## 2024-02-01 NOTE — Telephone Encounter (Signed)
 Miriana Gaertner lvm stating she need to get Rx refilled.  PH: [phone]

## 2024-02-01 NOTE — Telephone Encounter (Signed)
 Called patient and informed her that her Gabapentin  was sent to her pharmacy 01/16/24 with an additional refill. Patient stated she will call her pharmacy for a refill.

## 2024-02-01 NOTE — Telephone Encounter (Signed)
 Rx was sent on 1/8 with one refill, which should get her to next visit.

## 2024-02-26 ENCOUNTER — Ambulatory Visit: Payer: Medicare Other | Admitting: Neurology
